# Patient Record
Sex: Female | Born: 1955
Health system: Southern US, Community
[De-identification: ages and names within clinical notes are randomized; demographics above are authoritative.]

## PROBLEM LIST (undated history)

## (undated) DIAGNOSIS — E1149 Type 2 diabetes mellitus with other diabetic neurological complication: Secondary | ICD-10-CM

## (undated) DIAGNOSIS — M65342 Trigger finger, left ring finger: Secondary | ICD-10-CM

## (undated) DIAGNOSIS — Z8489 Family history of other specified conditions: Secondary | ICD-10-CM

## (undated) DIAGNOSIS — Z8042 Family history of malignant neoplasm of prostate: Secondary | ICD-10-CM

## (undated) DIAGNOSIS — K759 Inflammatory liver disease, unspecified: Secondary | ICD-10-CM

## (undated) DIAGNOSIS — T8859XA Other complications of anesthesia, initial encounter: Secondary | ICD-10-CM

## (undated) DIAGNOSIS — D259 Leiomyoma of uterus, unspecified: Secondary | ICD-10-CM

## (undated) DIAGNOSIS — E119 Type 2 diabetes mellitus without complications: Secondary | ICD-10-CM

## (undated) DIAGNOSIS — E785 Hyperlipidemia, unspecified: Secondary | ICD-10-CM

## (undated) DIAGNOSIS — G4730 Sleep apnea, unspecified: Secondary | ICD-10-CM

## (undated) DIAGNOSIS — C801 Malignant (primary) neoplasm, unspecified: Secondary | ICD-10-CM

## (undated) DIAGNOSIS — I10 Essential (primary) hypertension: Secondary | ICD-10-CM

## (undated) DIAGNOSIS — Z808 Family history of malignant neoplasm of other organs or systems: Secondary | ICD-10-CM

## (undated) DIAGNOSIS — M7731 Calcaneal spur, right foot: Secondary | ICD-10-CM

## (undated) DIAGNOSIS — T4145XA Adverse effect of unspecified anesthetic, initial encounter: Secondary | ICD-10-CM

## (undated) DIAGNOSIS — J189 Pneumonia, unspecified organism: Secondary | ICD-10-CM

## (undated) DIAGNOSIS — M1990 Unspecified osteoarthritis, unspecified site: Secondary | ICD-10-CM

## (undated) HISTORY — DX: Unspecified osteoarthritis, unspecified site: M19.90

## (undated) HISTORY — DX: Sleep apnea, unspecified: G47.30

## (undated) HISTORY — DX: Essential (primary) hypertension: I10

## (undated) HISTORY — PX: CERVICAL BIOPSY  W/ LOOP ELECTRODE EXCISION: SUR135

## (undated) HISTORY — DX: Hyperlipidemia, unspecified: E78.5

## (undated) HISTORY — DX: Type 2 diabetes mellitus with other diabetic neurological complication: E11.49

## (undated) HISTORY — DX: Family history of malignant neoplasm of other organs or systems: Z80.8

## (undated) HISTORY — DX: Family history of malignant neoplasm of prostate: Z80.42

## (undated) HISTORY — DX: Morbid (severe) obesity due to excess calories: E66.01

---

## 1994-08-29 HISTORY — PX: TUBAL LIGATION: SHX77

## 2001-11-30 ENCOUNTER — Ambulatory Visit: Admission: RE | Admit: 2001-11-30 | Discharge: 2001-11-30 | Payer: Self-pay | Admitting: *Deleted

## 2001-12-13 ENCOUNTER — Encounter: Admission: RE | Admit: 2001-12-13 | Discharge: 2002-03-13 | Payer: Self-pay | Admitting: Pulmonary Disease

## 2003-04-16 ENCOUNTER — Ambulatory Visit (HOSPITAL_COMMUNITY)
Admission: RE | Admit: 2003-04-16 | Discharge: 2003-04-16 | Payer: Self-pay | Admitting: Rehabilitative and Restorative Service Providers"

## 2003-04-20 ENCOUNTER — Ambulatory Visit: Admission: RE | Admit: 2003-04-20 | Discharge: 2003-04-20 | Payer: Self-pay | Admitting: *Deleted

## 2004-11-19 ENCOUNTER — Ambulatory Visit: Payer: Self-pay | Admitting: Internal Medicine

## 2004-11-19 HISTORY — PX: ELECTROCARDIOGRAM: SHX264

## 2005-02-11 ENCOUNTER — Ambulatory Visit: Payer: Self-pay | Admitting: Internal Medicine

## 2005-02-18 ENCOUNTER — Ambulatory Visit: Payer: Self-pay | Admitting: Internal Medicine

## 2005-03-21 ENCOUNTER — Ambulatory Visit: Payer: Self-pay | Admitting: Endocrinology

## 2005-03-22 ENCOUNTER — Encounter: Admission: RE | Admit: 2005-03-22 | Discharge: 2005-06-20 | Payer: Self-pay | Admitting: Endocrinology

## 2005-04-21 ENCOUNTER — Ambulatory Visit: Payer: Self-pay | Admitting: Endocrinology

## 2005-05-04 ENCOUNTER — Ambulatory Visit: Payer: Self-pay | Admitting: Endocrinology

## 2005-05-18 ENCOUNTER — Ambulatory Visit: Payer: Self-pay | Admitting: Endocrinology

## 2005-05-18 ENCOUNTER — Ambulatory Visit: Payer: Self-pay | Admitting: Internal Medicine

## 2005-06-06 ENCOUNTER — Ambulatory Visit: Payer: Self-pay | Admitting: Endocrinology

## 2005-07-01 ENCOUNTER — Ambulatory Visit: Payer: Self-pay | Admitting: Endocrinology

## 2005-07-06 ENCOUNTER — Ambulatory Visit: Payer: Self-pay | Admitting: Endocrinology

## 2005-08-17 ENCOUNTER — Ambulatory Visit: Payer: Self-pay | Admitting: Internal Medicine

## 2005-08-30 ENCOUNTER — Ambulatory Visit: Payer: Self-pay | Admitting: Internal Medicine

## 2005-09-30 ENCOUNTER — Ambulatory Visit: Payer: Self-pay | Admitting: Internal Medicine

## 2005-10-05 ENCOUNTER — Ambulatory Visit: Payer: Self-pay | Admitting: Endocrinology

## 2006-01-18 ENCOUNTER — Ambulatory Visit: Payer: Self-pay | Admitting: Internal Medicine

## 2006-01-31 ENCOUNTER — Ambulatory Visit: Payer: Self-pay | Admitting: Endocrinology

## 2006-02-06 ENCOUNTER — Ambulatory Visit: Payer: Self-pay | Admitting: Endocrinology

## 2006-04-21 ENCOUNTER — Ambulatory Visit: Payer: Self-pay | Admitting: Internal Medicine

## 2006-04-26 ENCOUNTER — Ambulatory Visit: Payer: Self-pay | Admitting: Internal Medicine

## 2006-05-17 ENCOUNTER — Ambulatory Visit: Payer: Self-pay | Admitting: Endocrinology

## 2006-06-16 ENCOUNTER — Ambulatory Visit: Payer: Self-pay | Admitting: Endocrinology

## 2006-07-13 ENCOUNTER — Ambulatory Visit: Payer: Self-pay | Admitting: Endocrinology

## 2006-07-13 LAB — CONVERTED CEMR LAB
Creatinine,U: 167.4 mg/dL
Hgb A1c MFr Bld: 7.9 % — ABNORMAL HIGH (ref 4.6–6.0)
Microalb Creat Ratio: 2.4 mg/g (ref 0.0–30.0)
Microalb, Ur: 0.4 mg/dL (ref 0.0–1.9)

## 2006-12-11 ENCOUNTER — Ambulatory Visit: Payer: Self-pay | Admitting: Endocrinology

## 2006-12-11 LAB — CONVERTED CEMR LAB: Hgb A1c MFr Bld: 8 % — ABNORMAL HIGH (ref 4.6–6.0)

## 2007-01-30 ENCOUNTER — Ambulatory Visit: Payer: Self-pay | Admitting: Endocrinology

## 2007-02-28 ENCOUNTER — Encounter: Payer: Self-pay | Admitting: Endocrinology

## 2007-02-28 DIAGNOSIS — E1149 Type 2 diabetes mellitus with other diabetic neurological complication: Secondary | ICD-10-CM

## 2007-02-28 DIAGNOSIS — E785 Hyperlipidemia, unspecified: Secondary | ICD-10-CM

## 2007-02-28 DIAGNOSIS — I10 Essential (primary) hypertension: Secondary | ICD-10-CM

## 2007-02-28 DIAGNOSIS — E119 Type 2 diabetes mellitus without complications: Secondary | ICD-10-CM | POA: Insufficient documentation

## 2007-02-28 DIAGNOSIS — G4730 Sleep apnea, unspecified: Secondary | ICD-10-CM

## 2007-02-28 HISTORY — DX: Hyperlipidemia, unspecified: E78.5

## 2007-02-28 HISTORY — DX: Type 2 diabetes mellitus with other diabetic neurological complication: E11.49

## 2007-02-28 HISTORY — DX: Essential (primary) hypertension: I10

## 2007-02-28 HISTORY — DX: Morbid (severe) obesity due to excess calories: E66.01

## 2007-02-28 HISTORY — DX: Sleep apnea, unspecified: G47.30

## 2007-03-13 ENCOUNTER — Ambulatory Visit: Payer: Self-pay | Admitting: Endocrinology

## 2007-03-13 LAB — CONVERTED CEMR LAB
Hgb A1c MFr Bld: 7.9 % — ABNORMAL HIGH (ref 4.6–6.0)
Sed Rate: 24 mm/hr (ref 0–25)

## 2007-06-12 ENCOUNTER — Ambulatory Visit: Payer: Self-pay | Admitting: Endocrinology

## 2007-06-12 ENCOUNTER — Encounter: Payer: Self-pay | Admitting: Endocrinology

## 2007-06-12 LAB — CONVERTED CEMR LAB
Hgb A1c MFr Bld: 7.5 % — ABNORMAL HIGH (ref 4.6–6.0)
Sed Rate: 42 mm/hr — ABNORMAL HIGH (ref 0–25)

## 2007-08-20 ENCOUNTER — Encounter: Payer: Self-pay | Admitting: Endocrinology

## 2007-08-30 HISTORY — PX: DILATION AND CURETTAGE OF UTERUS: SHX78

## 2007-10-15 ENCOUNTER — Ambulatory Visit: Payer: Self-pay | Admitting: Endocrinology

## 2007-10-15 LAB — CONVERTED CEMR LAB: Hgb A1c MFr Bld: 8 % — ABNORMAL HIGH (ref 4.6–6.0)

## 2008-01-08 ENCOUNTER — Ambulatory Visit: Payer: Self-pay | Admitting: Endocrinology

## 2008-01-08 LAB — CONVERTED CEMR LAB: Hgb A1c MFr Bld: 7.6 % — ABNORMAL HIGH (ref 4.6–6.0)

## 2008-03-12 ENCOUNTER — Ambulatory Visit: Payer: Self-pay | Admitting: Endocrinology

## 2008-06-10 ENCOUNTER — Ambulatory Visit: Payer: Self-pay | Admitting: Endocrinology

## 2008-06-17 ENCOUNTER — Ambulatory Visit: Admission: AD | Admit: 2008-06-17 | Discharge: 2008-06-17 | Payer: Self-pay | Admitting: Obstetrics & Gynecology

## 2008-06-17 ENCOUNTER — Encounter (INDEPENDENT_AMBULATORY_CARE_PROVIDER_SITE_OTHER): Payer: Self-pay | Admitting: Obstetrics & Gynecology

## 2009-04-24 ENCOUNTER — Telehealth: Payer: Self-pay | Admitting: Endocrinology

## 2009-04-28 ENCOUNTER — Telehealth: Payer: Self-pay | Admitting: Internal Medicine

## 2009-05-21 ENCOUNTER — Telehealth: Payer: Self-pay | Admitting: Endocrinology

## 2009-05-28 ENCOUNTER — Ambulatory Visit: Payer: Self-pay | Admitting: Endocrinology

## 2009-05-29 LAB — CONVERTED CEMR LAB
ALT: 15 units/L (ref 0–35)
AST: 16 units/L (ref 0–37)
Albumin: 3.6 g/dL (ref 3.5–5.2)
Alkaline Phosphatase: 92 units/L (ref 39–117)
BUN: 21 mg/dL (ref 6–23)
Bilirubin, Direct: 0.1 mg/dL (ref 0.0–0.3)
CO2: 31 meq/L (ref 19–32)
Calcium: 10.5 mg/dL (ref 8.4–10.5)
Chloride: 101 meq/L (ref 96–112)
Cholesterol: 127 mg/dL (ref 0–200)
Creatinine, Ser: 1.1 mg/dL (ref 0.4–1.2)
GFR calc non Af Amer: 55.33 mL/min (ref >60)
Glucose, Bld: 199 mg/dL — ABNORMAL HIGH (ref 70–99)
HDL: 34.8 mg/dL — ABNORMAL LOW (ref >39.00)
Hgb A1c MFr Bld: 8.5 % — ABNORMAL HIGH (ref 4.6–6.5)
LDL Cholesterol: 76 mg/dL (ref 0–99)
Potassium: 3.8 meq/L (ref 3.5–5.1)
Sodium: 138 meq/L (ref 135–145)
Total Bilirubin: 0.4 mg/dL (ref 0.3–1.2)
Total CHOL/HDL Ratio: 4
Total Protein: 7.3 g/dL (ref 6.0–8.3)
Triglycerides: 80 mg/dL (ref 0.0–149.0)
VLDL: 16 mg/dL (ref 0.0–40.0)

## 2009-06-11 ENCOUNTER — Telehealth: Payer: Self-pay | Admitting: Endocrinology

## 2010-06-17 ENCOUNTER — Ambulatory Visit: Payer: Self-pay | Admitting: Endocrinology

## 2010-08-12 ENCOUNTER — Telehealth: Payer: Self-pay | Admitting: Endocrinology

## 2010-09-17 ENCOUNTER — Ambulatory Visit
Admission: RE | Admit: 2010-09-17 | Discharge: 2010-09-17 | Payer: Self-pay | Source: Home / Self Care | Attending: Endocrinology | Admitting: Endocrinology

## 2010-09-17 ENCOUNTER — Other Ambulatory Visit: Payer: Self-pay | Admitting: Endocrinology

## 2010-09-17 LAB — HEMOGLOBIN A1C: Hgb A1c MFr Bld: 7.5 % — ABNORMAL HIGH (ref 4.6–6.5)

## 2010-09-20 ENCOUNTER — Other Ambulatory Visit: Payer: Self-pay | Admitting: Obstetrics and Gynecology

## 2010-09-28 NOTE — Assessment & Plan Note (Signed)
Summary: 3 MTH FU---$50-STC   Vital Signs:  Patient Profile:   55 Years Old Female Weight:      351.8 pounds O2 Sat:      96 % O2 treatment:    Room Air Temp:     97.7 degrees F oral Pulse rate:   861 / minute BP sitting:   122 / 72  (left arm) Cuff size:   large  Pt. in pain?   no  Vitals Entered By: Orlan Leavens (June 10, 2008 9:13 AM)                  PCP:  [facility]nikov  Chief Complaint:  3 month follow-up.  History of Present Illness: pt says she feels occasionally as though her glucose is low, but not sure, in the afternoon.  she brings a record of her cbg's which i have reviewed today.  it ranges from 100-137.  no particular pattern throughout the day.    Prior Medications Reviewed Using: List Brought by Patient  Prior Medication List:  DILT-CD 180 MG  CP24 (DILTIAZEM HCL COATED BEADS) take 1 po ZOCOR 40 MG  TABS (SIMVASTATIN) take 1 po ZESTORETIC 20-12.5 MG  TABS (LISINOPRIL-HYDROCHLOROTHIAZIDE) TAKE 2 PO ADPRIN B 325 MG  TABS (ASPIRIN BUF(CACARB-MGCARB-MGO)) TAKE 1 PO DARVOCET-N 100   TABS (PROPOXYPHENE N-APAP TABS) TAKE 1 QID AS NEEDED NAPROXEN DR 500 MG  TBEC (NAPROXEN) TAKE 1 two times a day AS NEEDED NOVOLIN 70/30 70-30 %  SUSP (INSULIN ISOPHANE & REGULAR) 100 units qam  40 qpm   Updated Prior Medication List: DILT-CD 180 MG  CP24 (DILTIAZEM HCL COATED BEADS) take 1 po ZOCOR 40 MG  TABS (SIMVASTATIN) take 1 po ZESTORETIC 20-12.5 MG  TABS (LISINOPRIL-HYDROCHLOROTHIAZIDE) TAKE 2 PO ADPRIN B 325 MG  TABS (ASPIRIN BUF(CACARB-MGCARB-MGO)) TAKE 1 PO DARVOCET-N 100   TABS (PROPOXYPHENE N-APAP TABS) TAKE 1 QID AS NEEDED NAPROXEN DR 500 MG  TBEC (NAPROXEN) TAKE 1 two times a day AS NEEDED NOVOLIN 70/30 70-30 %  SUSP (INSULIN ISOPHANE & REGULAR) 100 units qam  40 qpm  Current Allergies: No known allergies   Past Medical History:    Reviewed history from 02/28/2007 and no changes required:       Diabetes mellitus, type I       Hypertension     Review of  Systems  The patient denies syncope.     Physical Exam  General:     obese.   Neck:     no masses, thyromegaly, or abnormal cervical nodes    Impression & Recommendations:  Problem # 1:  DIABETES MELLITUS, TYPE I (ICD-250.01)   Patient Instructions: 1)  a1c refused due to no health insurance. 2)  same rx 3)  ret 3 mos   Prescriptions: ZOCOR 40 MG  TABS (SIMVASTATIN) take 1 po  #90 x 3   Entered and Authorized by:   Minus Breeding MD   Signed by:   Minus Breeding MD on 06/10/2008   Method used:   Print then Give to Patient   RxID:   [id number]  ]

## 2010-09-28 NOTE — Progress Notes (Signed)
  Phone Note Refill Request Message from:  Fax from Pharmacy on April 24, 2009 4:22 PM  Refills Requested: Medication #1:  DILT-CD 180 MG  CP24 take 1 po Initial call taken by: Ami Bullins CMA,  April 24, 2009 4:22 PM    Prescriptions: DILT-CD 180 MG  CP24 (DILTIAZEM HCL COATED BEADS) take 1 po  #180 Each x 5   Entered by:   Ami Bullins CMA   Authorized by:   Minus Breeding MD   Signed by:   Bill Salinas CMA on 04/24/2009   Method used:   Electronically to        [facility] [phone]* (retail)       [address]       [place], [place]  [address]       Ph: [phone]       Fax: [phone]   RxID:   [id number]

## 2010-09-28 NOTE — Miscellaneous (Signed)
Summary: HUMALIN N   Clinical Lists Changes  Medications: Rx of HUMULIN N 100 UNIT/ML  SUSP (INSULIN ISOPHANE HUMAN) SEE OFFICE NOTES;  #15 [place] x 6;  Signed;  Entered by: Orlan Leavens;  Authorized by: Minus Breeding MD;  Method used: Electronic    Prescriptions: HUMULIN N 100 UNIT/ML  SUSP (INSULIN ISOPHANE HUMAN) SEE OFFICE NOTES  #15 [place] x 6   Entered by:   Orlan Leavens   Authorized by:   Minus Breeding MD   Signed by:   Orlan Leavens on 08/20/2007   Method used:   Electron[facility] sent to ...       [facility]  [address] [id number]       [address]       [place], [place]  [address]       Ph: [phone] or [phone]       Fax: [phone]   RxID:   [id number]

## 2010-09-28 NOTE — Assessment & Plan Note (Signed)
Summary: 3 MO ROV/NWS   Vital Signs:  Patient Profile:   55 Years Old Female Weight:      347.8 pounds Temp:     97.0 degrees F oral Pulse rate:   87 / minute BP sitting:   142 / 82  (right arm) Cuff size:   large  Vitals Entered By: Orlan Leavens (Jan 08, 2008 9:11 AM)                 PCP:  plotnikov  Chief Complaint:  3 MONTH FOLLOW-UP.  History of Present Illness: pt states hypoglycemia at hs and afternoon, so she reduced insulin to previous dosage.  says her lowest glucose was 58.  no cbg record.  is soon to be laid off from her job, so wants to change to cheaper insulin then.    Current Allergies: No known allergies   Past Medical History:    Reviewed history from 02/28/2007 and no changes required:       Diabetes mellitus, type I       Hypertension     Review of Systems  The patient denies syncope.     Physical Exam  General:     normal appearance.   Psych:     alert and cooperative; normal mood and affect; normal attention span and concentration Additional Exam:     A1C%                 [H]  7.6 %       Impression & Recommendations:  Problem # 1:  DIABETES MELLITUS, TYPE I (ICD-250.01) therapy limited by pt's request for least expensive meds  The following medications were removed from the medication list:    Humalog 100 Unit/ml Soln (Insulin lispro (human)) ..... See office notes    Humulin N 100 Unit/ml Susp (Insulin isophane human) ..... See office notes  Her updated medication list for this problem includes:    Novolin 70/30 70-30 % Susp (Insulin isophane & regular) .[name]... 90 units qam  30 qpm  Orders: TLB-A1C / Hgb A1C (Glycohemoglobin) (83036-A1C) Est. Patient Level III (32440)   Medications Added to Medication List This Visit: 1)  Novolin 70/30 70-30 % Susp (Insulin isophane & regular) .... 90 units qam  30 qpm   Patient Instructions: 1)  change both insulins to novolin 70/30 90 qam and 30 qpm 2)  ret  30d   Prescriptions: NOVOLIN 70/30 70-30 %  SUSP (INSULIN ISOPHANE & REGULAR) 90 units qam  30 qpm  #90 x 30   Entered and Authorized by:   Minus Breeding MD   Signed by:   Minus Breeding MD on 01/08/2008   Method used:   Electronically sent to ...       [facility]  [address] [id number]       [address]       [place], [place]  [address]       Ph: [phone] or [phone]       Fax: [phone]   RxID:   [id number]  ]

## 2010-09-28 NOTE — Assessment & Plan Note (Signed)
Summary: 1 MO FU/$50/PN   Vital Signs:  Patient Profile:   55 Years Old Female Weight:      350.0 pounds Temp:     97.3 degrees F oral Pulse rate:   82 / minute BP sitting:   131 / 71  (left arm) Cuff size:   large  Pt. in pain?   no  Vitals Entered By: Orlan Leavens (March 12, 2008 8:55 AM)                  PCP:  plotnikov  Chief Complaint:  1 month follow-up.  History of Present Illness: pt likes her new insulin.  cbg's are alomost all mid-100's, with no trend throughout the day. ffels well, except for economic difficulty.    Current Allergies: No known allergies   Past Medical History:    Reviewed history from 02/28/2007 and no changes required:       Diabetes mellitus, type I       Hypertension     Review of Systems  The patient denies hypoglycemia.     Physical Exam  General:     obese.   Extremities:     trace left pedal edema and trace right pedal edema.      Impression & Recommendations:  Problem # 1:  DIABETES MELLITUS, TYPE I (ICD-250.01)  Medications Added to Medication List This Visit: 1)  Novolin 70/30 70-30 % Susp (Insulin isophane & regular) .[name].. 100 units qam  40 qpm   Patient Instructions: 1)  increase novolin 70/30 to 100 am and 40 pm 2)  ret 3 mos   Prescriptions: NOVOLIN 70/30 70-30 %  SUSP (INSULIN ISOPHANE & REGULAR) 100 units qam  40 qpm  #5 x 11   Entered and Authorized by:   Minus Breeding MD   Signed by:   Minus Breeding MD on 03/12/2008   Method used:   Electronically sent to ...       [facility]  [address] [id number]       [address]       [place], [place]  [address]       Ph: [phone] or [phone]       Fax: [phone]   RxID:   [id number]  ]

## 2010-09-28 NOTE — Assessment & Plan Note (Signed)
Summary: FU---MED---STC   Vital Signs:  Patient profile:   55 year old female Height:      65 inches (165.10 cm) Weight:      342.25 pounds (155.57 kg) BMI:     57.16 O2 Sat:      98 % on Room air Temp:     97.9 degrees F (36.61 degrees C) oral Pulse rate:   70 / minute BP sitting:   130 / 72  (left arm) Cuff size:   large  Vitals Entered By: Brenton Grills MA (June 17, 2010 8:08 AM)  O2 Flow:  Room air CC: Follow up to refill meds/aj Is Patient Diabetic? Yes   Primary Provider:  plotnikov  CC:  Follow up to refill meds/aj.  History of Present Illness: the status of at least 3 ongoing medical problems is addressed today: dyslipidemia:  she tolerates zocor well, but she now has insurance, and says she can afford medication.  there is an interaction between zocor and diltiazem. htn:  pt states she feels well in general.  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  she has hypoglycemia approx 1/month, and these episodes are mild.  she says cbg's are mostly in the 100's.  it is higher in am than later in the day.   Current Medications (verified): 1)  Zocor 40 Mg  Tabs (Simvastatin) .... Take 1 Po 2)  Zestoretic 20-12.5 Mg  Tabs (Lisinopril-Hydrochlorothiazide) .... Take 2 Po 3)  Adprin B 325 Mg  Tabs (Aspirin Buf(Cacarb-Mgcarb-Mgo)) .... Take 1 Po 4)  Relion 70/30 70-30 % Susp (Insulin Isophane & Regular) .[name].. 110 Units Am and 50 Units Pm 5)  Diltiazem Hcl 120 Mg Tabs (Diltiazem Hcl) .[name].. 1 Tid  Allergies (verified): No Known Drug Allergies  Past History:  Past Medical History: Last updated: 05/28/2009 Diabetes mellitus, type I Hypertension Current Problems:  MORBID OBESITY (ICD-278.01) SLEEP APNEA (ICD-780.57) DIABETIC PERIPHERAL NEUROPATHY (ICD-250.60) DYSLIPIDEMIA (ICD-272.4) HYPERTENSION (ICD-401.9) DIABETES MELLITUS, TYPE I (ICD-250.01)  Review of Systems  The patient denies weight loss, weight gain, chest pain, and  dyspnea on exertion.    Physical Exam  General:  morbidly obese.  no distress  Pulses:  dorsalis pedis intact bilat.  Extremities:  no deformity.  no ulcer on the feet.  feet are of normal color and temp.  no edema  Neurologic:  sensation is intact to touch on the feet    Impression & Recommendations:  Problem # 1:  DIABETES MELLITUS, TYPE I (ICD-250.01) uncertain control  Problem # 2:  HYPERTENSION (ICD-401.9) well-controlled  Problem # 3:  DYSLIPIDEMIA (ICD-272.4) she can take a safer med now  Medications Added to Medication List This Visit: 1)  Zestoretic 20-12.5 Mg Tabs (Lisinopril-hydrochlorothiazide) .... Take 2 once daily 2)  Lipitor 40 Mg Tabs (Atorvastatin calcium) .[name].. 1 tab once daily 3)  Diltiazem Hcl Er Beads 300 Mg Xr24h-cap (Diltiazem hcl er beads) .[name].. 1 tab once daily  Other Orders: Est. Patient Level IV (21308)  Patient Instructions: 1)  check your blood sugar 2 times a day.  vary the time of day when you check, between before the 3 meals, and at bedtime.  also check if you have symptoms of your blood sugar being too high or too low.  please keep a record of the readings and bring it to your next appointment here.  please call us sooner if you are having low blood sugar episodes. 2)  tests are being ordered  for you today.  a few days after the test(s), please call [phone] to hear your test results. 3)  pending the test results, please continue the same medications for now 4)  Please schedule a follow-up appointment in 3 months. 5)  change simvastatin to lipitor 40 mg once daily 6)  you should also see dr Posey Rea for a physical soon.  7)  change diltiazem to -xr, 300 mg once daily.   Orders Added: 1)  Est. Patient Level IV [78295]

## 2010-09-28 NOTE — Progress Notes (Signed)
Summary: Diltizem  Phone Note From Pharmacy   Summary of Call: Pharm called about pts diltizem. Previously pt has been on 1 two times a day. Rx was sent in incomplete. Please advise, 1 two times a day?  Initial call taken by: Lamar Sprinkles, CMA,  April 28, 2009 10:34 AM  Follow-up for Phone Call        Pls ask the pt - if she was on two times a day - cont bid Follow-up by: Tresa Garter MD,  April 28, 2009 1:04 PM  Additional Follow-up for Phone Call Additional follow up Details #1::        For the record, this was the first call from the pharm with a question about med. Pt recieved rx today but is said 1 once daily #30. She has been taking med 1 two times a day. New rx sent into pharm, Pt informed to continue dose she was taking Additional Follow-up by: Lamar Sprinkles, CMA,  April 28, 2009 6:08 PM    New/Updated Medications: DILT-CD 180 MG  CP24 (DILTIAZEM HCL COATED BEADS) take 1 by mouth bid Prescriptions: DILT-CD 180 MG  CP24 (DILTIAZEM HCL COATED BEADS) take 1 by mouth bid  #180 x 0   Entered by:   Lamar Sprinkles, CMA   Authorized by:   Tresa Garter MD   Signed by:   Lamar Sprinkles, CMA on 04/28/2009   Method used:   Electronically to        [facility] [phone]* (retail)       [address]       [place], [place]  [address]       Ph: [phone]       Fax: [phone]   RxID:   [id number]

## 2010-09-28 NOTE — Assessment & Plan Note (Signed)
Summary: INSULIN BEING CHANGED-ROV-STC   Vital Signs:  Patient profile:   55 year old female Height:      65 inches Weight:      342 pounds BMI:     57.12 O2 Sat:      97 % on Room air Temp:     98.2 degrees F oral Pulse rate:   96 / minute BP sitting:   122 / 88  (left arm) Cuff size:   large  Vitals Entered By: Bill Salinas CMA (May 28, 2009 8:36 AM)  O2 Flow:  Room air CC: Stacey Wise here to discuss change in insulin per her pharm/ Stacey Wise no longer taking Darvocet or Naproxen/ ab   Primary Provider:  plotnikov  CC:  Stacey Wise here to discuss change in insulin per her pharm/ Stacey Wise no longer taking Darvocet or Naproxen/ ab.  History of Present Illness: she takes zocor as rx'ed no cbg record, but states cbg's are well-controlled. she takes diltiazem and zestoretic as rx'ed   Current Medications (verified): 1)  Dilt-Cd 180 Mg  Cp24 (Diltiazem Hcl Coated Beads) .... Take 1 By Mouth Bid 2)  Zocor 40 Mg  Tabs (Simvastatin) .... Take 1 Po 3)  Zestoretic 20-12.5 Mg  Tabs (Lisinopril-Hydrochlorothiazide) .... Take 2 Po 4)  Adprin B 325 Mg  Tabs (Aspirin Buf(Cacarb-Mgcarb-Mgo)) .... Take 1 Po 5)  Darvocet-N 100   Tabs (Propoxyphene N-Apap Tabs) .... Take 1 Qid As Needed 6)  Naproxen Dr 500 Mg  Tbec (Naproxen) .... Take 1 Two Times A Day As Needed 7)  Novolin 70/30 70-30 %  Susp (Insulin Isophane & Regular) .[name].. 100 Units Qam  40 Qpm  Allergies (verified): No Known Drug Allergies  Past History:  Past Medical History: Diabetes mellitus, type I Hypertension Current Problems:  MORBID OBESITY (ICD-278.01) SLEEP APNEA (ICD-780.57) DIABETIC PERIPHERAL NEUROPATHY (ICD-250.60) DYSLIPIDEMIA (ICD-272.4) HYPERTENSION (ICD-401.9) DIABETES MELLITUS, TYPE I (ICD-250.01)  Family History: Reviewed history and no changes required. no dm  Social History: Reviewed history and no changes required. unemployed single.  Review of Systems  The patient denies weight loss, weight gain, and chest pain.     Physical Exam  General:  morbidly obese.  no distress  Pulses:  dorsalis pedis intact bilat.  Extremities:  no deformity.  no ulcer on the feet.  feet are of normal color and temp.  no edema  Neurologic:  sensation is intact to touch on the feet  Additional Exam:  LDL Cholesterol           76 mg/dL Hemoglobin J8J       [H]  8.5 %   Impression & Recommendations:  Problem # 1:  DIABETES MELLITUS, TYPE I (ICD-250.01) needs increased rx  Problem # 2:  HYPERTENSION (ICD-401.9) well-controlled  Problem # 3:  DYSLIPIDEMIA (ICD-272.4) well-controlled  Medications Added to Medication List This Visit: 1)  Relion 70/30 70-30 % Susp (Insulin isophane & regular) .[name].. 100 units am and 40 units pm 2)  Relion 70/30 70-30 % Susp (Insulin isophane & regular) .[name].. 110 units am and 50 units pm 3)  Diltiazem Hcl 120 Mg Tabs (Diltiazem hcl) .[name].. 1 tid  Other Orders: Est. Patient Level III (19147) TLB-Lipid Panel (80061-LIPID) TLB-Hepatic/Liver Function Pnl (80076-HEPATIC) TLB-BMP (Basic Metabolic Panel-BMET) (80048-METABOL) TLB-A1C / Hgb A1C (Glycohemoglobin) (83036-A1C) Est. Patient Level III (82956)  Patient Instructions: 1)  check your blood sugar every other day.  vary the time of day when you check, between before the 3 meals, and at bedtime.  also  check if you have symptoms of your blood sugar being too high or too low.  please keep a record of the readings and bring it to your next appointment here.  please call us sooner if you are having low blood sugar episodes. 2)  change diltiazem to 120 mg three times a day 3)  otherwise same meds for now. 4)  tests are being ordered for you today.  a few days after the test(s), please call [phone] to hear your test results. 5)  i gave written rx's for all meds, to let her shop for the best price 6)  i told Stacey Wise that important health measures (such as a cardiogram) are being skipped in order to save her money.  please let me know if you decide  to have these done. 7)  Please schedule a follow-up appointment in 3 months. 8)  (update: increase insulin to 110 units am and 50 units pm) Prescriptions: DILTIAZEM HCL 120 MG TABS (DILTIAZEM HCL) 1 tid  #270 x 3   Entered and Authorized by:   Minus Breeding MD   Signed by:   Minus Breeding MD on 05/28/2009   Method used:   Print then Give to Patient   RxID:   [id number] ZESTORETIC 20-12.5 MG  TABS (LISINOPRIL-HYDROCHLOROTHIAZIDE) TAKE 2 PO  #180 x 3   Entered and Authorized by:   Minus Breeding MD   Signed by:   Minus Breeding MD on 05/28/2009   Method used:   Print then Give to Patient   RxID:   [id number] ZOCOR 40 MG  TABS (SIMVASTATIN) take 1 po  #90 x 3   Entered and Authorized by:   Minus Breeding MD   Signed by:   Minus Breeding MD on 05/28/2009   Method used:   Print then Give to Patient   RxID:   [id number] RELION 70/30 70-30 % SUSP (INSULIN ISOPHANE & REGULAR) 100 units am and 40 units pm  #5 vials x 11   Entered and Authorized by:   Minus Breeding MD   Signed by:   Minus Breeding MD on 05/28/2009   Method used:   Print then Give to Patient   RxID:   [id number]

## 2010-09-28 NOTE — Assessment & Plan Note (Signed)
Summary: 4 MO ROV/NWS      Allergies Added: NKDA  Vital Signs:  Patient Profile:   55 Years Old Female Weight:      349.6 pounds Temp:     99.0 degrees F oral Pulse rate:   78 / minute BP sitting:   122 / 66  (right arm) Cuff size:   large  Vitals Entered By: Orlan Leavens (October 15, 2007 9:25 AM)                 Visit Type:  Follow-up Visit PCP:  plotnikov  Chief Complaint:  dm.  History of Present Illness: pt states she takes insulin as advised.  cbg's are highest before lunch.  no cbg record.    Current Allergies: No known allergies   Past Medical History:    Reviewed history from 02/28/2007 and no changes required:       Diabetes mellitus, type I       Hypertension     Review of Systems       denies hypoglycemia   Physical Exam  Skin:     insulin injection sites (and abdomen) normal Additional Exam:     a1c=8.0    Impression & Recommendations:  Problem # 1:  DIABETES MELLITUS, TYPE I (ICD-250.01)  Her updated medication list for this problem includes:    Humalog 100 Unit/ml Soln (Insulin lispro (human)) ..... See office notes    Humulin N 100 Unit/ml Susp (Insulin isophane human) ..... See office notes  Orders: TLB-A1C / Hgb A1C (Glycohemoglobin) (83036-A1C) Est. Patient Level III (04540)    Patient Instructions: 1)  same nph (30-qhs) 2)  increase qac humalog to 65-15-40 3)  ret 3 mos    ]

## 2010-09-28 NOTE — Progress Notes (Signed)
Summary: A1C  Phone Note Call from Patient Call back at Home Phone [phone]   Caller: Patient Summary of Call: pt called stating that pharmacy is switching insulin due to insurance and pt insurance requires A1C before they change. pt states that she cannot afford OV at this time Initial call taken by: Margaret Pyle, CMA,  May 21, 2009 9:40 AM  Follow-up for Phone Call        other than refilling insulin (which was done a few weeks ago) there is nothing i can do without office visit.  also, we cannot continue to refill without office visit. Follow-up by: Minus Breeding MD,  May 21, 2009 12:06 PM  Additional Follow-up for Phone Call Additional follow up Details #1::        pt is informed Additional Follow-up by: Margaret Pyle, CMA,  May 21, 2009 1:21 PM

## 2010-09-28 NOTE — Progress Notes (Signed)
Summary: rx request  Phone Note Call from Patient Call back at Home Phone [phone]   Caller: Patient Summary of Call: pt called request rx for insulin be called into pharmacy. MD change dose on last rx given so pt need new rx. Initial call taken by: Margaret Pyle, CMA,  June 11, 2009 2:24 PM    Prescriptions: RELION 70/30 70-30 % SUSP (INSULIN ISOPHANE & REGULAR) 110 units am and 50 units pm  #5 vials x 11   Entered by:   Margaret Pyle, CMA   Authorized by:   Minus Breeding MD   Signed by:   Margaret Pyle, CMA on 06/11/2009   Method used:   Electronically to        [facility] [address] (retail)       [phone]       [place], [place]  [address]       Ph: [phone]       Fax: [phone]   RxID:   [id number]

## 2010-09-28 NOTE — Assessment & Plan Note (Signed)
    Vital Signs:  Patient Profile:   55 Years Old Female Weight:      346 pounds Temp:     97.9 degrees F Pulse rate:   84 / minute BP sitting:   119 / 71                 Visit Type:  f/u PCP:  plotnikov  Chief Complaint:  dm.  History of Present Illness: patient states her glucoses continue to improve.  They are lowest in the afternoon, and sometimes at bed time.  She feels that this is contributed to by the fact that she works second shift and gets only a very small break to the dinner.  Her glucoses are highest in the morning, which may be due to eating a light snack at bedtime.she says her glucoses her highest between breakfast and lunch.   Vital Signs:  Patient Profile:   55 Years Old Female Weight:      346 pounds Temp:     97.9 degrees F Pulse rate:   84 / minute BP sitting:   119 / 71   Current Allergies: No known allergies   Past Medical History:    Reviewed history from 02/28/2007 and no changes required:       Diabetes mellitus, type I       Hypertension      Physical Exam  General:     obese.   Extremities:     no clubbing, cyanosis, edema, or deformity noted with normal full range of motion of all joints trace left pedal edema and trace right pedal edema.  she has onychomycosis on both great toenails     Impression & Recommendations:  Problem # 1:  DIABETES MELLITUS, TYPE I (ICD-250.01)  Her updated medication list for this problem includes:    Humalog 100 Unit/ml Soln (Insulin lispro (human)) ..... See office notes    Humulin N 100 Unit/ml Susp (Insulin isophane human) ..... See office notes HgbA1C: 7.9 (03/13/2007) a1c=7.5 (06/12/07)   Patient Instructions: 1)  increase nph to 30 at bedtime 2)  (as an alternative, you may take 5 units humalog with hs snack, if you choose to eat one) 3)  same qac humalog (55-15-40) 4)  Please schedule a follow-up appointment in 3 months.    ]

## 2010-09-30 NOTE — Progress Notes (Signed)
Summary: Rx refill req  Phone Note Refill Request Message from:  Patient on August 12, 2010 3:41 PM  Refills Requested: Medication #1:  LIPITOR 40 MG TABS 1 tab once daily   Dosage confirmed as above?Dosage Confirmed   Supply Requested: 6 months  Method Requested: Electronic Initial call taken by: Margaret Pyle, CMA,  August 12, 2010 3:41 PM    Prescriptions: LIPITOR 40 MG TABS (ATORVASTATIN CALCIUM) 1 tab once daily  #90 x 1   Entered by:   Margaret Pyle, CMA   Authorized by:   Minus Breeding MD   Signed by:   Margaret Pyle, CMA on 08/12/2010   Method used:   Electronically to        [facility] [phone]* (retail)       [address]       [place], [place]  [address]       Ph: [phone]       Fax: [phone]   RxID:   [id number]

## 2010-09-30 NOTE — Assessment & Plan Note (Signed)
Summary: 3 MO ROV /NWS  #   Vital Signs:  Patient profile:   55 year old female Height:      65 inches (165.10 cm) Weight:      341.38 pounds (155.17 kg) BMI:     57.01 O2 Sat:      96 % on Room air Temp:     98.9 degrees F (37.17 degrees C) oral Pulse rate:   84 / minute Pulse rhythm:   regular BP sitting:   128 / 72  (left arm) Cuff size:   large  Vitals Entered By: Brenton Grills CMA Duncan Dull) (September 17, 2010 9:27 AM)  O2 Flow:  Room air CC: 3 month F/U/aj Is Patient Diabetic? Yes   Primary Provider:  plotnikov  CC:  3 month F/U/aj.  History of Present Illness: pt states she feels well in general.  she is now employed again.  no cbg record, but states cbg was low once, after an unexpectedly small meal.  she did not check cbg then, but she had diaphoresis.  she says this was a rare occurrence.  she says cbg's are high-100's in the evening, and lower in am.  Current Medications (verified): 1)  Zestoretic 20-12.5 Mg  Tabs (Lisinopril-Hydrochlorothiazide) .... Take 2 Once Daily 2)  Adprin B 325 Mg  Tabs (Aspirin Buf(Cacarb-Mgcarb-Mgo)) .... Take 1 Po 3)  Relion 70/30 70-30 % Susp (Insulin Isophane & Regular) .[name].. 110 Units Am and 50 Units Pm 4)  Lipitor 40 Mg Tabs (Atorvastatin Calcium) .[name].. 1 Tab Once Daily 5)  Diltiazem Hcl Er Beads 300 Mg Xr24h-Cap (Diltiazem Hcl Er Beads) .[name].. 1 Tab Once Daily  Allergies (verified): No Known Drug Allergies  Past History:  Past Medical History: Last updated: 05/28/2009 Diabetes mellitus, type I Hypertension Current Problems:  MORBID OBESITY (ICD-278.01) SLEEP APNEA (ICD-780.57) DIABETIC PERIPHERAL NEUROPATHY (ICD-250.60) DYSLIPIDEMIA (ICD-272.4) HYPERTENSION (ICD-401.9) DIABETES MELLITUS, TYPE I (ICD-250.01)  Family History: Reviewed history from 05/28/2009 and no changes required. dm: mother and 2 sibs  Social History: Reviewed history from 05/28/2009 and no changes required. 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  Review of Systems  The patient denies syncope.    Physical Exam  General:  morbidly obese.  no distress  Pulses:  dorsalis pedis intact bilat.  Extremities:  no deformity.  no ulcer on the feet.  feet are of normal color and temp.  no edema  Neurologic:  sensation is intact to touch on the feet.  Additional Exam:  Hemoglobin A1C       [H]  7.5 %     Impression & Recommendations:  Problem # 1:  DIABETES MELLITUS, TYPE I (ICD-250.01) this is the best control this pt should aim for, given this regimen, which does match insulin to her changing needs throughout the day  Other Orders: TLB-A1C / Hgb A1C (Glycohemoglobin) (83036-A1C) Est. Patient Level III (88416)  Patient Instructions: 1)  check your blood sugar 2 times a day.  vary the time of day when you check, between before the 3 meals, and at bedtime.  also check if you have symptoms of your blood sugar being too high or too low.  please keep a record of the readings and bring it to your next appointment here.  please call us sooner if you are having low blood sugar episodes. 2)  tests are being ordered for you today.  a few days after the test(s), please call [phone] to hear your test results. 3)  pending the test results,  please continue the same insulin for now 4)  Please schedule a follow-up appointment in 3 months. 5)  on this type of insulin schedule, meals shold not be missed or delayed. 6)  (update: i left message on phone-tree:  rx as we discussed)   Orders Added: 1)  TLB-A1C / Hgb A1C (Glycohemoglobin) [83036-A1C] 2)  Est. Patient Level III [78295]   Immunization History:  Influenza Immunization History:    Influenza:  historical (07/29/2010)   Immunization History:  Influenza Immunization History:    Influenza:  Historical (07/29/2010)

## 2010-10-21 ENCOUNTER — Encounter (HOSPITAL_COMMUNITY): Payer: Self-pay

## 2010-10-21 ENCOUNTER — Encounter (HOSPITAL_COMMUNITY)
Admission: RE | Admit: 2010-10-21 | Discharge: 2010-10-21 | Disposition: A | Discharge status: Home / Self Care | Payer: BC Managed Care – PPO | Source: Ambulatory Visit | Attending: Obstetrics and Gynecology | Admitting: Obstetrics and Gynecology

## 2010-10-21 DIAGNOSIS — Z01810 Encounter for preprocedural cardiovascular examination: Secondary | ICD-10-CM | POA: Insufficient documentation

## 2010-10-21 DIAGNOSIS — Z01818 Encounter for other preprocedural examination: Secondary | ICD-10-CM | POA: Insufficient documentation

## 2010-10-21 DIAGNOSIS — Z01812 Encounter for preprocedural laboratory examination: Secondary | ICD-10-CM | POA: Insufficient documentation

## 2010-10-21 LAB — CBC
HCT: 41.1 % (ref 36.0–46.0)
Hemoglobin: 13 g/dL (ref 12.0–15.0)
MCH: 26.5 pg (ref 26.0–34.0)
MCHC: 31.6 g/dL (ref 30.0–36.0)
MCV: 83.7 fL (ref 78.0–100.0)
Platelets: 311 10*3/uL (ref 150–400)
RBC: 4.91 MIL/uL (ref 3.87–5.11)
RDW: 15 % (ref 11.5–15.5)
WBC: 13.9 10*3/uL — ABNORMAL HIGH (ref 4.0–10.5)

## 2010-10-21 LAB — BASIC METABOLIC PANEL
BUN: 20 mg/dL (ref 6–23)
CO2: 29 mEq/L (ref 19–32)
Calcium: 10.7 mg/dL — ABNORMAL HIGH (ref 8.4–10.5)
Chloride: 106 mEq/L (ref 96–112)
Creatinine, Ser: 1.06 mg/dL (ref 0.4–1.2)
GFR calc Af Amer: >60 mL/min (ref >60)
GFR calc non Af Amer: 54 mL/min — ABNORMAL LOW (ref >60)
Glucose, Bld: 133 mg/dL — ABNORMAL HIGH (ref 70–99)
Potassium: 3.5 mEq/L (ref 3.5–5.1)
Sodium: 140 mEq/L (ref 135–145)

## 2010-10-29 ENCOUNTER — Other Ambulatory Visit: Payer: Self-pay | Admitting: Obstetrics and Gynecology

## 2010-10-29 ENCOUNTER — Ambulatory Visit (HOSPITAL_COMMUNITY)
Admission: RE | Admit: 2010-10-29 | Discharge: 2010-10-29 | Disposition: A | Discharge status: Home / Self Care | Payer: BC Managed Care – PPO | Source: Ambulatory Visit | Attending: Obstetrics and Gynecology | Admitting: Obstetrics and Gynecology

## 2010-10-29 DIAGNOSIS — E119 Type 2 diabetes mellitus without complications: Secondary | ICD-10-CM | POA: Insufficient documentation

## 2010-10-29 DIAGNOSIS — I10 Essential (primary) hypertension: Secondary | ICD-10-CM | POA: Insufficient documentation

## 2010-10-29 DIAGNOSIS — R87619 Unspecified abnormal cytological findings in specimens from cervix uteri: Secondary | ICD-10-CM | POA: Insufficient documentation

## 2010-10-29 LAB — GLUCOSE, CAPILLARY
Glucose-Capillary: 91 mg/dL (ref 70–99)
Glucose-Capillary: 68 mg/dL — ABNORMAL LOW (ref 70–99)

## 2010-11-08 NOTE — Op Note (Signed)
NAMEOVAL, MORALEZ             ACCOUNT NO.:  [id number]  MEDICAL RECORD NO.:  [id number]           PATIENT TYPE:  O  LOCATION:  [facility]                          [facility]ILITY:  [facility]  PHYSICIAN:  Niharika Savino H. Tenny Craw, MD     DATE OF BIRTH:  Mar 26, 1956  DATE OF PROCEDURE:  10/29/2010 DATE OF DISCHARGE:                              OPERATIVE REPORT   PREOPERATIVE DIAGNOSES: 1. Persistent abnormal Pap smears. 2. Unsatisfactory colposcopy. 3. Morbid obesity.  POSTOPERATIVE DIAGNOSES: 1. Persistent abnormal Pap smears. 2. Unsatisfactory colposcopy. 3. Morbid obesity.  PROCEDURE:  Exam under anesthesia with a LEEP procedure.  SURGEON:  Freddrick March. Tenny Craw, MD  ASSISTANT:  None.  ANESTHESIA:  MAC.  OPERATIVE FINDINGS:  Elongated narrow vagina, irregularly nonstaining areas on the surface of the cervix with Lugol.  SPECIMENS:  LEEP specimens.  First passes from 3 to 9, second passes deep pass.  DISPOSITION:  Specimens to Pathology.  ESTIMATED BLOOD LOSS:  Minimal.  COMPLICATIONS:  None.  PROCEDURE:  Ms. Alcoser is a 55 year old nulligravida African American female who has had a history of persistent consecutive abnormal Pap smears.  She has had 2 ASCUS Pap smears with high-risk HPV separated by 6 months, followed by a low-grade squamous intraepithelial lesion Pap smear, 6 months after her second ASCUS Pap.  A colonoscopy was attempted in the office, however, given her morbid obesity after adequate visualization of her cervix was not possible given her age at 51 and her habitus, a decision was made to proceed with exam under anesthesia and a LEEP procedure in the operating room for both diagnostic and therapeutic purposes.  Following the appropriate informed consent, the patient was brought to the operating room where MAC anesthesia was administered and was found to be adequate.  She was placed in the lithotomy position. The perineum was prepped and draped in the normal fashion.   Speculum was placed into the vagina in spite of a surgical bed and adequate anesthesia, adequate visualization of the cervix was still difficult due to the patient's narrow elongated vagina and anterior cervix, 10 mL of 1% lidocaine was 1:100,000 epinephrine were injected.  The cervix was then stained with Lugol solution and noted to have irregular staining of the surface of the cervix.  The port uptake of Lugol was more on the lateral portion of the cervix.  A first pass with the LEEP was performed with removal of tissue from 3 to 9 o'clock, and the specimen was tagged at 12 o'clock, and then a second deep pass was performed which removed a full circumference of the cervix.  The Bovie was then used to cauterize the remaining excised portion of the cervix.  Bleeding was minimal at the end of the procedure.  The patient tolerated the procedure well. The speculum was removed from the vagina.  The patient was taken out of lithotomy.  MAC was reversed, and the patient was brought to the recovery room in stable condition following the procedure.     Freddrick March. Tenny Craw, MD     KHR/MEDQ  D:  10/29/2010  T:  10/30/2010  Job:  [id number]  Electronically Signed  by Waynard Reeds MD on 11/08/2010 06:15:01 PM

## 2010-11-16 ENCOUNTER — Other Ambulatory Visit: Payer: Self-pay | Admitting: Endocrinology

## 2011-01-11 NOTE — Op Note (Signed)
[name], Stacey Wise             ACCOUNT NO.:  [id number]   MEDICAL RECORD NO.:  [id number]          PATIENT TYPE:  AMB   LOCATION:  [facility]                          [facility]   PHYSICIAN:  Ilda Mori, M.D.   DATE OF BIRTH:  1956/03/27   DATE OF PROCEDURE:  06/17/2008  DATE OF DISCHARGE:                               OPERATIVE REPORT   PREOPERATIVE DIAGNOSIS:  Heavy postmenopausal bleeding.   POSTOPERATIVE DIAGNOSIS:  Heavy postmenopausal bleeding.   PROCEDURE:  Dilatation and curettage.   SURGEON:  Ilda Mori, MD   ANESTHESIA:  IV sedation with paracervical block.   ESTIMATED BLOOD LOSS:  30 mL.   FINDINGS:  The cervix appeared totally normal.  The internal os was  easily dilated to 21-French.  The uterus sounded to 8-cm; scant amount  of endometrial tissue was obtained.   COMPLICATIONS:  None.   INDICATIONS:  This is a 55 year old nulligravid female who presented to  the office with a week's worth of moderately heavy vaginal bleeding.  On  evaluation in the office, the cervix was difficult to visualize and  moderate-to-large amount of bleeding was noted to be present.  Because  of the heavy vaginal bleeding, it was felt that emergency D&C was  necessary for diagnosis and treatment.   PROCEDURE:  The patient was taken to the operating room and placed in  the dorsal lithotomy position.  IV sedation was administered.  The 10 mL  of 2% lidocaine was infiltrated in the paracervical tissues.  The uterus  sounded to 8 cm.  A #21-French Pratt dilator was introduced without  resistance.  A #7 suction curette was introduced and only scant tissue  was obtained.  A Tiemanns serrated curette was then introduced and the  endometrium was systematically and carefully curettaged.  This tissue  was collected on Telfa sponges, as well as with resuctioning of the  vaginal pool and the endometrium.  A polyp forceps were introduced and  no polyps were found.  Hysteroscopy was not  performed because of the  amount of vaginal bleeding that was occurring.  This vaginal bleeding  did subside after the D&C to a certain extent.  The patient's  preoperative hemoglobin was 14, and this was very reassuring that the  vaginal bleeding was not as significant as had been felt preoperatively.  The procedure was then terminated and the patient left the operating  room in good condition.     Ilda Mori, M.D.  Electronically Signed    RK/MEDQ  D:  06/17/2008  T:  06/18/2008  Job:  [id number]

## 2011-01-14 NOTE — Procedures (Signed)
NAME:  Stacey Wise, Stacey Wise                         ACCOUNT NO.:  [id number]   MEDICAL RECORD NO.:  [id number]                   PATIENT TYPE:  OUT   LOCATION:  [facility]                                  FACILITY:  [facility]   PHYSICIAN:  Nicki Guadalajara, M.D.                  DATE OF BIRTH:  14-Sep-1955   DATE OF PROCEDURE:  04/16/2003  DATE OF DISCHARGE:                                  ECHOCARDIOGRAM   INDICATIONS FOR PROCEDURE:  This study is performed in this 55 year old  female with history of obesity, hypertension, and diabetes mellitus who has  had increasing problems with shortness of breath. She is referred for an  echocardiogram Doppler evaluation.   TECHNICAL QUALITY:  1. Technically, this is an adequate M-mode, 2-dimensional and comprehensive     Doppler echocardiogram.  2. There is evidence for left ventricular hypertrophy, somewhat     asymmetrical, with the septum measuring 22 mm and posterior wall     measuring 14 mm. Left ventricular end-diastolic and end-systolic     dimensions were normal at 42 and 24 mm, respectively. Systolic function     is normal. There were no focal segmental wall motion abnormalities. There     was evidence for mild delay in diastolic relaxation.  3. Left atrial dimension is normal at 38 mm. Right atrium and right     ventricle were normal.  4. Aortic root dimension is normal at 36 mm.  5. Aortic valve was tri-leaflet.  6. There was no aortic regurgitation. There was evidence for common annular     calcification.  7. Mitral valve leaflets were delicate. There was no evidence for any     systolic anterior motion. There was no mitral valve prolapse.  8. Tricuspid valve was structurally normal.  9. The pulmonic valve was not well visualized. There is appear to be     trivial/physiologic PR.  10.      There were no intramyocardial masses, thrombi, or fusions.  11.      Doppler study did not demonstrate any evidence for an left     ventricular outflow tract  gradient.  12.      In addition, the M-mode signal of the mitral valve did not     demonstrate any pre-closure, auguring against hypertrophy obstructive     cardiomyopathy.    IMPRESSION:  Technically, this is an adequate Doppler study, demonstrating  normal left ventricular systolic function with moderate left ventricular  hypertrophy, asymmetric, without evidence for left ventricular outflow tract  obstruction. There is evidence for mild delay in diastolic relaxation. In  addition, there is evidence for mild common annular calcification.  Nicki Guadalajara, M.D.    TK/MEDQ  D:  04/17/2003  T:  04/17/2003  Job:  [id number]   cc:   Dani Gobble, MD  [phone] [address]  [place]  [place] [address]  Fax: [phone]

## 2011-02-16 DIAGNOSIS — G4730 Sleep apnea, unspecified: Secondary | ICD-10-CM

## 2011-02-17 ENCOUNTER — Encounter: Payer: Self-pay | Admitting: Endocrinology

## 2011-02-17 ENCOUNTER — Other Ambulatory Visit (INDEPENDENT_AMBULATORY_CARE_PROVIDER_SITE_OTHER): Payer: BC Managed Care – PPO

## 2011-02-17 ENCOUNTER — Ambulatory Visit (INDEPENDENT_AMBULATORY_CARE_PROVIDER_SITE_OTHER): Payer: BC Managed Care – PPO | Admitting: Endocrinology

## 2011-02-17 DIAGNOSIS — Z79899 Other long term (current) drug therapy: Secondary | ICD-10-CM | POA: Insufficient documentation

## 2011-02-17 DIAGNOSIS — E785 Hyperlipidemia, unspecified: Secondary | ICD-10-CM

## 2011-02-17 DIAGNOSIS — E109 Type 1 diabetes mellitus without complications: Secondary | ICD-10-CM

## 2011-02-17 DIAGNOSIS — I10 Essential (primary) hypertension: Secondary | ICD-10-CM

## 2011-02-17 DIAGNOSIS — E1149 Type 2 diabetes mellitus with other diabetic neurological complication: Secondary | ICD-10-CM

## 2011-02-17 DIAGNOSIS — Z0000 Encounter for general adult medical examination without abnormal findings: Secondary | ICD-10-CM | POA: Insufficient documentation

## 2011-02-17 LAB — HEMOGLOBIN A1C: Hgb A1c MFr Bld: 7.4 % — ABNORMAL HIGH (ref 4.6–6.5)

## 2011-02-17 LAB — URINALYSIS, ROUTINE W REFLEX MICROSCOPIC
Bilirubin Urine: NEGATIVE
Hgb urine dipstick: NEGATIVE
Ketones, ur: NEGATIVE
Leukocytes, UA: NEGATIVE
Nitrite: NEGATIVE
Specific Gravity, Urine: >=1.03 (ref 1.000–1.030)
Total Protein, Urine: NEGATIVE
Urine Glucose: NEGATIVE
Urobilinogen, UA: 0.2 (ref 0.0–1.0)
pH: 5.5 (ref 5.0–8.0)

## 2011-02-17 LAB — MICROALBUMIN / CREATININE URINE RATIO
Creatinine,U: 121.6 mg/dL
Microalb Creat Ratio: 0.8 mg/g (ref 0.0–30.0)
Microalb, Ur: 1 mg/dL (ref 0.0–1.9)

## 2011-02-17 LAB — BASIC METABOLIC PANEL
BUN: 26 mg/dL — ABNORMAL HIGH (ref 6–23)
CO2: 30 mEq/L (ref 19–32)
Calcium: 11 mg/dL — ABNORMAL HIGH (ref 8.4–10.5)
Chloride: 106 mEq/L (ref 96–112)
Creatinine, Ser: 1.1 mg/dL (ref 0.4–1.2)
GFR: 54.97 mL/min — ABNORMAL LOW (ref >60.00)
Glucose, Bld: 68 mg/dL — ABNORMAL LOW (ref 70–99)
Potassium: 4 mEq/L (ref 3.5–5.1)
Sodium: 141 mEq/L (ref 135–145)

## 2011-02-17 LAB — CBC WITH DIFFERENTIAL/PLATELET
Basophils Absolute: 0 10*3/uL (ref 0.0–0.1)
Basophils Relative: 0.3 % (ref 0.0–3.0)
Eosinophils Absolute: 0.8 10*3/uL — ABNORMAL HIGH (ref 0.0–0.7)
Eosinophils Relative: 5.3 % — ABNORMAL HIGH (ref 0.0–5.0)
HCT: 38.9 % (ref 36.0–46.0)
Hemoglobin: 12.8 g/dL (ref 12.0–15.0)
Lymphocytes Relative: 15.8 % (ref 12.0–46.0)
Lymphs Abs: 2.4 10*3/uL (ref 0.7–4.0)
MCHC: 33 g/dL (ref 30.0–36.0)
MCV: 83.3 fl (ref 78.0–100.0)
Monocytes Absolute: 0.9 10*3/uL (ref 0.1–1.0)
Monocytes Relative: 5.7 % (ref 3.0–12.0)
Neutro Abs: 11 10*3/uL — ABNORMAL HIGH (ref 1.4–7.7)
Neutrophils Relative %: 72.9 % (ref 43.0–77.0)
Platelets: 286 10*3/uL (ref 150.0–400.0)
RBC: 4.67 Mil/uL (ref 3.87–5.11)
RDW: 14.7 % — ABNORMAL HIGH (ref 11.5–14.6)
WBC: 15.1 10*3/uL — ABNORMAL HIGH (ref 4.5–10.5)

## 2011-02-17 LAB — HEPATIC FUNCTION PANEL
ALT: 15 U/L (ref 0–35)
AST: 18 U/L (ref 0–37)
Albumin: 3.8 g/dL (ref 3.5–5.2)
Alkaline Phosphatase: 106 U/L (ref 39–117)
Bilirubin, Direct: 0.1 mg/dL (ref 0.0–0.3)
Total Bilirubin: 0.5 mg/dL (ref 0.3–1.2)
Total Protein: 7.1 g/dL (ref 6.0–8.3)

## 2011-02-17 LAB — TSH: TSH: 3.35 u[IU]/mL (ref 0.35–5.50)

## 2011-02-17 LAB — LIPID PANEL
Cholesterol: 133 mg/dL (ref 0–200)
HDL: 40.5 mg/dL (ref >39.00)
LDL Cholesterol: 81 mg/dL (ref 0–99)
Total CHOL/HDL Ratio: 3
Triglycerides: 56 mg/dL (ref 0.0–149.0)
VLDL: 11.2 mg/dL (ref 0.0–40.0)

## 2011-02-17 LAB — VITAMIN B12: Vitamin B-12: 299 pg/mL (ref 211–911)

## 2011-02-17 MED ORDER — LISINOPRIL 40 MG PO TABS: 40.0000 mg | ORAL_TABLET | Freq: Every day | ORAL | Status: DC

## 2011-02-17 MED ORDER — ATORVASTATIN CALCIUM 10 MG PO TABS: 10.0000 mg | ORAL_TABLET | Freq: Every day | ORAL | Status: DC

## 2011-02-17 MED ORDER — ATORVASTATIN CALCIUM 40 MG PO TABS: 40.0000 mg | ORAL_TABLET | Freq: Every day | ORAL | Status: DC

## 2011-02-17 NOTE — Progress Notes (Signed)
Subjective:    Patient ID: Stacey Wise, female    DOB: [birth year], 55 y.o.   MRN: [id number]  HPI The state of at least three ongoing medical problems is addressed today: no cbg record, but states cbg's are well-controlled.  pt states she feels well in general. Htn:  She takes meds as rx'ed.  Denies sob. Dyslipidemia: she takes lipitor as rx'ed.  Denies chest pain. Past Medical History  Diagnosis Date  . DIABETES MELLITUS, TYPE I 02/28/2007  . DIABETIC PERIPHERAL NEUROPATHY 02/28/2007  . DYSLIPIDEMIA 02/28/2007  . HYPERTENSION 02/28/2007  . SLEEP APNEA 02/28/2007  . Morbid obesity 02/28/2007    Past Surgical History  Procedure Date  . Electrocardiogram 11/19/2004    History   Social History  . Marital Status: Single    Spouse Name: N/A    Number of Children: N/A  . Years of Education: N/A   Occupational History  . 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 Smoking status: Never Smoker   . Smokeless tobacco: Not on file  . Alcohol Use: Not on file  . Drug Use: Not on file  . Sexually Active: Not on file   Other Topics Concern  . Not on file   Social History Narrative  . No narrative on file    Current Outpatient Prescriptions on File Prior to Visit  Medication Sig Dispense Refill  . aspirin buffered (BUFFERIN) 325 MG TABS tablet Take 325 mg by mouth daily.        [name] atorvastatin (LIPITOR) 40 MG tablet Take 40 mg by mouth daily.        [name] diltiazem (CARDIZEM CD) 300 MG 24 hr capsule Take 300 mg by mouth daily.        . insulin NPH-insulin regular (RELION 70/30) (70-30) 100 UNIT/ML injection Inject into the skin. 110 units am and 50 units pm       . lisinopril-hydrochlorothiazide (PRINZIDE,ZESTORETIC) 20-12.5 MG per tablet TAKE TWO TABLETS BY MOUTH EVERY DAY  180 tablet  1    No Known Allergies  Family History  Problem Relation Age of Onset  . Diabetes Mother   . Diabetes Other     Sibling  . Diabetes Other     Sibling    BP  136/80  Pulse 81  Temp(Src) 97.9 F (36.6 C) (Oral)  Ht 5\' 4"  (1.626 m)  Wt 344 lb (156.037 kg)  BMI 59.05 kg/m2  SpO2 96%   Review of Systems denies hypoglycemia and weight change    Objective:   Physical Exam Pulses: dorsalis pedis intact bilat.   Feet: no deformity.  no ulcer on the feet.  feet are of normal color and temp.  no edema Neuro: sensation is intact to touch on the feet     Lab Results  Component Value Date   HGBA1C 7.4* 02/17/2011   Lab Results  Component Value Date   CHOL 133 02/17/2011   CHOL 127 05/28/2009   Lab Results  Component Value Date   HDL 40.50 02/17/2011   HDL 21.30* 05/28/2009   Lab Results  Component Value Date   LDLCALC 81 02/17/2011   LDLCALC 76 05/28/2009   Lab Results  Component Value Date   TRIG 56.0 02/17/2011   TRIG 80.0 05/28/2009   Lab Results  Component Value Date   CHOLHDL 3 02/17/2011   CHOLHDL 4 05/28/2009   No results found for this basename: LDLDIRECT      Assessment & Plan:  Dm, needs increased rx Dyslipidemia, well-controlled Htn, well-controlled, but cardizem interacts with lipitor Hypercalcemia, uncertain etiology, but could be exac by hctz.

## 2011-02-17 NOTE — Patient Instructions (Addendum)
blood tests are being ordered for you today.  please call [phone] to hear your test results.  You will be prompted to enter the 9-digit "MRN" number that appears at the top left of this page, followed by #.  Then you will hear the message. pending the test results, please continue the same medications for now good diet and exercise habits significanly improve the control of your diabetes.  please let me know if you wish to be referred to a dietician.  high blood sugar is very risky to your health.  you should see an eye doctor every year. controlling your blood pressure and cholesterol drastically reduces the damage diabetes does to your body.  this also applies to quitting smoking.  please discuss these with your doctor.  you should take an aspirin every day, unless you have been advised by a doctor not to. check your blood sugar 2 times a day.  vary the time of day when you check, between before the 3 meals, and at bedtime.  also check if you have symptoms of your blood sugar being too high or too low.  please keep a record of the readings and bring it to your next appointment here.  please call us sooner if you are having low blood sugar episodes. Please make a regular physical appointment in 3 months (update: i left message on phone-tree:  Change zestoretic to zestril 40/d.  Reduce lipitor to 10/d.  Ret. 30d

## 2011-02-18 ENCOUNTER — Other Ambulatory Visit: Payer: Self-pay | Admitting: *Deleted

## 2011-02-18 MED ORDER — ATORVASTATIN CALCIUM 10 MG PO TABS: 10.0000 mg | ORAL_TABLET | Freq: Every day | ORAL | Status: DC

## 2011-02-18 NOTE — Telephone Encounter (Signed)
R'cd fax from [facility] is requesting 90 day supply of Lipitor for insurance reasons.

## 2011-03-10 ENCOUNTER — Other Ambulatory Visit: Payer: Self-pay | Admitting: Endocrinology

## 2011-03-21 ENCOUNTER — Ambulatory Visit: Payer: BC Managed Care – PPO | Admitting: Endocrinology

## 2011-03-22 ENCOUNTER — Encounter: Payer: Self-pay | Admitting: Endocrinology

## 2011-03-22 ENCOUNTER — Other Ambulatory Visit (INDEPENDENT_AMBULATORY_CARE_PROVIDER_SITE_OTHER): Payer: BC Managed Care – PPO

## 2011-03-22 ENCOUNTER — Ambulatory Visit (INDEPENDENT_AMBULATORY_CARE_PROVIDER_SITE_OTHER): Payer: BC Managed Care – PPO | Admitting: Endocrinology

## 2011-03-22 LAB — BASIC METABOLIC PANEL
BUN: 22 mg/dL (ref 6–23)
CO2: 29 mEq/L (ref 19–32)
Calcium: 10.7 mg/dL — ABNORMAL HIGH (ref 8.4–10.5)
Chloride: 111 mEq/L (ref 96–112)
Creatinine, Ser: 1 mg/dL (ref 0.4–1.2)
GFR: 65.08 mL/min (ref >60.00)
Glucose, Bld: 154 mg/dL — ABNORMAL HIGH (ref 70–99)
Potassium: 3.9 mEq/L (ref 3.5–5.1)
Sodium: 144 mEq/L (ref 135–145)

## 2011-03-22 NOTE — Patient Instructions (Addendum)
Please make a regular physical appointment in 2 months blood tests are being ordered for you today.  please call [phone] to hear your test results.  You will be prompted to enter the 9-digit "MRN" number that appears at the top left of this page, followed by #.  Then you will hear the message. pending the test results, please continue the same medications for now.

## 2011-03-22 NOTE — Progress Notes (Signed)
  Subjective:    Patient ID: Stacey Wise, female    DOB: [birth year], 55 y.o.   MRN: [id number]  HPI Hypercalcemia was noted at last ov.  She has few mos of mild urinary frequency, in the context of excessive thirst.  No assoc n/v.  She denies h/o urolithasis/fracture/osteoporosis. no cbg record, but states cbg's are well-controlled She takes bp meds as rx'ed.   Past Medical History  Diagnosis Date  . DIABETES MELLITUS, TYPE I 02/28/2007  . DIABETIC PERIPHERAL NEUROPATHY 02/28/2007  . DYSLIPIDEMIA 02/28/2007  . HYPERTENSION 02/28/2007  . SLEEP APNEA 02/28/2007  . Morbid obesity 02/28/2007    Past Surgical History  Procedure Date  . Electrocardiogram 11/19/2004    History   Social History  . Marital Status: Single    Spouse Name: N/A    Number of Children: N/A  . Years of Education: N/A   Occupational History  . 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 Smoking status: Never Smoker   . Smokeless tobacco: Not on file  . Alcohol Use: Not on file  . Drug Use: Not on file  . Sexually Active: Not on file   Other Topics Concern  . Not on file   Social History Narrative  . No narrative on file    Current Outpatient Prescriptions on File Prior to Visit  Medication Sig Dispense Refill  . aspirin buffered (BUFFERIN) 325 MG TABS tablet Take 325 mg by mouth daily.        [name] atorvastatin (LIPITOR) 10 MG tablet Take 1 tablet (10 mg total) by mouth daily.  90 tablet  3  . diltiazem (CARDIZEM CD) 300 MG 24 hr capsule Take 300 mg by mouth daily.        [name] diltiazem (TIAZAC) 300 MG 24 hr capsule TAKE ONE CAPSULE BY MOUTH ONE TIME DAILY  30 capsule  3  . insulin NPH-insulin regular (RELION 70/30) (70-30) 100 UNIT/ML injection Inject into the skin. 110 units am and 50 units pm       . lisinopril (PRINIVIL,ZESTRIL) 40 MG tablet Take 1 tablet (40 mg total) by mouth daily.  30 tablet  11    No Known Allergies  Family History  Problem Relation Age of Onset  .  Diabetes Mother   . Diabetes Other     Sibling  . Diabetes Other     Sibling   BP 128/82  Pulse 82  Temp(Src) 98.2 F (36.8 C) (Oral)  Ht 5' 4.5" (1.638 m)  Wt 352 lb (159.666 kg)  BMI 59.49 kg/m2  SpO2 95%  Review of Systems Denies hypoglycemia and constipation    Objective:   Physical Exam GENERAL: no distress.  Obese Ext: trace bilat leg edema    Lab Results  Component Value Date   HGBA1C 7.4* 02/17/2011   Assessment & Plan:  Dm.  this is the best control this pt should aim for, given this regimen, which does match insulin to her changing needs throughout the day. htn , well-controlled, off the hctz Hypercalcemia, caused or exacerbated by the hctz

## 2011-03-23 LAB — PTH, INTACT AND CALCIUM
Calcium, Total (PTH): 11.2 mg/dL — ABNORMAL HIGH (ref 8.4–10.5)
PTH: 111.7 pg/mL — ABNORMAL HIGH (ref 14.0–72.0)

## 2011-03-30 ENCOUNTER — Other Ambulatory Visit: Payer: Self-pay | Admitting: Endocrinology

## 2011-05-19 ENCOUNTER — Ambulatory Visit (INDEPENDENT_AMBULATORY_CARE_PROVIDER_SITE_OTHER): Payer: BC Managed Care – PPO | Admitting: Endocrinology

## 2011-05-19 ENCOUNTER — Other Ambulatory Visit (INDEPENDENT_AMBULATORY_CARE_PROVIDER_SITE_OTHER): Payer: BC Managed Care – PPO

## 2011-05-19 ENCOUNTER — Encounter: Payer: Self-pay | Admitting: Endocrinology

## 2011-05-19 DIAGNOSIS — I10 Essential (primary) hypertension: Secondary | ICD-10-CM

## 2011-05-19 DIAGNOSIS — Z79899 Other long term (current) drug therapy: Secondary | ICD-10-CM

## 2011-05-19 DIAGNOSIS — E109 Type 1 diabetes mellitus without complications: Secondary | ICD-10-CM

## 2011-05-19 DIAGNOSIS — Z23 Encounter for immunization: Secondary | ICD-10-CM

## 2011-05-19 DIAGNOSIS — Z0000 Encounter for general adult medical examination without abnormal findings: Secondary | ICD-10-CM

## 2011-05-19 LAB — HEMOGLOBIN A1C: Hgb A1c MFr Bld: 7.3 % — ABNORMAL HIGH (ref 4.6–6.5)

## 2011-05-19 NOTE — Progress Notes (Signed)
Subjective:    Patient ID: Stacey Wise, female    DOB: 1955-11-16, 55 y.o.   MRN: [id number]  HPI here for regular wellness examination.  she's feeling pretty well in general, and says chronic med probs are stable, except as noted below Past Medical History  Diagnosis Date  . DIABETES MELLITUS, TYPE I 02/28/2007  . DIABETIC PERIPHERAL NEUROPATHY 02/28/2007  . DYSLIPIDEMIA 02/28/2007  . HYPERTENSION 02/28/2007  . SLEEP APNEA 02/28/2007  . Morbid obesity 02/28/2007    Past Surgical History  Procedure Date  . Electrocardiogram 11/19/2004    History   Social History  . Marital Status: Single    Spouse Name: N/A    Number of Children: N/A  . Years of Education: N/A   Occupational History  . 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 Smoking status: Never Smoker   . Smokeless tobacco: Not on file  . Alcohol Use: Not on file  . Drug Use: Not on file  . Sexually Active: Not on file   Other Topics Concern  . Not on file   Social History Narrative  . No narrative on file    Current Outpatient Prescriptions on File Prior to Visit  Medication Sig Dispense Refill  . aspirin buffered (BUFFERIN) 325 MG TABS tablet Take 325 mg by mouth daily.        [name] atorvastatin (LIPITOR) 10 MG tablet Take 1 tablet (10 mg total) by mouth daily.  90 tablet  3  . diltiazem (TIAZAC) 300 MG 24 hr capsule TAKE ONE CAPSULE BY MOUTH ONE TIME DAILY  30 capsule  3  . HUMULIN 70/30 (70-30) 100 UNIT/ML injection INJECT 110 UNITS SUBCUTANEOUSLY IN THE MORNING AND 50 UNITS IN THE EVENING  50 mL  5  . lisinopril (PRINIVIL,ZESTRIL) 40 MG tablet Take 1 tablet (40 mg total) by mouth daily.  30 tablet  11  . norethindrone (ERRIN) 0.35 MG tablet Take 1 tablet by mouth daily.          No Known Allergies  Family History  Problem Relation Age of Onset  . Diabetes Mother   . Diabetes Other     Sibling  . Diabetes Other     Sibling   BP 142/74  Pulse 84  Temp(Src) 98.1 F (36.7  C) (Oral)  Ht 5\' 4"  (1.626 m)  Wt 346 lb 12.8 oz (157.307 kg)  BMI 59.53 kg/m2  SpO2 96%  Review of Systems  Constitutional: Negative for fever.  HENT: Negative for hearing loss.   Eyes: Negative for visual disturbance.  Respiratory: Negative for shortness of breath.   Cardiovascular: Negative for chest pain.  Gastrointestinal: Negative for anal bleeding.  Genitourinary: Negative for hematuria.  Musculoskeletal: Positive for back pain.  Skin: Negative for rash.  Neurological: Negative for syncope and headaches.  Hematological: Does not bruise/bleed easily.  Psychiatric/Behavioral: Negative for dysphoric mood.   denies dyspeptic sxs.      Objective:   Physical Exam VS: see vs page GEN: no distress HEAD: head: no deformity eyes: no periorbital swelling, no proptosis external nose and ears are normal mouth: no lesion seen NECK: supple, thyroid is not enlarged CHEST WALL: no deformity LUNGS:  Clear to auscultation CV: reg rate and rhythm, no murmur ABD: abdomen is soft, nontender.  no hepatosplenomegaly.  not distended.  no hernia MUSCULOSKELETAL: muscle bulk and strength are grossly normal.  no obvious joint swelling.  gait is normal and steady EXTEMITIES: no deformity.  no  ulcer on the feet.  feet are of normal color and temp.  no edema PULSES: dorsalis pedis intact bilat.  no carotid bruit NEURO:  cn 2-12 grossly intact.   readily moves all 4's.  sensation is intact to touch on the feet SKIN:  Normal texture and temperature.  No rash or suspicious lesion is visible.   NODES:  None palpable at the neck PSYCH: alert, oriented x3.  Does not appear anxious nor depressed.          Assessment & Plan:  Wellness visit today, with problems stable, except as noted.    SEPARATE EVALUATION FOLLOWS--EACH PROBLEM HERE IS NEW, NOT RESPONDING TO TREATMENT, OR POSES SIGNIFICANT RISK TO THE PATIENT'S HEALTH: HISTORY OF THE PRESENT ILLNESS: no cbg record, but states cbg's are  well-controlled.  She has chronic weight gain  She has a sister with hypercalcemia, uncertain etiology PAST MEDICAL HISTORY reviewed and up to date today REVIEW OF SYSTEMS: denies hypoglycemia PHYSICAL EXAMINATION:  VITAL SIGNS:  See vs page GENERAL: no distress Pulses: dorsalis pedis intact bilat.   Feet: no deformity.  no ulcer on the feet.  feet are of normal color and temp.  no edema.  Neuro: sensation is intact to touch on the feet.  There are bilat heavy calluses. There is bilteral onychomycosis.   LAB/XRAY RESULTS: Lab Results  Component Value Date   HGBA1C 7.3* 05/19/2011   IMPRESSION: Dm, this is the best control this pt should aim for, given this regimen, which does match insulin to her changing needs throughout the day Hypercalcemia, due to primary hyperparathyroidism Htn, with ? Of situational component PLAN: See instruction page

## 2011-05-19 NOTE — Patient Instructions (Addendum)
Please make a follow-up appointment in 4 months blood tests are being ordered for you today.  please call [phone] to hear your test results.  You will be prompted to enter the 9-digit "MRN" number that appears at the top left of this page, followed by #.  Then you will hear the message.  pending the test results, please continue the same medications for now.  please consider these measures for your health:  minimize alcohol.  do not use tobacco products.  have a colonoscopy at least every 10 years from age 42.  Women should have an annual mammogram from age 59.  keep firearms safely stored.  always use seat belts.  have working smoke alarms in your home.  see an eye doctor and dentist regularly.  never drive under the influence of alcohol or drugs (including prescription drugs).   good diet and exercise habits significanly improve the control of your diabetes.  please let me know if you wish to be referred to a dietician, or for weight-loss surgery.  high blood sugar is very risky to your health.  you should see an eye doctor every year. controlling your blood pressure and cholesterol drastically reduces the damage diabetes does to your body.  this also applies to quitting smoking.  please discuss these with your doctor.  you should take an aspirin every day, unless you have been advised by a doctor not to.   Refer to a surgery specialist.  you will receive a phone call, about a day and time for an appointment Refer for colonoscopy.  you will receive a phone call, about a day and time for an appointment

## 2011-05-24 LAB — PTH, INTACT AND CALCIUM
Calcium, Total (PTH): 10.5 mg/dL (ref 8.4–10.5)
PTH: 123.3 pg/mL — ABNORMAL HIGH (ref 14.0–72.0)

## 2011-05-31 LAB — GLUCOSE, CAPILLARY
Glucose-Capillary: 118 — ABNORMAL HIGH
Glucose-Capillary: 156 — ABNORMAL HIGH

## 2011-05-31 LAB — ABO/RH: ABO/RH(D): B POS

## 2011-05-31 LAB — COMPREHENSIVE METABOLIC PANEL
ALT: 28
AST: 41 — ABNORMAL HIGH
Albumin: 3.8
Alkaline Phosphatase: 85
BUN: 16
CO2: 24
Calcium: 10.2
Chloride: 103
Creatinine, Ser: 1.13
GFR calc Af Amer: >60
GFR calc non Af Amer: 51 — ABNORMAL LOW
Glucose, Bld: 160 — ABNORMAL HIGH
Potassium: 3.7
Sodium: 138
Total Bilirubin: 1
Total Protein: 7.8

## 2011-05-31 LAB — CBC
HCT: 44.1
Hemoglobin: 14.2
MCHC: 32.2
MCV: 83.4
Platelets: 290
RBC: 5.29 — ABNORMAL HIGH
RDW: 13.8
WBC: 14.6 — ABNORMAL HIGH

## 2011-05-31 LAB — TYPE AND SCREEN
ABO/RH(D): B POS
Antibody Screen: NEGATIVE

## 2011-06-23 ENCOUNTER — Encounter: Payer: Self-pay | Admitting: Gastroenterology

## 2011-07-11 ENCOUNTER — Other Ambulatory Visit: Payer: Self-pay | Admitting: Endocrinology

## 2011-09-13 ENCOUNTER — Encounter: Payer: Self-pay | Admitting: Endocrinology

## 2011-09-13 ENCOUNTER — Other Ambulatory Visit (INDEPENDENT_AMBULATORY_CARE_PROVIDER_SITE_OTHER): Payer: BC Managed Care – PPO

## 2011-09-13 ENCOUNTER — Ambulatory Visit (INDEPENDENT_AMBULATORY_CARE_PROVIDER_SITE_OTHER): Payer: BC Managed Care – PPO | Admitting: Endocrinology

## 2011-09-13 VITALS — BP 128/70 | HR 91 | Temp 97.4°F | Ht 64.0 in | Wt 343.5 lb

## 2011-09-13 DIAGNOSIS — E109 Type 1 diabetes mellitus without complications: Secondary | ICD-10-CM

## 2011-09-13 LAB — HEMOGLOBIN A1C: Hgb A1c MFr Bld: 6.7 % — ABNORMAL HIGH (ref 4.6–6.5)

## 2011-09-13 MED ORDER — GLUCOSE BLOOD VI STRP: 1.0000 | ORAL_STRIP | Freq: Two times a day (BID) | Status: AC

## 2011-09-13 NOTE — Progress Notes (Signed)
  Subjective:    Patient ID: Stacey Wise, female    DOB: Aug 12, 1956, 56 y.o.   MRN: [id number]  HPI Pt returns for f/u of insulin-requiring DM (1996).  She has had 2 episodes of moderate diaphoresis of the upper body, and assoc clammy feeling.  She checked cbg--it was in the 80's.  Both episodes were at approx 3 AM.  no cbg record, but states cbg's are highest in am, then it is lower as the day goes on.   Past Medical History  Diagnosis Date  . DIABETES MELLITUS, TYPE I 02/28/2007  . DIABETIC PERIPHERAL NEUROPATHY 02/28/2007  . DYSLIPIDEMIA 02/28/2007  . HYPERTENSION 02/28/2007  . SLEEP APNEA 02/28/2007  . Morbid obesity 02/28/2007    Past Surgical History  Procedure Date  . Electrocardiogram 11/19/2004    History   Social History  . Marital Status: Single    Spouse Name: N/A    Number of Children: N/A  . Years of Education: N/A   Occupational History  . 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 Smoking status: Never Smoker   . Smokeless tobacco: Not on file  . Alcohol Use: Not on file  . Drug Use: Not on file  . Sexually Active: Not on file   Other Topics Concern  . Not on file   Social History Narrative  . No narrative on file    Current Outpatient Prescriptions on File Prior to Visit  Medication Sig Dispense Refill  . aspirin buffered (BUFFERIN) 325 MG TABS tablet Take 325 mg by mouth daily.        [name] atorvastatin (LIPITOR) 10 MG tablet Take 1 tablet (10 mg total) by mouth daily.  90 tablet  3  . diltiazem (TIAZAC) 300 MG 24 hr capsule TAKE ONE CAPSULE EVERY DAY  30 capsule  8  . lisinopril (PRINIVIL,ZESTRIL) 40 MG tablet Take 1 tablet (40 mg total) by mouth daily.  30 tablet  11  . norethindrone (ERRIN) 0.35 MG tablet Take 1 tablet by mouth daily.          No Known Allergies  Family History  Problem Relation Age of Onset  . Diabetes Mother   . Diabetes Other     Sibling  . Diabetes Other     Sibling    BP 128/70  Pulse 91   Temp(Src) 97.4 F (36.3 C) (Oral)  Ht 5\' 4"  (1.626 m)  Wt 343 lb 8 oz (155.811 kg)  BMI 58.96 kg/m2  SpO2 97%   Review of Systems Denies loc and chest pain.     Objective:   Physical Exam VITAL SIGNS:  See vs page GENERAL: no distress LUNGS:  Clear to auscultation HEART:  Regular rate and rhythm without murmurs noted. Normal S1,S2.   Pulses: dorsalis pedis intact bilat.   Feet: no deformity.  no ulcer on the feet.  feet are of normal color and temp.  no edema Neuro: sensation is intact to touch on the feet.    Lab Results  Component Value Date   HGBA1C 6.7* 09/13/2011      Assessment & Plan:  DM is overcontrolled, given this regimen, which does match insulin to her changing needs throughout the day

## 2011-09-13 NOTE — Patient Instructions (Addendum)
Please make a follow-up appointment in 4 months blood tests are being ordered for you today.  please call [phone] to hear your test results.  You will be prompted to enter the 9-digit "MRN" number that appears at the top left of this page, followed by #.  Then you will hear the message.  pending the test results, please reduce insulin to 110 units with breakfast, and 40 units with the evening meal.  check your blood sugar 2 times a day.  vary the time of day when you check, between before the 3 meals, and at bedtime.  also check if you have symptoms of your blood sugar being too high or too low.  please keep a record of the readings and bring it to your next appointment here.  please call us sooner if your blood sugar goes below 70, or if it stays over 200. Here are 2 identical blood-sugar meters.  i have sent a prescription to your pharmacy, for strips.   (update: i left message on phone-tree:  rx as we discussed)

## 2011-11-11 ENCOUNTER — Other Ambulatory Visit: Payer: Self-pay | Admitting: Endocrinology

## 2012-01-11 ENCOUNTER — Ambulatory Visit (INDEPENDENT_AMBULATORY_CARE_PROVIDER_SITE_OTHER): Payer: BC Managed Care – PPO | Admitting: Endocrinology

## 2012-01-11 ENCOUNTER — Encounter: Payer: Self-pay | Admitting: Endocrinology

## 2012-01-11 ENCOUNTER — Other Ambulatory Visit (INDEPENDENT_AMBULATORY_CARE_PROVIDER_SITE_OTHER): Payer: BC Managed Care – PPO

## 2012-01-11 VITALS — BP 138/70 | HR 92 | Temp 97.6°F | Ht 64.0 in | Wt 352.0 lb

## 2012-01-11 DIAGNOSIS — R319 Hematuria, unspecified: Secondary | ICD-10-CM

## 2012-01-11 DIAGNOSIS — E109 Type 1 diabetes mellitus without complications: Secondary | ICD-10-CM

## 2012-01-11 LAB — URINALYSIS, ROUTINE W REFLEX MICROSCOPIC
Bilirubin Urine: NEGATIVE
Ketones, ur: NEGATIVE
Nitrite: NEGATIVE
Specific Gravity, Urine: <=1.005 (ref 1.000–1.030)
Total Protein, Urine: NEGATIVE
Urine Glucose: NEGATIVE
Urobilinogen, UA: 0.2 (ref 0.0–1.0)
pH: 6.5 (ref 5.0–8.0)

## 2012-01-11 LAB — POCT URINALYSIS DIPSTICK
Bilirubin, UA: NEGATIVE
Blood, UA: POSITIVE
Glucose, UA: NEGATIVE
Ketones, UA: NEGATIVE
Leukocytes, UA: NEGATIVE
Nitrite, UA: NEGATIVE
Protein, UA: NEGATIVE
Spec Grav, UA: 1.01
Urobilinogen, UA: 0.2
pH, UA: 6.5

## 2012-01-11 LAB — HEMOGLOBIN A1C: Hgb A1c MFr Bld: 7.2 % — ABNORMAL HIGH (ref 4.6–6.5)

## 2012-01-11 NOTE — Progress Notes (Signed)
  Subjective:    Patient ID: Stacey Wise, female    DOB: [birth year], 56 y.o.   MRN: [id number]  HPI Pt returns for f/u of insulin-requiring DM (1996, complicated by peripheral sensory neuropathy).  no cbg record, but states cbg's vary from 60-162.  It is in general higher as the day goes on.   Pt states 10 days of slight pain at the lower back, and assoc hematuria.  She was seen in urgent care last week, and was rx'ed cipro.   Past Medical History  Diagnosis Date  . DIABETES MELLITUS, TYPE I 02/28/2007  . DIABETIC PERIPHERAL NEUROPATHY 02/28/2007  . DYSLIPIDEMIA 02/28/2007  . HYPERTENSION 02/28/2007  . SLEEP APNEA 02/28/2007  . Morbid obesity 02/28/2007    Past Surgical History  Procedure Date  . Electrocardiogram 11/19/2004    History   Social History  . Marital Status: Single    Spouse Name: N/A    Number of Children: N/A  . Years of Education: N/A   Occupational History  . 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 Smoking status: Never Smoker   . Smokeless tobacco: Not on file  . Alcohol Use: Not on file  . Drug Use: Not on file  . Sexually Active: Not on file   Other Topics Concern  . Not on file   Social History Narrative  . No narrative on file    Current Outpatient Prescriptions on File Prior to Visit  Medication Sig Dispense Refill  . aspirin buffered (BUFFERIN) 325 MG TABS tablet Take 325 mg by mouth daily.        [name] atorvastatin (LIPITOR) 10 MG tablet Take 1 tablet (10 mg total) by mouth daily.  90 tablet  3  . diltiazem (TIAZAC) 300 MG 24 hr capsule TAKE ONE CAPSULE EVERY DAY  30 capsule  8  . glucose blood (ONE TOUCH ULTRA TEST) test strip 1 each by Other route 2 (two) times daily. 2x a day, and lancets 250.03  100 each  12  . insulin NPH-insulin regular (NOVOLIN 70/30) (70-30) 100 UNIT/ML injection Inject subcutaneously 110 units with breakfast and 30 units with the evening meal      . lisinopril (PRINIVIL,ZESTRIL) 40 MG tablet  Take 1 tablet (40 mg total) by mouth daily.  30 tablet  11  . norethindrone (ERRIN) 0.35 MG tablet Take 1 tablet by mouth daily.          No Known Allergies  Family History  Problem Relation Age of Onset  . Diabetes Mother   . Diabetes Other     Sibling  . Diabetes Other     Sibling    BP 138/70  Pulse 92  Temp(Src) 97.6 F (36.4 C) (Oral)  Ht 5\' 4"  (1.626 m)  Wt 352 lb (159.666 kg)  BMI 60.42 kg/m2  SpO2 97%  Review of Systems Denies fever and sob.      Objective:   Physical Exam VITAL SIGNS:  See vs page GENERAL: no distress Abd: no suprapubic tenderness.    Lab Results  Component Value Date   HGBA1C 7.2* 01/11/2012      Assessment & Plan:  Hematuria, new, uncertain etiology DM.  this is the best control this pt should aim for, given this regimen, which does match insulin to her changing needs throughout the day

## 2012-01-11 NOTE — Patient Instructions (Addendum)
pending the test results, please reduce insulin to 110 units with breakfast, and 30 units with the evening meal.  check your blood sugar 2 times a day.  vary the time of day when you check, between before the 3 meals, and at bedtime.  also check if you have symptoms of your blood sugar being too high or too low.  please keep a record of the readings and bring it to your next appointment here.  please call us sooner if your blood sugar goes below 70, or if it stays over 200.  blood tests are being requested for you today.  You will receive a letter with results.  Please redo the urine test in 1-2 weeks.  If the blood persists, i would refer you to a urologist.

## 2012-01-12 ENCOUNTER — Telehealth: Payer: Self-pay | Admitting: *Deleted

## 2012-01-12 NOTE — Telephone Encounter (Signed)
Called pt to inform of lab results, pt informed (letter also mailed to pt). 

## 2012-01-16 ENCOUNTER — Other Ambulatory Visit: Payer: Self-pay | Admitting: *Deleted

## 2012-01-16 MED ORDER — DILTIAZEM HCL ER BEADS 300 MG PO CP24: ORAL_CAPSULE | ORAL | Status: DC

## 2012-01-16 NOTE — Telephone Encounter (Signed)
[facility] for refill of Diltiazem for 90 day supply.

## 2012-02-14 ENCOUNTER — Other Ambulatory Visit: Payer: Self-pay | Admitting: *Deleted

## 2012-02-14 MED ORDER — LISINOPRIL 40 MG PO TABS: 40.0000 mg | ORAL_TABLET | Freq: Every day | ORAL | Status: DC

## 2012-02-14 NOTE — Telephone Encounter (Signed)
R'cd fax from [facility] pharmacy for refill of Lisinopril

## 2012-02-21 ENCOUNTER — Other Ambulatory Visit: Payer: Self-pay | Admitting: *Deleted

## 2012-02-21 MED ORDER — LISINOPRIL 40 MG PO TABS: 40.0000 mg | ORAL_TABLET | Freq: Every day | ORAL | Status: DC

## 2012-02-21 NOTE — Telephone Encounter (Signed)
R'cd fax from [facility] for refill of Lisinopril.

## 2012-04-05 ENCOUNTER — Other Ambulatory Visit: Payer: Self-pay | Admitting: Endocrinology

## 2012-04-12 ENCOUNTER — Other Ambulatory Visit: Payer: Self-pay | Admitting: Endocrinology

## 2012-05-10 ENCOUNTER — Other Ambulatory Visit (INDEPENDENT_AMBULATORY_CARE_PROVIDER_SITE_OTHER): Payer: BC Managed Care – PPO

## 2012-05-10 ENCOUNTER — Ambulatory Visit (INDEPENDENT_AMBULATORY_CARE_PROVIDER_SITE_OTHER): Payer: BC Managed Care – PPO | Admitting: Endocrinology

## 2012-05-10 ENCOUNTER — Encounter: Payer: Self-pay | Admitting: Endocrinology

## 2012-05-10 VITALS — BP 128/74 | HR 86 | Temp 98.4°F | Ht 64.0 in | Wt 347.0 lb

## 2012-05-10 DIAGNOSIS — E109 Type 1 diabetes mellitus without complications: Secondary | ICD-10-CM

## 2012-05-10 LAB — MICROALBUMIN / CREATININE URINE RATIO
Creatinine,U: 150.9 mg/dL
Microalb Creat Ratio: 0.3 mg/g (ref 0.0–30.0)
Microalb, Ur: 0.5 mg/dL (ref 0.0–1.9)

## 2012-05-10 LAB — HEMOGLOBIN A1C: Hgb A1c MFr Bld: 7.5 % — ABNORMAL HIGH (ref 4.6–6.5)

## 2012-05-10 NOTE — Progress Notes (Signed)
  Subjective:    Patient ID: Stacey Wise, female    DOB: Jun 02, 1956, 56 y.o.   MRN: [id number]  HPI Pt returns for f/u of insulin-requiring DM (1996, complicated by peripheral sensory neuropathy; she is on bid premixed insulin for cost an simplicity).  no cbg record, but states cbg's are well-controlled.  She seldom has hypoglycemia, and these are mild.   Past Medical History  Diagnosis Date  . DIABETES MELLITUS, TYPE I 02/28/2007  . DIABETIC PERIPHERAL NEUROPATHY 02/28/2007  . DYSLIPIDEMIA 02/28/2007  . HYPERTENSION 02/28/2007  . SLEEP APNEA 02/28/2007  . Morbid obesity 02/28/2007    Past Surgical History  Procedure Date  . Electrocardiogram 11/19/2004    History   Social History  . Marital Status: Single    Spouse Name: N/A    Number of Children: N/A  . Years of Education: N/A   Occupational History  . 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 Smoking status: Never Smoker   . Smokeless tobacco: Not on file  . Alcohol Use: Not on file  . Drug Use: Not on file  . Sexually Active: Not on file   Other Topics Concern  . Not on file   Social History Narrative  . No narrative on file    Current Outpatient Prescriptions on File Prior to Visit  Medication Sig Dispense Refill  . aspirin buffered (BUFFERIN) 325 MG TABS tablet Take 325 mg by mouth daily.        [name] diltiazem (TIAZAC) 300 MG 24 hr capsule TAKE ONE CAPSULE EVERY DAY  90 capsule  1  . glucose blood (ONE TOUCH ULTRA TEST) test strip 1 each by Other route 2 (two) times daily. 2x a day, and lancets 250.03  100 each  12  . insulin NPH-insulin regular (NOVOLIN 70/30 RELION) (70-30) 100 UNIT/ML injection Inject 110 units subcutaneously with breakfast and 30 units subcutaneously with the evening meal  50 mL  4  . lisinopril (PRINIVIL,ZESTRIL) 40 MG tablet Take 1 tablet (40 mg total) by mouth daily.  30 tablet  5  . norethindrone (ERRIN) 0.35 MG tablet Take 1 tablet by mouth daily.        [name]  atorvastatin (LIPITOR) 10 MG tablet Take 1 tablet (10 mg total) by mouth daily.  90 tablet  3    No Known Allergies  Family History  Problem Relation Age of Onset  . Diabetes Mother   . Diabetes Other     Sibling  . Diabetes Other     Sibling    BP 128/74  Pulse 86  Temp 98.4 F (36.9 C) (Oral)  Ht 5\' 4"  (1.626 m)  Wt 347 lb (157.398 kg)  BMI 59.56 kg/m2  SpO2 95%   Review of Systems Denies LOC    Objective:   Physical Exam VITAL SIGNS:  See vs page GENERAL: no distress Pulses: dorsalis pedis intact bilat.   Feet: no deformity.  no ulcer on the feet.  feet are of normal color and temp.  no edema Neuro: sensation is intact to touch on the feet.    Lab Results  Component Value Date   HGBA1C 7.5* 05/10/2012      Assessment & Plan:  DM: this is the best control this pt should aim for, given this regimen, which does match insulin to her changing needs throughout the day

## 2012-05-10 NOTE — Patient Instructions (Addendum)
pending the test results, please continue insulin: 110 units with breakfast, and 30 units with the evening meal.  check your blood sugar 2 times a day.  vary the time of day when you check, between before the 3 meals, and at bedtime.  also check if you have symptoms of your blood sugar being too high or too low.  please keep a record of the readings and bring it to your next appointment here.  please call us sooner if your blood sugar goes below 70, or if it stays over 200.  blood tests are being requested for you today.  You will receive a letter with results.  

## 2012-05-11 ENCOUNTER — Encounter: Payer: Self-pay | Admitting: Endocrinology

## 2012-08-03 ENCOUNTER — Other Ambulatory Visit: Payer: Self-pay | Admitting: Endocrinology

## 2012-09-06 ENCOUNTER — Ambulatory Visit (INDEPENDENT_AMBULATORY_CARE_PROVIDER_SITE_OTHER): Payer: BC Managed Care – PPO | Admitting: Endocrinology

## 2012-09-06 VITALS — BP 136/84 | HR 90 | Temp 97.8°F | Wt 344.0 lb

## 2012-09-06 DIAGNOSIS — E109 Type 1 diabetes mellitus without complications: Secondary | ICD-10-CM

## 2012-09-06 LAB — HEMOGLOBIN A1C
Hgb A1c MFr Bld: 7.6 % — ABNORMAL HIGH (ref <5.7)
Mean Plasma Glucose: 171 mg/dL — ABNORMAL HIGH (ref <117)

## 2012-09-06 NOTE — Progress Notes (Signed)
  Subjective:    Patient ID: Stacey Wise, female    DOB: 01/26/1956, 57 y.o.   MRN: [id number]  HPI Pt returns for f/u of insulin-requiring DM (1996, complicated by peripheral sensory neuropathy; she is on bid premixed insulin for cost and simplicity).  no cbg record, but states cbg's are well-controlled.  She seldom has hypoglycemia, and these are mild.   Past Medical History  Diagnosis Date  . DIABETES MELLITUS, TYPE I 02/28/2007  . DIABETIC PERIPHERAL NEUROPATHY 02/28/2007  . DYSLIPIDEMIA 02/28/2007  . HYPERTENSION 02/28/2007  . SLEEP APNEA 02/28/2007  . Morbid obesity 02/28/2007    Past Surgical History  Procedure Date  . Electrocardiogram 11/19/2004    History   Social History  . Marital Status: Single    Spouse Name: N/A    Number of Children: N/A  . Years of Education: N/A   Occupational History  . 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 Smoking status: Never Smoker   . Smokeless tobacco: Not on file  . Alcohol Use: Not on file  . Drug Use: Not on file  . Sexually Active: Not on file   Other Topics Concern  . Not on file   Social History Narrative  . No narrative on file    Current Outpatient Prescriptions on File Prior to Visit  Medication Sig Dispense Refill  . aspirin buffered (BUFFERIN) 325 MG TABS tablet Take 325 mg by mouth daily.        [name] atorvastatin (LIPITOR) 10 MG tablet Take 1 tablet (10 mg total) by mouth daily.  90 tablet  3  . diltiazem (TIAZAC) 300 MG 24 hr capsule TAKE ONE CAPSULE EVERY DAY  90 capsule  1  . glucose blood (ONE TOUCH ULTRA TEST) test strip 1 each by Other route 2 (two) times daily. 2x a day, and lancets 250.03  100 each  12  . insulin NPH-insulin regular (NOVOLIN 70/30 RELION) (70-30) 100 UNIT/ML injection Inject 110 units subcutaneously with breakfast and 30 units subcutaneously with the evening meal  50 mL  4  . lisinopril (PRINIVIL,ZESTRIL) 40 MG tablet Take 1 tablet (40 mg total) by mouth daily.   30 tablet  5  . lisinopril (PRINIVIL,ZESTRIL) 40 MG tablet TAKE 1 TABLET (40 MG TOTAL) BY MOUTH DAILY.  30 tablet  5  . norethindrone (ERRIN) 0.35 MG tablet Take 1 tablet by mouth daily.          No Known Allergies  Family History  Problem Relation Age of Onset  . Diabetes Mother   . Diabetes Other     Sibling  . Diabetes Other     Sibling   BP 136/84  Pulse 90  Temp 97.8 F (36.6 C) (Oral)  Wt 344 lb (156.037 kg)  SpO2 96%  Review of Systems Denies LOC    Objective:   Physical Exam VITAL SIGNS:  See vs page GENERAL: no distress.  Obese SKIN:  Insulin injection sites at the anterior abdomen are normal  Lab Results  Component Value Date   HGBA1C 7.6* 09/06/2012      Assessment & Plan:  DM: this is the best control this pt should aim for, given this regimen, which does match insulin to her changing needs throughout the day

## 2012-09-06 NOTE — Patient Instructions (Addendum)
pending the test results, please continue insulin: 110 units with breakfast, and 30 units with the evening meal.  check your blood sugar 2 times a day.  vary the time of day when you check, between before the 3 meals, and at bedtime.  also check if you have symptoms of your blood sugar being too high or too low.  please keep a record of the readings and bring it to your next appointment here.  please call us sooner if your blood sugar goes below 70, or if it stays over 200.  blood tests are being requested for you today.  You will receive a letter with results.

## 2012-09-20 ENCOUNTER — Other Ambulatory Visit: Payer: Self-pay

## 2012-09-20 MED ORDER — ATORVASTATIN CALCIUM 10 MG PO TABS: 10.0000 mg | ORAL_TABLET | Freq: Every day | ORAL | Status: DC

## 2012-09-21 ENCOUNTER — Other Ambulatory Visit: Payer: Self-pay | Admitting: *Deleted

## 2012-09-21 MED ORDER — ATORVASTATIN CALCIUM 10 MG PO TABS: 10.0000 mg | ORAL_TABLET | Freq: Every day | ORAL | Status: DC

## 2012-10-09 ENCOUNTER — Other Ambulatory Visit: Payer: Self-pay

## 2012-10-09 MED ORDER — DILTIAZEM HCL ER BEADS 300 MG PO CP24: ORAL_CAPSULE | ORAL | Status: DC

## 2012-10-18 ENCOUNTER — Other Ambulatory Visit: Payer: Self-pay

## 2012-10-18 MED ORDER — DILTIAZEM HCL ER BEADS 300 MG PO CP24: ORAL_CAPSULE | ORAL | Status: DC

## 2012-10-30 ENCOUNTER — Other Ambulatory Visit: Payer: Self-pay | Admitting: Endocrinology

## 2012-11-29 ENCOUNTER — Other Ambulatory Visit: Payer: Self-pay | Admitting: Obstetrics and Gynecology

## 2012-12-03 ENCOUNTER — Other Ambulatory Visit: Payer: Self-pay | Admitting: Obstetrics and Gynecology

## 2012-12-03 DIAGNOSIS — R928 Other abnormal and inconclusive findings on diagnostic imaging of breast: Secondary | ICD-10-CM

## 2012-12-05 ENCOUNTER — Ambulatory Visit (INDEPENDENT_AMBULATORY_CARE_PROVIDER_SITE_OTHER): Payer: BC Managed Care – PPO | Admitting: Endocrinology

## 2012-12-05 VITALS — BP 138/80 | HR 90 | Wt 340.0 lb

## 2012-12-05 DIAGNOSIS — E109 Type 1 diabetes mellitus without complications: Secondary | ICD-10-CM

## 2012-12-05 LAB — HEMOGLOBIN A1C: Hgb A1c MFr Bld: 7.6 % — ABNORMAL HIGH (ref 4.6–6.5)

## 2012-12-05 NOTE — Progress Notes (Signed)
  Subjective:    Patient ID: Stacey Wise, female    DOB: 03-19-1956, 57 y.o.   MRN: [id number]  HPI Pt returns for f/u of insulin-requiring DM (dx'ed 1996, complicated by peripheral sensory neuropathy; she is on bid premixed insulin for cost and simplicity; she has never had severe hypoglycemia or DKA).  no cbg record, but states cbg's are well-controlled.  She seldom has hypoglycemia, and these are mild.  This happens when she misses a meal.  It is in general higher as the day goes on.   Past Medical History  Diagnosis Date  . DIABETES MELLITUS, TYPE I 02/28/2007  . DIABETIC PERIPHERAL NEUROPATHY 02/28/2007  . DYSLIPIDEMIA 02/28/2007  . HYPERTENSION 02/28/2007  . SLEEP APNEA 02/28/2007  . Morbid obesity 02/28/2007    Past Surgical History  Procedure Laterality Date  . Electrocardiogram  11/19/2004    History   Social History  . Marital Status: Single    Spouse Name: N/A    Number of Children: N/A  . Years of Education: N/A   Occupational History  . 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 Smoking status: Never Smoker   . Smokeless tobacco: Not on file  . Alcohol Use: Not on file  . Drug Use: Not on file  . Sexually Active: Not on file   Other Topics Concern  . Not on file   Social History Narrative  . No narrative on file    Current Outpatient Prescriptions on File Prior to Visit  Medication Sig Dispense Refill  . aspirin buffered (BUFFERIN) 325 MG TABS tablet Take 325 mg by mouth daily.        [name] atorvastatin (LIPITOR) 10 MG tablet Take 1 tablet (10 mg total) by mouth daily.  90 tablet  3  . diltiazem (TIAZAC) 300 MG 24 hr capsule TAKE ONE CAPSULE EVERY DAY  90 capsule  1  . lisinopril (PRINIVIL,ZESTRIL) 40 MG tablet Take 1 tablet (40 mg total) by mouth daily.  30 tablet  5  . lisinopril (PRINIVIL,ZESTRIL) 40 MG tablet TAKE 1 TABLET (40 MG TOTAL) BY MOUTH DAILY.  30 tablet  5  . norethindrone (ERRIN) 0.35 MG tablet Take 1 tablet by mouth  daily.        [name] NOVOLIN 70/30 RELION (70-30) 100 UNIT/ML injection INJECT 110 UNITS SUBCUTANEOUSLY WITH BREAKFAST AND 30 UNITS SUBCUTANEOUSLY WITH THE EVENING MEAL  50 mL  3   No current facility-administered medications on file prior to visit.    No Known Allergies  Family History  Problem Relation Age of Onset  . Diabetes Mother   . Diabetes Other     Sibling  . Diabetes Other     Sibling    BP 138/80  Pulse 90  Wt 340 lb (154.223 kg)  BMI 58.33 kg/m2  SpO2 95%    Review of Systems Denies LOC    Objective:   Physical Exam VITAL SIGNS:  See vs page.   GENERAL: no distress. Pulses: dorsalis pedis intact bilat.   Feet: no deformity.  no ulcer on the feet.  feet are of normal color and temp.  Trace bilat leg edema. There is bilateral onychomycosis and patchy hyperpigmentation.  Neuro: sensation is intact to touch on the feet.        Assessment & Plan:  DM: this is the best control this pt should aim for, given this regimen, which does match insulin to her changing needs throughout the day

## 2012-12-05 NOTE — Patient Instructions (Addendum)
pending the test results, please continue insulin: 110 units with breakfast, and 30 units with the evening meal.  check your blood sugar 2 times a day.  vary the time of day when you check, between before the 3 meals, and at bedtime.  also check if you have symptoms of your blood sugar being too high or too low.  please keep a record of the readings and bring it to your next appointment here.  please call us sooner if your blood sugar goes below 70, or if it stays over 200.  blood tests are being requested for you today.  We'll contact you with results.   good diet and exercise habits significanly improve the control of your diabetes.  please let me know if you wish to be referred to a dietician.  high blood sugar is very risky to your health.  you should see an eye doctor every year.  You are at higher than average risk for pneumonia and hepatitis-B.  You should be vaccinated against both.   controlling your blood pressure and cholesterol drastically reduces the damage diabetes does to your body.  this also applies to quitting smoking.  please discuss these with your doctor.  you should take an aspirin every day, unless you have been advised by a doctor not to.

## 2012-12-13 ENCOUNTER — Ambulatory Visit
Admission: RE | Admit: 2012-12-13 | Discharge: 2012-12-13 | Disposition: A | Discharge status: Home / Self Care | Payer: BC Managed Care – PPO | Source: Ambulatory Visit | Attending: Obstetrics and Gynecology | Admitting: Obstetrics and Gynecology

## 2012-12-13 DIAGNOSIS — R928 Other abnormal and inconclusive findings on diagnostic imaging of breast: Secondary | ICD-10-CM

## 2012-12-24 ENCOUNTER — Encounter (INDEPENDENT_AMBULATORY_CARE_PROVIDER_SITE_OTHER): Payer: Self-pay

## 2012-12-24 ENCOUNTER — Encounter (INDEPENDENT_AMBULATORY_CARE_PROVIDER_SITE_OTHER): Payer: Self-pay | Admitting: Surgery

## 2012-12-24 ENCOUNTER — Ambulatory Visit (INDEPENDENT_AMBULATORY_CARE_PROVIDER_SITE_OTHER): Payer: BC Managed Care – PPO | Admitting: Surgery

## 2012-12-24 VITALS — BP 150/82 | HR 93 | Temp 97.9°F | Resp 18 | Ht 64.0 in | Wt 341.8 lb

## 2012-12-24 DIAGNOSIS — R920 Mammographic microcalcification found on diagnostic imaging of breast: Secondary | ICD-10-CM | POA: Insufficient documentation

## 2012-12-24 NOTE — Progress Notes (Signed)
Patient ID: Stacey Wise, female   DOB: 09/02/1955, 57 y.o.   MRN: [id number]  Chief Complaint  Patient presents with  . New Evaluation    eval Lt br    HPI Stacey Wise is a 57 y.o. female.  Patient sent at request of Dr. Reed for left breast microcalcifications measuring 7 mm medial breast felt to be suspicious. Patient too large for stereotactic biopsy. Patient denies any breast pain, nipple discharge or new mass. HPI  Past Medical History  Diagnosis Date  . DIABETES MELLITUS, TYPE I 02/28/2007  . DIABETIC PERIPHERAL NEUROPATHY 02/28/2007  . DYSLIPIDEMIA 02/28/2007  . HYPERTENSION 02/28/2007  . SLEEP APNEA 02/28/2007  . Morbid obesity 02/28/2007  . Arthritis     Past Surgical History  Procedure Laterality Date  . Electrocardiogram  11/19/2004  . Tubal ligation  1996    Family History  Problem Relation Age of Onset  . Diabetes Mother   . Arthritis Mother   . Hypertension Mother   . Diabetes Other     Sibling  . Diabetes Other     Sibling  . Arthritis Father   . Cancer Father     Prostate  . Dementia Father   . Alzheimer's disease Father   . Cancer Sister     Lymphoma  . Arthritis Sister   . Arthritis Brother   . Cancer Sister     Peritoneal  . Arthritis Sister   . Arthritis Sister   . Arthritis Sister   . Arthritis Brother     Social History History  Substance Use Topics  . Smoking status: Never Smoker   . Smokeless tobacco: Never Used  . Alcohol Use: No    No Known Allergies  Current Outpatient Prescriptions  Medication Sig Dispense Refill  . aspirin 81 MG tablet Take 81 mg by mouth daily.      . atorvastatin (LIPITOR) 10 MG tablet Take 1 tablet (10 mg total) by mouth daily.  90 tablet  3  . diltiazem (TIAZAC) 300 MG 24 hr capsule TAKE ONE CAPSULE EVERY DAY  90 capsule  1  . lisinopril (PRINIVIL,ZESTRIL) 40 MG tablet Take 1 tablet (40 mg total) by mouth daily.  30 tablet  5  . norethindrone (ERRIN) 0.35 MG tablet Take 1 tablet by mouth daily.        .  NOVOLIN 70/30 RELION (70-30) 100 UNIT/ML injection INJECT 110 UNITS SUBCUTANEOUSLY WITH BREAKFAST AND 30 UNITS SUBCUTANEOUSLY WITH THE EVENING MEAL  50 mL  3   No current facility-administered medications for this visit.    Review of Systems Review of Systems  Constitutional: Negative for fever, chills and unexpected weight change.  HENT: Negative for hearing loss, congestion, sore throat, trouble swallowing and voice change.   Eyes: Negative for visual disturbance.  Respiratory: Negative for cough and wheezing.   Cardiovascular: Negative for chest pain, palpitations and leg swelling.  Gastrointestinal: Negative for nausea, vomiting, abdominal pain, diarrhea, constipation, blood in stool, abdominal distention and anal bleeding.  Genitourinary: Negative for hematuria, vaginal bleeding and difficulty urinating.  Musculoskeletal: Positive for arthralgias.  Skin: Negative for rash and wound.  Neurological: Negative for seizures, syncope and headaches.  Hematological: Negative for adenopathy. Does not bruise/bleed easily.  Psychiatric/Behavioral: Negative for confusion.    Blood pressure 150/82, pulse 93, temperature 97.9 F (36.6 C), temperature source Temporal, resp. rate 18, height 5' 4" (1.626 m), weight 341 lb 12.8 oz (155.039 kg).  Physical Exam Physical Exam  Constitutional: She is oriented to   person, place, and time. She appears well-developed and well-nourished.  HENT:  Head: Normocephalic and atraumatic.  Eyes: EOM are normal. Pupils are equal, round, and reactive to light.  Neck: Normal range of motion. Neck supple.  Cardiovascular: Normal rate and regular rhythm.   Pulmonary/Chest: Effort normal and breath sounds normal. Right breast exhibits no inverted nipple, no mass, no nipple discharge, no skin change and no tenderness. Left breast exhibits no inverted nipple, no mass, no nipple discharge, no skin change and no tenderness. Breasts are symmetrical.  Musculoskeletal: Normal  range of motion.  Neurological: She is alert and oriented to person, place, and time.  Skin: Skin is warm and dry.  Psychiatric: She has a normal mood and affect. Her behavior is normal. Judgment and thought content normal.    Data Reviewed Clinical Data: Left breast calcifications at recent screening  mammography.  DIGITAL DIAGNOSTIC LEFT MAMMOGRAM  Comparison: Previous examinations, including the screening  mammogram dated 11/29/2012.  Findings:  ACR Breast Density Category 2: There is a scattered fibroglandular  pattern.  Spot magnification views of the left breast demonstrate a linearly  arranged cluster of multiple tiny, irregular calcifications in the  medial aspect of the mid breast. No associated mass or  architectural distortion.  IMPRESSION:  7 mm cluster of calcifications in the mid medial left breast.  These are suspicious for the possibility of ductal carcinoma in  situ. Therefore, biopsy is recommended. The patient is too large  for stereotactic guided core needle biopsy (340 pounds).  Therefore, left breast mammographic wire localization and surgical  excision of the calcifications is recommended. The patient was  given a surgical consultation appointment with Dr. Essa Wenk at 1:50  p.m. on 12/24/2012.  RECOMMENDATION:  Left breast mammographic needle localization and excisional biopsy  (surgical consultation appointment scheduled).  I have discussed the findings and recommendations with the patient.  Results were also provided in writing at the conclusion of the  visit. If applicable, a reminder letter will be sent to the  patient regarding her next appointment.  BI-RADS CATEGORY 4: Suspicious abnormality - biopsy should be  considered.  Original Report Authenticated By: Steven Reid, M.D.   Assessment    Left breast calcifications suspicious medial     Plan    Left breast needle localized lumpectomy. Patient is too large for stereotactic table.The  procedure has been discussed with the patient. Alternatives to surgery have been discussed with the patient.  Risks of surgery include bleeding,  Infection,  Seroma formation, death,  and the need for further surgery.   The patient understands and wishes to proceed.       Amiir Heckard A. 12/24/2012, 2:22 PM    

## 2012-12-24 NOTE — Patient Instructions (Signed)
Lumpectomy, Breast Conserving Surgery A lumpectomy is breast surgery that removes only part of the breast. Another name used may be partial mastectomy. The amount removed varies. Make sure you understand how much of your breast will be removed. Reasons for a lumpectomy:  Any solid breast mass.  Grouped significant nodularity that may be confused with a solitary breast mass. Lumpectomy is the most common form of breast cancer surgery today. The surgeon removes the portion of your breast which contains the tumor (cancer). This is the lump. Some normal tissue around the lump is also removed to be sure that all the tumor has been removed.  If cancer cells are found in the margins where the breast tissue was removed, your surgeon will do more surgery to remove the remaining cancer tissue. This is called re-excision surgery. Radiation and/or chemotherapy treatments are often given following a lumpectomy to kill any cancer cells that could possibly remain.  REASONS YOU MAY NOT BE ABLE TO HAVE BREAST CONSERVING SURGERY:  The tumor is located in more than one place.  Your breast is small and the tumor is large so the breast would be disfigured.  The entire tumor removal is not successful with a lumpectomy.  You cannot commit to a full course of chemotherapy, radiation therapy or are pregnant and cannot have radiation.  You have previously had radiation to the breast to treat cancer. HOW A LUMPECTOMY IS PERFORMED If overnight nursing is not required following a biopsy, a lumpectomy can be performed as a same-day surgery. This can be done in a hospital, clinic, or surgical center. The anesthesia used will depend on your surgeon. They will discuss this with you. A general anesthetic keeps you sleeping through the procedure. LET YOUR CAREGIVERS KNOW ABOUT THE FOLLOWING:  Allergies  Medications taken including herbs, eye drops, over the counter medications, and creams.  Use of steroids (by mouth or  creams)  Previous problems with anesthetics or Novocaine.  Possibility of pregnancy, if this applies  History of blood clots (thrombophlebitis)  History of bleeding or blood problems.  Previous surgery  Other health problems BEFORE THE PROCEDURE You should be present one hour prior to your procedure unless directed otherwise.  AFTER THE PROCEDURE  After surgery, you will be taken to the recovery area where a nurse will watch and check your progress. Once you're awake, stable, and taking fluids well, barring other problems you will be allowed to go home.  Ice packs applied to your operative site may help with discomfort and keep the swelling down.  A small rubber drain may be placed in the breast for a couple of days to prevent a hematoma from developing in the breast.  A pressure dressing may be applied for 24 to 48 hours to prevent bleeding.  Keep the wound dry.  You may resume a normal diet and activities as directed. Avoid strenuous activities affecting the arm on the side of the biopsy site such as tennis, swimming, heavy lifting (more than 10 pounds) or pulling.  Bruising in the breast is normal following this procedure.  Wearing a bra - even to bed - may be more comfortable and also help keep the dressing on.  Change dressings as directed.  Only take over-the-counter or prescription medicines for pain, discomfort, or fever as directed by your caregiver. Call for your results as instructed by your surgeon. Remember it is your responsibility to get the results of your lumpectomy if your surgeon asked you to follow-up. Do not assume   everything is fine if you have not heard from your caregiver. SEEK MEDICAL CARE IF:   There is increased bleeding (more than a small spot) from the wound.  You notice redness, swelling, or increasing pain in the wound.  Pus is coming from wound.  An unexplained oral temperature above 102 F (38.9 C) develops.  You notice a foul smell  coming from the wound or dressing. SEEK IMMEDIATE MEDICAL CARE IF:   You develop a rash.  You have difficulty breathing.  You have any allergic problems. Document Released: 09/26/2006 Document Revised: 11/07/2011 Document Reviewed: 12/28/2006 [employer] Patient Information 2013 [employer].  

## 2012-12-27 ENCOUNTER — Encounter (HOSPITAL_BASED_OUTPATIENT_CLINIC_OR_DEPARTMENT_OTHER): Payer: Self-pay | Admitting: *Deleted

## 2012-12-27 NOTE — Progress Notes (Signed)
Pt also told to bring all meds and overnight bag due to non compliant sleep apnea

## 2012-12-27 NOTE — Progress Notes (Signed)
Pt denies any heart problems-had an echo 04-was normal Had a sleep studt AP 08-could not use a cpap-has not tried since-lives alone-to come in for bmet-ekg-will review with dr crews

## 2012-12-27 NOTE — Progress Notes (Signed)
Reviewed with dr crews-ok for here but would need to stay overnight-pt was told to bring meds and plan to stay if [facility]

## 2012-12-28 ENCOUNTER — Encounter (HOSPITAL_BASED_OUTPATIENT_CLINIC_OR_DEPARTMENT_OTHER)
Admission: RE | Admit: 2012-12-28 | Discharge: 2012-12-28 | Disposition: A | Discharge status: Home / Self Care | Payer: BC Managed Care – PPO | Source: Ambulatory Visit | Attending: Surgery | Admitting: Surgery

## 2012-12-28 LAB — BASIC METABOLIC PANEL
BUN: 17 mg/dL (ref 6–23)
CO2: 29 mEq/L (ref 19–32)
Calcium: 10.9 mg/dL — ABNORMAL HIGH (ref 8.4–10.5)
Chloride: 105 mEq/L (ref 96–112)
Creatinine, Ser: 0.89 mg/dL (ref 0.50–1.10)
GFR calc Af Amer: 82 mL/min — ABNORMAL LOW (ref >90)
GFR calc non Af Amer: 71 mL/min — ABNORMAL LOW (ref >90)
Glucose, Bld: 131 mg/dL — ABNORMAL HIGH (ref 70–99)
Potassium: 4.6 mEq/L (ref 3.5–5.1)
Sodium: 140 mEq/L (ref 135–145)

## 2012-12-28 NOTE — Progress Notes (Signed)
EKG reviewed by Dr Chaney Malling. No new orders received. Pt ok for surgery next week.

## 2013-01-01 ENCOUNTER — Encounter (HOSPITAL_BASED_OUTPATIENT_CLINIC_OR_DEPARTMENT_OTHER): Payer: Self-pay | Admitting: Certified Registered Nurse Anesthetist

## 2013-01-01 ENCOUNTER — Ambulatory Visit (HOSPITAL_BASED_OUTPATIENT_CLINIC_OR_DEPARTMENT_OTHER)
Admission: RE | Admit: 2013-01-01 | Discharge: 2013-01-01 | Disposition: A | Discharge status: Home / Self Care | Payer: BC Managed Care – PPO | Source: Ambulatory Visit | Attending: Surgery | Admitting: Surgery

## 2013-01-01 ENCOUNTER — Encounter (HOSPITAL_BASED_OUTPATIENT_CLINIC_OR_DEPARTMENT_OTHER)
Admission: RE | Disposition: A | Discharge status: Home / Self Care | Payer: Self-pay | Source: Ambulatory Visit | Attending: Surgery

## 2013-01-01 ENCOUNTER — Ambulatory Visit
Admission: RE | Admit: 2013-01-01 | Discharge: 2013-01-01 | Disposition: A | Discharge status: Home / Self Care | Payer: BC Managed Care – PPO | Source: Ambulatory Visit | Attending: Surgery | Admitting: Surgery

## 2013-01-01 ENCOUNTER — Ambulatory Visit (HOSPITAL_BASED_OUTPATIENT_CLINIC_OR_DEPARTMENT_OTHER): Payer: BC Managed Care – PPO | Admitting: Certified Registered Nurse Anesthetist

## 2013-01-01 DIAGNOSIS — M129 Arthropathy, unspecified: Secondary | ICD-10-CM | POA: Insufficient documentation

## 2013-01-01 DIAGNOSIS — E1142 Type 2 diabetes mellitus with diabetic polyneuropathy: Secondary | ICD-10-CM | POA: Insufficient documentation

## 2013-01-01 DIAGNOSIS — G4730 Sleep apnea, unspecified: Secondary | ICD-10-CM | POA: Insufficient documentation

## 2013-01-01 DIAGNOSIS — E785 Hyperlipidemia, unspecified: Secondary | ICD-10-CM | POA: Insufficient documentation

## 2013-01-01 DIAGNOSIS — D249 Benign neoplasm of unspecified breast: Secondary | ICD-10-CM

## 2013-01-01 DIAGNOSIS — Z7982 Long term (current) use of aspirin: Secondary | ICD-10-CM | POA: Insufficient documentation

## 2013-01-01 DIAGNOSIS — Z6841 Body Mass Index (BMI) 40.0 and over, adult: Secondary | ICD-10-CM | POA: Insufficient documentation

## 2013-01-01 DIAGNOSIS — R920 Mammographic microcalcification found on diagnostic imaging of breast: Secondary | ICD-10-CM

## 2013-01-01 DIAGNOSIS — Z794 Long term (current) use of insulin: Secondary | ICD-10-CM | POA: Insufficient documentation

## 2013-01-01 DIAGNOSIS — Z79899 Other long term (current) drug therapy: Secondary | ICD-10-CM | POA: Insufficient documentation

## 2013-01-01 DIAGNOSIS — I10 Essential (primary) hypertension: Secondary | ICD-10-CM | POA: Insufficient documentation

## 2013-01-01 DIAGNOSIS — E1049 Type 1 diabetes mellitus with other diabetic neurological complication: Secondary | ICD-10-CM | POA: Insufficient documentation

## 2013-01-01 HISTORY — DX: Family history of other specified conditions: Z84.89

## 2013-01-01 HISTORY — PX: BREAST LUMPECTOMY WITH NEEDLE LOCALIZATION: SHX5759

## 2013-01-01 LAB — POCT HEMOGLOBIN-HEMACUE: Hemoglobin: 13.5 g/dL (ref 12.0–15.0)

## 2013-01-01 LAB — GLUCOSE, CAPILLARY: Glucose-Capillary: 88 mg/dL (ref 70–99)

## 2013-01-01 SURGERY — BREAST LUMPECTOMY WITH NEEDLE LOCALIZATION
Anesthesia: General | Site: Breast | Laterality: Left | Wound class: Clean

## 2013-01-01 MED ORDER — 0.9 % SODIUM CHLORIDE (POUR BTL) OPTIME
TOPICAL | Status: DC | PRN
  Administered 2013-01-01: 200 mL

## 2013-01-01 MED ORDER — CHLORHEXIDINE GLUCONATE 4 % EX LIQD: 1.0000 "application " | Freq: Once | CUTANEOUS | Status: DC

## 2013-01-01 MED ORDER — BUPIVACAINE-EPINEPHRINE 0.25% -1:200000 IJ SOLN
INTRAMUSCULAR | Status: DC | PRN
  Administered 2013-01-01: 25 mL

## 2013-01-01 MED ORDER — OXYCODONE-ACETAMINOPHEN 5-325 MG PO TABS: 1.0000 | ORAL_TABLET | ORAL | Status: DC | PRN

## 2013-01-01 MED ORDER — LIDOCAINE HCL (CARDIAC) 20 MG/ML IV SOLN
INTRAVENOUS | Status: DC | PRN
  Administered 2013-01-01: 60 mg via INTRAVENOUS

## 2013-01-01 MED ORDER — FENTANYL CITRATE 0.05 MG/ML IJ SOLN: 50.0000 ug | INTRAMUSCULAR | Status: DC | PRN

## 2013-01-01 MED ORDER — OXYCODONE HCL 5 MG/5ML PO SOLN: 5.0000 mg | Freq: Once | ORAL | Status: AC | PRN

## 2013-01-01 MED ORDER — OXYCODONE HCL 5 MG PO TABS
5.0000 mg | ORAL_TABLET | Freq: Once | ORAL | Status: AC | PRN
  Administered 2013-01-01: 5 mg via ORAL

## 2013-01-01 MED ORDER — LACTATED RINGERS IV SOLN
INTRAVENOUS | Status: DC
  Administered 2013-01-01 (×2): via INTRAVENOUS

## 2013-01-01 MED ORDER — DEXAMETHASONE SODIUM PHOSPHATE 4 MG/ML IJ SOLN
INTRAMUSCULAR | Status: DC | PRN
  Administered 2013-01-01: 4 mg via INTRAVENOUS

## 2013-01-01 MED ORDER — FENTANYL CITRATE 0.05 MG/ML IJ SOLN
INTRAMUSCULAR | Status: DC | PRN
  Administered 2013-01-01 (×2): 50 ug via INTRAVENOUS

## 2013-01-01 MED ORDER — HYDROMORPHONE HCL PF 1 MG/ML IJ SOLN: 0.2500 mg | INTRAMUSCULAR | Status: DC | PRN

## 2013-01-01 MED ORDER — MIDAZOLAM HCL 2 MG/2ML IJ SOLN: 1.0000 mg | INTRAMUSCULAR | Status: DC | PRN

## 2013-01-01 MED ORDER — PROPOFOL 10 MG/ML IV BOLUS
INTRAVENOUS | Status: DC | PRN
  Administered 2013-01-01: 200 mg via INTRAVENOUS

## 2013-01-01 MED ORDER — ONDANSETRON HCL 4 MG/2ML IJ SOLN: 4.0000 mg | Freq: Once | INTRAMUSCULAR | Status: DC | PRN

## 2013-01-01 MED ORDER — MIDAZOLAM HCL 5 MG/5ML IJ SOLN
INTRAMUSCULAR | Status: DC | PRN
  Administered 2013-01-01: 1 mg via INTRAVENOUS

## 2013-01-01 MED ORDER — ONDANSETRON HCL 4 MG/2ML IJ SOLN
INTRAMUSCULAR | Status: DC | PRN
  Administered 2013-01-01: 4 mg via INTRAVENOUS

## 2013-01-01 MED ORDER — DEXTROSE 5 % IV SOLN: 3.0000 g | INTRAVENOUS | Status: DC

## 2013-01-01 SURGICAL SUPPLY — 45 items
BINDER BREAST LRG (GAUZE/BANDAGES/DRESSINGS) IMPLANT
BINDER BREAST MEDIUM (GAUZE/BANDAGES/DRESSINGS) IMPLANT
BINDER BREAST XLRG (GAUZE/BANDAGES/DRESSINGS) IMPLANT
BINDER BREAST XXLRG (GAUZE/BANDAGES/DRESSINGS) IMPLANT
BLADE SURG 15 STRL LF DISP TIS (BLADE) ×1 IMPLANT
BLADE SURG 15 STRL SS (BLADE) ×1
CANISTER SUCTION 1200CC (MISCELLANEOUS) ×2 IMPLANT
CHLORAPREP W/TINT 26ML (MISCELLANEOUS) ×2 IMPLANT
CLIP TI WIDE RED SMALL 6 (CLIP) IMPLANT
CLOTH BEACON ORANGE TIMEOUT ST (SAFETY) ×2 IMPLANT
COVER MAYO STAND STRL (DRAPES) ×2 IMPLANT
COVER TABLE BACK 60X90 (DRAPES) ×2 IMPLANT
DECANTER SPIKE VIAL GLASS SM (MISCELLANEOUS) ×2 IMPLANT
DERMABOND ADVANCED (GAUZE/BANDAGES/DRESSINGS) ×1
DERMABOND ADVANCED .7 DNX12 (GAUZE/BANDAGES/DRESSINGS) ×1 IMPLANT
DEVICE DUBIN W/COMP PLATE 8390 (MISCELLANEOUS) IMPLANT
DRAPE LAPAROSCOPIC ABDOMINAL (DRAPES) IMPLANT
DRAPE PED LAPAROTOMY (DRAPES) ×2 IMPLANT
DRAPE UTILITY XL STRL (DRAPES) ×2 IMPLANT
ELECT COATED BLADE 2.86 ST (ELECTRODE) ×2 IMPLANT
ELECT REM PT RETURN 9FT ADLT (ELECTROSURGICAL) ×2
ELECTRODE REM PT RTRN 9FT ADLT (ELECTROSURGICAL) ×1 IMPLANT
GLOVE BIO SURGEON STRL SZ 6.5 (GLOVE) ×2 IMPLANT
GLOVE BIOGEL PI IND STRL 8 (GLOVE) ×1 IMPLANT
GLOVE BIOGEL PI INDICATOR 8 (GLOVE) ×1
GLOVE ECLIPSE 6.5 STRL STRAW (GLOVE) ×2 IMPLANT
GLOVE ECLIPSE 8.0 STRL XLNG CF (GLOVE) ×2 IMPLANT
GLOVE INDICATOR 7.0 STRL GRN (GLOVE) ×2 IMPLANT
GOWN PREVENTION PLUS XLARGE (GOWN DISPOSABLE) ×2 IMPLANT
KIT MARKER MARGIN INK (KITS) ×2 IMPLANT
NEEDLE HYPO 25X1 1.5 SAFETY (NEEDLE) ×2 IMPLANT
NS IRRIG 1000ML POUR BTL (IV SOLUTION) ×2 IMPLANT
PACK BASIN DAY SURGERY FS (CUSTOM PROCEDURE TRAY) ×2 IMPLANT
PENCIL BUTTON HOLSTER BLD 10FT (ELECTRODE) ×2 IMPLANT
SLEEVE SCD COMPRESS KNEE MED (MISCELLANEOUS) ×2 IMPLANT
SPONGE LAP 4X18 X RAY DECT (DISPOSABLE) ×2 IMPLANT
SUT MON AB 4-0 PC3 18 (SUTURE) ×2 IMPLANT
SUT SILK 2 0 SH (SUTURE) IMPLANT
SUT VIC AB 3-0 SH 27 (SUTURE) ×1
SUT VIC AB 3-0 SH 27X BRD (SUTURE) ×1 IMPLANT
SYR CONTROL 10ML LL (SYRINGE) ×2 IMPLANT
TOWEL OR 17X24 6PK STRL BLUE (TOWEL DISPOSABLE) ×4 IMPLANT
TOWEL OR NON WOVEN STRL DISP B (DISPOSABLE) ×2 IMPLANT
TUBE CONNECTING 20X1/4 (TUBING) ×2 IMPLANT
YANKAUER SUCT BULB TIP NO VENT (SUCTIONS) ×2 IMPLANT

## 2013-01-01 NOTE — Transfer of Care (Signed)
Immediate Anesthesia Transfer of Care Note  Patient: Stacey Wise  Procedure(s) Performed: Procedure(s): LEFT BREAST LUMPECTOMY WITH NEEDLE LOCALIZATION (Left)  Patient Location: PACU  Anesthesia Type:General  Level of Consciousness: awake, alert , oriented and patient cooperative  Airway & Oxygen Therapy: Patient Spontanous Breathing and Patient connected to face mask oxygen  Post-op Assessment: Report given to PACU RN and Post -op Vital signs reviewed and stable  Post vital signs: Reviewed and stable  Complications: No apparent anesthesia complications

## 2013-01-01 NOTE — Anesthesia Postprocedure Evaluation (Signed)
  Anesthesia Post-op Note  Patient: Stacey Wise  Procedure(s) Performed: Procedure(s): LEFT BREAST LUMPECTOMY WITH NEEDLE LOCALIZATION (Left)  Patient Location: PACU  Anesthesia Type:General  Level of Consciousness: awake, alert  and oriented  Airway and Oxygen Therapy: Patient Spontanous Breathing and Patient connected to face mask oxygen  Post-op Pain: mild  Post-op Assessment: Post-op Vital signs reviewed  Post-op Vital Signs: Reviewed  Complications: No apparent anesthesia complications

## 2013-01-01 NOTE — Op Note (Signed)
Preoperative diagnosis: left breast microcalcifications  Postop diagnosis: Same  Procedure: left breast lumpectomy  with wire localization  Surgeon: Harriette Bouillon M.D.  Anesthesia: LMA with 0.25% Sensorcaine local  EBL: Less than 40 cc  Specimen:  Left Breast  Calcifications mass with wire and clip verified by radiography to pathology  Drains: None  Indications for procedure: The patient presents with  breast microcalcifications that are suspicious. . Core biopsy could not be done so excisional biopsy recommended.  The patient was to proceed with left breast lumpectomy  with wire localization.  Description of procedure: The patient was seen in the holding area and the appropriate side was marked. Questions are answered. Wire localization was done by  radiology. The patient was taken back to the operating room and placed supine on the operating room table. After induction of general anesthesia,  Left chest and upper arm  were prepped and draped in a sterile fashion. Timeout was done and she received preoperative antibiotics. Curvilinear incision was made around the wire insertion site in the medial breast. All tissue around the wire was excised and hemostasis was achieved with cautery. The area was removed in its entirety upon gross examination. Gross margin negative. Radiograph revealed the calcifications, wire and clip to be in the specimen. The wound was closed in layers using 3-0 Vicryl and 4-0 Monocryl subcuticular stitch. Dermabond applied. All final counts found to be correct. Patient awoke extubated taken recovery in satisfactory condition.

## 2013-01-01 NOTE — H&P (View-Only) (Signed)
Patient ID: Stacey Wise, female   DOB: 1955/12/26, 57 y.o.   MRN: [id number]  Chief Complaint  Patient presents with  . New Evaluation    eval Lt br    HPI Stacey Wise is a 57 y.o. female.  Patient sent at request of Dr. Renato Gails for left breast microcalcifications measuring 7 mm medial breast felt to be suspicious. Patient too large for stereotactic biopsy. Patient denies any breast pain, nipple discharge or new mass. HPI  Past Medical History  Diagnosis Date  . DIABETES MELLITUS, TYPE I 02/28/2007  . DIABETIC PERIPHERAL NEUROPATHY 02/28/2007  . DYSLIPIDEMIA 02/28/2007  . HYPERTENSION 02/28/2007  . SLEEP APNEA 02/28/2007  . Morbid obesity 02/28/2007  . Arthritis     Past Surgical History  Procedure Laterality Date  . Electrocardiogram  11/19/2004  . Tubal ligation  1996    Family History  Problem Relation Age of Onset  . Diabetes Mother   . Arthritis Mother   . Hypertension Mother   . Diabetes Other     Sibling  . Diabetes Other     Sibling  . Arthritis Father   . Cancer Father     Prostate  . Dementia Father   . Alzheimer's disease Father   . Cancer Sister     Lymphoma  . Arthritis Sister   . Arthritis Brother   . Cancer Sister     Peritoneal  . Arthritis Sister   . Arthritis Sister   . Arthritis Sister   . Arthritis Brother     Social History History  Substance Use Topics  . Smoking status: Never Smoker   . Smokeless tobacco: Never Used  . Alcohol Use: No    No Known Allergies  Current Outpatient Prescriptions  Medication Sig Dispense Refill  . aspirin 81 MG tablet Take 81 mg by mouth daily.      [name] atorvastatin (LIPITOR) 10 MG tablet Take 1 tablet (10 mg total) by mouth daily.  90 tablet  3  . diltiazem (TIAZAC) 300 MG 24 hr capsule TAKE ONE CAPSULE EVERY DAY  90 capsule  1  . lisinopril (PRINIVIL,ZESTRIL) 40 MG tablet Take 1 tablet (40 mg total) by mouth daily.  30 tablet  5  . norethindrone (ERRIN) 0.35 MG tablet Take 1 tablet by mouth daily.        [name]  NOVOLIN 70/30 RELION (70-30) 100 UNIT/ML injection INJECT 110 UNITS SUBCUTANEOUSLY WITH BREAKFAST AND 30 UNITS SUBCUTANEOUSLY WITH THE EVENING MEAL  50 mL  3   No current facility-administered medications for this visit.    Review of Systems Review of Systems  Constitutional: Negative for fever, chills and unexpected weight change.  HENT: Negative for hearing loss, congestion, sore throat, trouble swallowing and voice change.   Eyes: Negative for visual disturbance.  Respiratory: Negative for cough and wheezing.   Cardiovascular: Negative for chest pain, palpitations and leg swelling.  Gastrointestinal: Negative for nausea, vomiting, abdominal pain, diarrhea, constipation, blood in stool, abdominal distention and anal bleeding.  Genitourinary: Negative for hematuria, vaginal bleeding and difficulty urinating.  Musculoskeletal: Positive for arthralgias.  Skin: Negative for rash and wound.  Neurological: Negative for seizures, syncope and headaches.  Hematological: Negative for adenopathy. Does not bruise/bleed easily.  Psychiatric/Behavioral: Negative for confusion.    Blood pressure 150/82, pulse 93, temperature 97.9 F (36.6 C), temperature source Temporal, resp. rate 18, height 5\' 4"  (1.626 m), weight 341 lb 12.8 oz (155.039 kg).  Physical Exam Physical Exam  Constitutional: She is oriented to  person, place, and time. She appears well-developed and well-nourished.  HENT:  Head: Normocephalic and atraumatic.  Eyes: EOM are normal. Pupils are equal, round, and reactive to light.  Neck: Normal range of motion. Neck supple.  Cardiovascular: Normal rate and regular rhythm.   Pulmonary/Chest: Effort normal and breath sounds normal. Right breast exhibits no inverted nipple, no mass, no nipple discharge, no skin change and no tenderness. Left breast exhibits no inverted nipple, no mass, no nipple discharge, no skin change and no tenderness. Breasts are symmetrical.  Musculoskeletal: Normal  range of motion.  Neurological: She is alert and oriented to person, place, and time.  Skin: Skin is warm and dry.  Psychiatric: She has a normal mood and affect. Her behavior is normal. Judgment and thought content normal.    Data Reviewed Clinical Data: Left breast calcifications at recent screening  mammography.  DIGITAL DIAGNOSTIC LEFT MAMMOGRAM  Comparison: Previous examinations, including the screening  mammogram dated 11/29/2012.  Findings:  ACR Breast Density Category 2: There is a scattered fibroglandular  pattern.  Spot magnification views of the left breast demonstrate a linearly  arranged cluster of multiple tiny, irregular calcifications in the  medial aspect of the mid breast. No associated mass or  architectural distortion.  IMPRESSION:  7 mm cluster of calcifications in the mid medial left breast.  These are suspicious for the possibility of ductal carcinoma in  situ. Therefore, biopsy is recommended. The patient is too large  for stereotactic guided core needle biopsy (340 pounds).  Therefore, left breast mammographic wire localization and surgical  excision of the calcifications is recommended. The patient was  given a surgical consultation appointment with Dr. Luisa Hart at 1:50  p.m. on 12/24/2012.  RECOMMENDATION:  Left breast mammographic needle localization and excisional biopsy  (surgical consultation appointment scheduled).  I have discussed the findings and recommendations with the patient.  Results were also provided in writing at the conclusion of the  visit. If applicable, a reminder letter will be sent to the  patient regarding her next appointment.  BI-RADS CATEGORY 4: Suspicious abnormality - biopsy should be  considered.  Original Report Authenticated By: Beckie Salts, M.D.   Assessment    Left breast calcifications suspicious medial     Plan    Left breast needle localized lumpectomy. Patient is too large for stereotactic table.The  procedure has been discussed with the patient. Alternatives to surgery have been discussed with the patient.  Risks of surgery include bleeding,  Infection,  Seroma formation, death,  and the need for further surgery.   The patient understands and wishes to proceed.       Aleck Locklin A. 12/24/2012, 2:22 PM

## 2013-01-01 NOTE — Anesthesia Procedure Notes (Signed)
Procedure Name: LMA Insertion Date/Time: 01/01/2013 9:20 AM Performed by: Danissa Rundle D Pre-anesthesia Checklist: Patient identified, Emergency Drugs available, Suction available and Patient being monitored Patient Re-evaluated:Patient Re-evaluated prior to inductionOxygen Delivery Method: Circle System Utilized Preoxygenation: Pre-oxygenation with 100% oxygen Intubation Type: IV induction Ventilation: Mask ventilation without difficulty LMA: LMA inserted LMA Size: 4.0 Number of attempts: 1 Airway Equipment and Method: bite block Placement Confirmation: positive ETCO2 Tube secured with: Tape Dental Injury: Teeth and Oropharynx as per pre-operative assessment

## 2013-01-01 NOTE — Anesthesia Preprocedure Evaluation (Signed)
Anesthesia Evaluation  Patient identified by MRN, date of birth, ID band Patient awake    Reviewed: Allergy & Precautions, H&P , NPO status , Patient's Chart, lab work & pertinent test results  Airway Mallampati: I TM Distance: >3 FB Neck ROM: Full    Dental   Pulmonary  breath sounds clear to auscultation        Cardiovascular hypertension, Pt. on medications Rhythm:Regular Rate:Normal     Neuro/Psych    GI/Hepatic   Endo/Other  diabetes, Well Controlled, Type 2, Insulin DependentMorbid obesity  Renal/GU      Musculoskeletal   Abdominal   Peds  Hematology   Anesthesia Other Findings   Reproductive/Obstetrics                           Anesthesia Physical Anesthesia Plan  ASA: III  Anesthesia Plan: General   Post-op Pain Management:    Induction: Intravenous  Airway Management Planned: LMA  Additional Equipment:   Intra-op Plan:   Post-operative Plan: Extubation in OR  Informed Consent: I have reviewed the patients History and Physical, chart, labs and discussed the procedure including the risks, benefits and alternatives for the proposed anesthesia with the patient or authorized representative who has indicated his/her understanding and acceptance.   Dental advisory given  Plan Discussed with: CRNA, Anesthesiologist and Surgeon  Anesthesia Plan Comments:         Anesthesia Quick Evaluation

## 2013-01-01 NOTE — Interval H&P Note (Signed)
History and Physical Interval Note:  01/01/2013 8:36 AM  Stacey Wise  has presented today for surgery, with the diagnosis of left breast microcalification  The various methods of treatment have been discussed with the patient and family. After consideration of risks, benefits and other options for treatment, the patient has consented to  Procedure(s): LEFT BREAST LUMPECTOMY WITH NEEDLE LOCALIZATION (Left) as a surgical intervention .  The patient's history has been reviewed, patient examined, no change in status, stable for surgery.  I have reviewed the patient's chart and labs.  Questions were answered to the patient's satisfaction.     Muhammed Teutsch A.

## 2013-01-02 ENCOUNTER — Encounter (HOSPITAL_BASED_OUTPATIENT_CLINIC_OR_DEPARTMENT_OTHER): Payer: Self-pay | Admitting: Surgery

## 2013-01-03 ENCOUNTER — Telehealth (INDEPENDENT_AMBULATORY_CARE_PROVIDER_SITE_OTHER): Payer: Self-pay | Admitting: *Deleted

## 2013-01-03 NOTE — Telephone Encounter (Signed)
Patient called to ask about path results however none are seen at this time.

## 2013-01-03 NOTE — Telephone Encounter (Signed)
Patient given path results at this time. Negative and benign

## 2013-01-25 ENCOUNTER — Encounter (INDEPENDENT_AMBULATORY_CARE_PROVIDER_SITE_OTHER): Payer: Self-pay | Admitting: Surgery

## 2013-01-25 ENCOUNTER — Ambulatory Visit (INDEPENDENT_AMBULATORY_CARE_PROVIDER_SITE_OTHER): Payer: BC Managed Care – PPO | Admitting: Surgery

## 2013-01-25 VITALS — BP 140/84 | HR 88 | Temp 97.6°F | Resp 16 | Ht 64.0 in | Wt 343.2 lb

## 2013-01-25 DIAGNOSIS — Z9889 Other specified postprocedural states: Secondary | ICD-10-CM

## 2013-01-25 NOTE — Patient Instructions (Signed)
Return as needed

## 2013-01-25 NOTE — Progress Notes (Signed)
Patient returns  After left breast needle localized lumpectomy for microcalcifications which were benign. She has mild swelling at the incision otherwise no complaints.  Exam: Left breast incision clean dry and intact without redness or seroma.  Impression: Status post left breast lumpectomy for microcalcifications benign  Plan: Return as needed. Resume yearly mammography next year

## 2013-02-28 ENCOUNTER — Other Ambulatory Visit: Payer: Self-pay | Admitting: *Deleted

## 2013-02-28 MED ORDER — LISINOPRIL 40 MG PO TABS: 40.0000 mg | ORAL_TABLET | Freq: Every day | ORAL | Status: DC

## 2013-05-02 ENCOUNTER — Telehealth: Payer: Self-pay | Admitting: Endocrinology

## 2013-05-02 NOTE — Telephone Encounter (Signed)
rx called in to [facility], spoke with pharmacist

## 2013-05-03 ENCOUNTER — Other Ambulatory Visit: Payer: Self-pay | Admitting: *Deleted

## 2013-05-03 ENCOUNTER — Other Ambulatory Visit: Payer: Self-pay

## 2013-05-03 MED ORDER — INSULIN NPH ISOPHANE & REGULAR (70-30) 100 UNIT/ML ~~LOC~~ SUSP: SUBCUTANEOUS | Status: DC

## 2013-05-15 ENCOUNTER — Ambulatory Visit (INDEPENDENT_AMBULATORY_CARE_PROVIDER_SITE_OTHER): Payer: BC Managed Care – PPO | Admitting: Endocrinology

## 2013-05-15 ENCOUNTER — Encounter: Payer: Self-pay | Admitting: Endocrinology

## 2013-05-15 VITALS — BP 140/78 | HR 87 | Ht 63.0 in | Wt 341.0 lb

## 2013-05-15 DIAGNOSIS — E1049 Type 1 diabetes mellitus with other diabetic neurological complication: Secondary | ICD-10-CM

## 2013-05-15 DIAGNOSIS — E109 Type 1 diabetes mellitus without complications: Secondary | ICD-10-CM

## 2013-05-15 LAB — MICROALBUMIN / CREATININE URINE RATIO
Creatinine,U: 136.6 mg/dL
Microalb Creat Ratio: 1.4 mg/g (ref 0.0–30.0)
Microalb, Ur: 1.9 mg/dL (ref 0.0–1.9)

## 2013-05-15 LAB — HEMOGLOBIN A1C: Hgb A1c MFr Bld: 8.3 % — ABNORMAL HIGH (ref 4.6–6.5)

## 2013-05-15 NOTE — Progress Notes (Signed)
Subjective:    Patient ID: Stacey Wise, female    DOB: [birth year], 57 y.o.   MRN: [id number]  HPI Pt returns for f/u of insulin-requiring DM (dx'ed 1996; she has moderate neuropathy of the lower extremities; no associated chronic complications; she is on bid premixed human insulin for cost and simplicity; she has never had severe hypoglycemia or DKA).  no cbg record, but states cbg's are well-controlled.  She seldom has hypoglycemia, and these episodes are mild.  This usually happens in the afternoon, after a smaller-than-expected lunch.  She recently finished a steroid "pack," for back pain.  She says this increased her cbg's to the high-100's.   Past Medical History  Diagnosis Date  . DIABETES MELLITUS, TYPE I 02/28/2007  . DIABETIC PERIPHERAL NEUROPATHY 02/28/2007  . DYSLIPIDEMIA 02/28/2007  . HYPERTENSION 02/28/2007  . Morbid obesity 02/28/2007  . Arthritis   . SLEEP APNEA 02/28/2007    does not use a cpap-could not use  . Family history of anesthesia complication     sister hard to wake up    Past Surgical History  Procedure Laterality Date  . Electrocardiogram  11/19/2004  . Tubal ligation  1996  . Dilation and curettage of uterus  2009  . Cervical biopsy  w/ loop electrode excision    . Breast lumpectomy with needle localization Left 01/01/2013    Procedure: LEFT BREAST LUMPECTOMY WITH NEEDLE LOCALIZATION;  Surgeon: Maisie Fus A. Cornett, MD;  Location: [facility];  Service: General;  Laterality: Left;    History   Social History  . Marital Status: Single    Spouse Name: N/A    Number of Children: N/A  . Years of Education: N/A   Occupational History  . 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 Smoking status: Never Smoker   . Smokeless tobacco: Never Used  . Alcohol Use: No  . Drug Use: No  . Sexual Activity: Not on file   Other Topics Concern  . Not on file   Social History Narrative  . No narrative on file    Current  Outpatient Prescriptions on File Prior to Visit  Medication Sig Dispense Refill  . aspirin 81 MG tablet Take 81 mg by mouth daily.      [name] atorvastatin (LIPITOR) 10 MG tablet Take 1 tablet (10 mg total) by mouth daily.  90 tablet  3  . diltiazem (TIAZAC) 300 MG 24 hr capsule TAKE ONE CAPSULE EVERY DAY  90 capsule  1  . insulin NPH-regular (NOVOLIN 70/30 RELION) (70-30) 100 UNIT/ML injection INJECT 110 UNITS SUBCUTANEOUSLY WITH BREAKFAST AND 30 UNITS WITH THE EVENING MEAL.  40 mL  2  . lisinopril (PRINIVIL,ZESTRIL) 40 MG tablet Take 1 tablet (40 mg total) by mouth daily.  30 tablet  4  . norethindrone (ERRIN) 0.35 MG tablet Take 1 tablet by mouth daily.        [name] oxyCODONE-acetaminophen (ROXICET) 5-325 MG per tablet Take 1 tablet by mouth every 4 (four) hours as needed for pain.  30 tablet  0   No current facility-administered medications on file prior to visit.    No Known Allergies  Family History  Problem Relation Age of Onset  . Diabetes Mother   . Arthritis Mother   . Hypertension Mother   . Diabetes Other     Sibling  . Diabetes Other     Sibling  . Arthritis Father   . Cancer Father  Prostate  . Dementia Father   . Alzheimer's disease Father   . Cancer Sister     Lymphoma  . Arthritis Sister   . Arthritis Brother   . Cancer Sister     Peritoneal  . Arthritis Sister   . Arthritis Sister   . Arthritis Sister   . Arthritis Brother    BP 140/78  Pulse 87  Ht 5\' 3"  (1.6 m)  Wt 341 lb (154.677 kg)  BMI 60.42 kg/m2  SpO2 95%  Review of Systems denies LOC and weight change.        Objective:   Physical Exam VITAL SIGNS:  See vs page.   GENERAL: no distress.  Lab Results  Component Value Date   HGBA1C 8.3* 05/15/2013      Assessment & Plan:  DM: a1c is elev prob due to the steroids Low-back pain: this limits exercise rx of DM, and its rx worsens glycemic control. Morbid obesity: this complicates the rx of DM.

## 2013-05-15 NOTE — Patient Instructions (Addendum)
pending the test results, please continue insulin: 110 units with breakfast, and 30 units with the evening meal.  check your blood sugar 2 times a day.  vary the time of day when you check, between before the 3 meals, and at bedtime.  also check if you have symptoms of your blood sugar being too high or too low.  please keep a record of the readings and bring it to your next appointment here.  please call us sooner if your blood sugar goes below 70, or if it stays over 200.   blood and urine tests are being requested for you today.  We'll contact you with results.  On this type of insulin schedule, you should eat meals on a regular schedule.  If a meal is missed or significantly delayed, your blood sugar could go low.   On the night before your cataract surgery, take just half the usual insulin.  Also take half the morning insulin the day of your surgery, too.

## 2013-06-11 ENCOUNTER — Encounter: Payer: Self-pay | Admitting: Endocrinology

## 2013-06-11 ENCOUNTER — Other Ambulatory Visit (HOSPITAL_COMMUNITY): Payer: Self-pay | Admitting: Sports Medicine

## 2013-06-11 DIAGNOSIS — M545 Low back pain, unspecified: Secondary | ICD-10-CM

## 2013-06-18 ENCOUNTER — Encounter (HOSPITAL_COMMUNITY)
Admission: RE | Admit: 2013-06-18 | Discharge: 2013-06-18 | Disposition: A | Discharge status: Home / Self Care | Payer: BC Managed Care – PPO | Source: Ambulatory Visit | Attending: Sports Medicine | Admitting: Sports Medicine

## 2013-06-18 DIAGNOSIS — M545 Low back pain, unspecified: Secondary | ICD-10-CM | POA: Insufficient documentation

## 2013-06-18 MED ORDER — TECHNETIUM TC 99M MEDRONATE IV KIT
25.0000 | PACK | Freq: Once | INTRAVENOUS | Status: AC | PRN
  Administered 2013-06-18: 25 via INTRAVENOUS

## 2013-06-20 ENCOUNTER — Ambulatory Visit (HOSPITAL_COMMUNITY): Payer: BC Managed Care – PPO

## 2013-06-20 ENCOUNTER — Encounter (HOSPITAL_COMMUNITY): Payer: BC Managed Care – PPO

## 2013-07-19 ENCOUNTER — Other Ambulatory Visit: Payer: Self-pay | Admitting: *Deleted

## 2013-07-19 MED ORDER — DILTIAZEM HCL ER BEADS 300 MG PO CP24: ORAL_CAPSULE | ORAL | Status: DC

## 2013-07-22 ENCOUNTER — Other Ambulatory Visit: Payer: Self-pay | Admitting: *Deleted

## 2013-07-22 NOTE — Telephone Encounter (Signed)
Opened in error [place]

## 2013-07-31 ENCOUNTER — Other Ambulatory Visit: Payer: Self-pay

## 2013-07-31 MED ORDER — LISINOPRIL 40 MG PO TABS: 40.0000 mg | ORAL_TABLET | Freq: Every day | ORAL | Status: DC

## 2013-07-31 NOTE — Telephone Encounter (Signed)
Medication sent to [facility]. /met

## 2013-10-12 ENCOUNTER — Other Ambulatory Visit: Payer: Self-pay | Admitting: Endocrinology

## 2013-11-26 ENCOUNTER — Other Ambulatory Visit: Payer: Self-pay

## 2013-11-26 MED ORDER — INSULIN NPH ISOPHANE & REGULAR (70-30) 100 UNIT/ML ~~LOC~~ SUSP: SUBCUTANEOUS | Status: DC

## 2013-12-13 ENCOUNTER — Encounter: Payer: Self-pay | Admitting: Endocrinology

## 2013-12-13 ENCOUNTER — Other Ambulatory Visit: Payer: Self-pay

## 2013-12-13 ENCOUNTER — Ambulatory Visit (INDEPENDENT_AMBULATORY_CARE_PROVIDER_SITE_OTHER): Payer: BC Managed Care – PPO | Admitting: Endocrinology

## 2013-12-13 VITALS — BP 118/70 | HR 96 | Temp 98.3°F | Ht 63.0 in | Wt 345.0 lb

## 2013-12-13 DIAGNOSIS — Z0000 Encounter for general adult medical examination without abnormal findings: Secondary | ICD-10-CM

## 2013-12-13 DIAGNOSIS — E109 Type 1 diabetes mellitus without complications: Secondary | ICD-10-CM

## 2013-12-13 DIAGNOSIS — I10 Essential (primary) hypertension: Secondary | ICD-10-CM

## 2013-12-13 DIAGNOSIS — Z79899 Other long term (current) drug therapy: Secondary | ICD-10-CM

## 2013-12-13 DIAGNOSIS — R9431 Abnormal electrocardiogram [ECG] [EKG]: Secondary | ICD-10-CM

## 2013-12-13 LAB — BASIC METABOLIC PANEL
BUN: 23 mg/dL (ref 6–23)
CO2: 26 mEq/L (ref 19–32)
Calcium: 10.8 mg/dL — ABNORMAL HIGH (ref 8.4–10.5)
Chloride: 108 mEq/L (ref 96–112)
Creatinine, Ser: 0.9 mg/dL (ref 0.4–1.2)
GFR: 82.99 mL/min (ref >60.00)
Glucose, Bld: 146 mg/dL — ABNORMAL HIGH (ref 70–99)
Potassium: 4 mEq/L (ref 3.5–5.1)
Sodium: 140 mEq/L (ref 135–145)

## 2013-12-13 LAB — CBC WITH DIFFERENTIAL/PLATELET
Basophils Absolute: 0 10*3/uL (ref 0.0–0.1)
Basophils Relative: 0.3 % (ref 0.0–3.0)
Eosinophils Absolute: 0.4 10*3/uL (ref 0.0–0.7)
Eosinophils Relative: 3.3 % (ref 0.0–5.0)
HCT: 39.8 % (ref 36.0–46.0)
Hemoglobin: 13 g/dL (ref 12.0–15.0)
Lymphocytes Relative: 13.1 % (ref 12.0–46.0)
Lymphs Abs: 1.7 10*3/uL (ref 0.7–4.0)
MCHC: 32.6 g/dL (ref 30.0–36.0)
MCV: 84.7 fl (ref 78.0–100.0)
Monocytes Absolute: 0.9 10*3/uL (ref 0.1–1.0)
Monocytes Relative: 7.3 % (ref 3.0–12.0)
Neutro Abs: 9.6 10*3/uL — ABNORMAL HIGH (ref 1.4–7.7)
Neutrophils Relative %: 76 % (ref 43.0–77.0)
Platelets: 275 10*3/uL (ref 150.0–400.0)
RBC: 4.7 Mil/uL (ref 3.87–5.11)
RDW: 14.6 % (ref 11.5–14.6)
WBC: 12.6 10*3/uL — ABNORMAL HIGH (ref 4.5–10.5)

## 2013-12-13 LAB — TSH: TSH: 1.2 u[IU]/mL (ref 0.35–5.50)

## 2013-12-13 LAB — HEMOGLOBIN A1C: Hgb A1c MFr Bld: 7.7 % — ABNORMAL HIGH (ref 4.6–6.5)

## 2013-12-13 LAB — MICROALBUMIN / CREATININE URINE RATIO
Creatinine,U: 73.8 mg/dL
Microalb Creat Ratio: 0.4 mg/g (ref 0.0–30.0)
Microalb, Ur: 0.3 mg/dL (ref 0.0–1.9)

## 2013-12-13 LAB — LIPID PANEL
Cholesterol: 133 mg/dL (ref 0–200)
HDL: 38.6 mg/dL — ABNORMAL LOW (ref >39.00)
LDL Cholesterol: 74 mg/dL (ref 0–99)
Total CHOL/HDL Ratio: 3
Triglycerides: 104 mg/dL (ref 0.0–149.0)
VLDL: 20.8 mg/dL (ref 0.0–40.0)

## 2013-12-13 MED ORDER — GLUCOSE BLOOD VI STRP: 1.0000 | ORAL_STRIP | Freq: Two times a day (BID) | Status: DC

## 2013-12-13 NOTE — Progress Notes (Signed)
Subjective:    Patient ID: Stacey Wise, female    DOB: 1956-06-10, 58 y.o.   MRN: [id number]  HPI Pt is here for regular wellness examination, and is feeling pretty well in general, and says chronic med probs are stable, except as noted below Past Medical History  Diagnosis Date  . DIABETES MELLITUS, TYPE I 02/28/2007  . DIABETIC PERIPHERAL NEUROPATHY 02/28/2007  . DYSLIPIDEMIA 02/28/2007  . HYPERTENSION 02/28/2007  . Morbid obesity 02/28/2007  . Arthritis   . SLEEP APNEA 02/28/2007    does not use a cpap-could not use  . Family history of anesthesia complication     sister hard to wake up    Past Surgical History  Procedure Laterality Date  . Electrocardiogram  11/19/2004  . Tubal ligation  1996  . Dilation and curettage of uterus  2009  . Cervical biopsy  w/ loop electrode excision    . Breast lumpectomy with needle localization Left 01/01/2013    Procedure: LEFT BREAST LUMPECTOMY WITH NEEDLE LOCALIZATION;  Surgeon: Marcello Moores A. Cornett, MD;  Location: [place];  Service: General;  Laterality: Left;    History   Social History  . Marital Status: Single    Spouse Name: N/A    Number of Children: N/A  . 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 Smoking status: Never Smoker   . Smokeless tobacco: Never Used  . Alcohol Use: No  . Drug Use: No  . Sexual Activity: Not on file   Other Topics Concern  . Not on file   Social History Narrative  . No narrative on file    Current Outpatient Prescriptions on File Prior to Visit  Medication Sig Dispense Refill  . aspirin 81 MG tablet Take 81 mg by mouth daily.      [name] atorvastatin (LIPITOR) 10 MG tablet TAKE 1 TABLET (10 MG TOTAL) BY MOUTH DAILY.  90 tablet  2  . diltiazem (TIAZAC) 300 MG 24 hr capsule TAKE ONE CAPSULE EVERY DAY  90 capsule  3  . insulin NPH-regular Human (NOVOLIN 70/30 RELION) (70-30) 100 UNIT/ML injection INJECT  110 UNITS SUBCUTANEOUSLY WITH BREAKFAST AND 30 UNITS WITH THE EVENING MEAL.  40 mL  2  . lisinopril (PRINIVIL,ZESTRIL) 40 MG tablet Take 1 tablet (40 mg total) by mouth daily.  30 tablet  4  . norethindrone (ERRIN) 0.35 MG tablet Take 1 tablet by mouth daily.        [name] oxyCODONE-acetaminophen (ROXICET) 5-325 MG per tablet Take 1 tablet by mouth every 4 (four) hours as needed for pain.  30 tablet  0  . atorvastatin (LIPITOR) 10 MG tablet Take 1 tablet (10 mg total) by mouth daily.  90 tablet  3   No current facility-administered medications on file prior to visit.    No Known Allergies  Family History  Problem Relation Age of Onset  . Diabetes Mother   . Arthritis Mother   . Hypertension Mother   . Diabetes Other     Sibling  . Diabetes Other     Sibling  . Arthritis Father   . Cancer Father     Prostate  . Dementia Father   . Alzheimer's disease Father   . Cancer Sister     Lymphoma  . Arthritis Sister   . Arthritis Brother   . Cancer Sister     Peritoneal  . Arthritis Sister   .  Arthritis Sister   . Arthritis Sister   . Arthritis Brother     BP 118/70  Pulse 96  Temp(Src) 98.3 F (36.8 C) (Oral)  Ht 5\' 3"  (1.6 m)  Wt 345 lb (156.491 kg)  BMI 61.13 kg/m2  SpO2 96%  Review of Systems  Constitutional: Negative for fever.  HENT: Negative for hearing loss.   Eyes: Positive for itching.  Respiratory: Negative for shortness of breath.   Cardiovascular: Negative for chest pain.  Gastrointestinal: Negative for anal bleeding.  Endocrine: Negative for cold intolerance.  Genitourinary: Negative for hematuria.  Musculoskeletal: Positive for back pain.  Skin: Negative for rash.  Allergic/Immunologic: Positive for environmental allergies.  Neurological: Negative for syncope and headaches.  Hematological: Does not bruise/bleed easily.  Psychiatric/Behavioral: Negative for dysphoric mood.       Objective:   Physical Exam VS: see vs page GEN: no distress.  Morbid  obesity HEAD: head: no deformity eyes: no periorbital swelling, no proptosis external nose and ears are normal mouth: no lesion seen NECK: supple, thyroid is not enlarged CHEST WALL: no deformity LUNGS:  Clear to auscultation BREASTS:  sees gyn CV: reg rate and rhythm, no murmur ABD: abdomen is soft, nontender.  no hepatosplenomegaly.  not distended.  no hernia GENITALIA/RECTAL: sees gyn.  MUSCULOSKELETAL: muscle bulk and strength are grossly normal.  no obvious joint swelling.  gait is normal and steady PULSES: no carotid bruit. NEURO:  cn 2-12 grossly intact.   readily moves all 4's.  SKIN:  Normal texture and temperature.  No rash or suspicious lesion is visible.   NODES:  None palpable at the neck.  PSYCH: alert, well-oriented.  Does not appear anxious nor depressed.       Assessment & Plan:  Wellness visit today, with problems stable, except as noted. we discussed code status.  pt requests full code, but would not want to be started or maintained on artificial life-support measures if there was not a reasonable chance of recovery.

## 2013-12-13 NOTE — Patient Instructions (Addendum)
please consider these measures for your health:  minimize alcohol.  do not use tobacco products.  have a colonoscopy at least every 10 years from age 57.  Women should have an annual mammogram from age 66.  keep firearms safely stored.  always use seat belts.  have working smoke alarms in your home.  see an eye doctor and dentist regularly.  never drive under the influence of alcohol or drugs (including prescription drugs).   check your blood sugar 2 times a day.  vary the time of day when you check, between before the 3 meals, and at bedtime.  also check if you have symptoms of your blood sugar being too high or too low.  please keep a record of the readings and bring it to your next appointment here.  please call us sooner if your blood sugar goes below 70, or if it stays over 200.   blood and urine tests are being requested for you today.  We'll contact you with results.  On this type of insulin schedule, you should eat meals on a regular schedule.  If a meal is missed or significantly delayed, your blood sugar could go low.  If you are going to be active, subtract 20 units from that injection.  If you are unable to anticipate activity, eat a light snack with it.   Let's check an "echo" (an easy and painless heart test).  you will receive a phone call, about a day and time for an appointment.   Please let me know if you decide to do the colonoscopy.  This reduces your changes of dying of cancer.  Please come back for a follow-up appointment in 3 months.

## 2013-12-14 LAB — HEPATIC FUNCTION PANEL (6)
ALT: 12 IU/L (ref 0–32)
AST: 15 IU/L (ref 0–40)
Albumin: 4 g/dL (ref 3.5–5.5)
Alkaline Phosphatase: 103 IU/L (ref 39–117)
Bilirubin, Direct: 0.11 mg/dL (ref 0.00–0.40)
Total Bilirubin: 0.3 mg/dL (ref 0.0–1.2)

## 2013-12-17 ENCOUNTER — Telehealth: Payer: Self-pay | Admitting: Endocrinology

## 2013-12-17 NOTE — Telephone Encounter (Signed)
Pt would like her A1C results and also discuss calcium levels?  Call back:[phone]  Thank You

## 2013-12-18 NOTE — Telephone Encounter (Signed)
Pt informed of labs

## 2013-12-26 ENCOUNTER — Other Ambulatory Visit: Payer: Self-pay

## 2013-12-26 MED ORDER — LISINOPRIL 40 MG PO TABS: 40.0000 mg | ORAL_TABLET | Freq: Every day | ORAL | Status: DC

## 2014-01-02 ENCOUNTER — Other Ambulatory Visit: Payer: Self-pay | Admitting: Obstetrics and Gynecology

## 2014-02-27 ENCOUNTER — Other Ambulatory Visit: Payer: Self-pay | Admitting: Endocrinology

## 2014-03-14 ENCOUNTER — Ambulatory Visit (INDEPENDENT_AMBULATORY_CARE_PROVIDER_SITE_OTHER): Payer: BC Managed Care – PPO | Admitting: Endocrinology

## 2014-03-14 ENCOUNTER — Encounter: Payer: Self-pay | Admitting: Endocrinology

## 2014-03-14 VITALS — BP 126/74 | HR 87 | Temp 98.7°F | Ht 63.0 in | Wt 342.0 lb

## 2014-03-14 DIAGNOSIS — E1049 Type 1 diabetes mellitus with other diabetic neurological complication: Secondary | ICD-10-CM

## 2014-03-14 DIAGNOSIS — Z23 Encounter for immunization: Secondary | ICD-10-CM

## 2014-03-14 LAB — HEMOGLOBIN A1C: Hgb A1c MFr Bld: 8.1 % — ABNORMAL HIGH (ref 4.6–6.5)

## 2014-03-14 NOTE — Patient Instructions (Signed)
check your blood sugar 2 times a day.  vary the time of day when you check, between before the 3 meals, and at bedtime.  also check if you have symptoms of your blood sugar being too high or too low.  please keep a record of the readings and bring it to your next appointment here.  please call us sooner if your blood sugar goes below 70, or if it stays over 200.   A diabetes blood test is requested for you today.  We'll contact you with results.  On this type of insulin schedule, you should eat meals on a regular schedule.  If a meal is missed or significantly delayed, your blood sugar could go low.  If you are going to be active, subtract 20 units from that injection.  If you are unable to anticipate activity, eat a light snack with it.   Please come back for a follow-up appointment in 3 months.

## 2014-03-14 NOTE — Progress Notes (Signed)
Subjective:    Patient ID: Stacey Wise, female    DOB: Oct 30, 1955, 58 y.o.   MRN: [id number]  HPI Pt returns for f/u of insulin-requiring DM (dx'ed 1996; she has moderate neuropathy of the lower extremities; no associated chronic complications; she has been on insulin since 2005; she is on bid premixed human insulin for cost and simplicity; she has never had severe hypoglycemia, pancreatitis, or DKA; she declines weight-loss surgery).  no cbg record, but states cbg's are seldom low, and these episodes are mild.  It happens after a missed meal.   Past Medical History  Diagnosis Date  . DIABETES MELLITUS, TYPE I 02/28/2007  . DIABETIC PERIPHERAL NEUROPATHY 02/28/2007  . DYSLIPIDEMIA 02/28/2007  . HYPERTENSION 02/28/2007  . Morbid obesity 02/28/2007  . Arthritis   . SLEEP APNEA 02/28/2007    does not use a cpap-could not use  . Family history of anesthesia complication     sister hard to wake up    Past Surgical History  Procedure Laterality Date  . Electrocardiogram  11/19/2004  . Tubal ligation  1996  . Dilation and curettage of uterus  2009  . Cervical biopsy  w/ loop electrode excision    . Breast lumpectomy with needle localization Left 01/01/2013    Procedure: LEFT BREAST LUMPECTOMY WITH NEEDLE LOCALIZATION;  Surgeon: Marcello Moores A. Cornett, MD;  Location: [place];  Service: General;  Laterality: Left;    History   Social History  . Marital Status: Single    Spouse Name: N/A    Number of Children: N/A  . 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 Smoking status: Never Smoker   . Smokeless tobacco: Never Used  . Alcohol Use: No  . Drug Use: No  . Sexual Activity: Not on file   Other Topics Concern  . Not on file   Social History Narrative  . No narrative on file    Current Outpatient Prescriptions on File Prior to Visit  Medication Sig Dispense Refill  . aspirin 81 MG  tablet Take 81 mg by mouth daily.      [name] atorvastatin (LIPITOR) 10 MG tablet TAKE 1 TABLET (10 MG TOTAL) BY MOUTH DAILY.  90 tablet  2  . diltiazem (TIAZAC) 300 MG 24 hr capsule TAKE ONE CAPSULE EVERY DAY  90 capsule  3  . glucose blood (ONE TOUCH ULTRA TEST) test strip 1 each by Other route 2 (two) times daily. And lancets 2/day 250.01  100 each  12  . lisinopril (PRINIVIL,ZESTRIL) 40 MG tablet Take 1 tablet (40 mg total) by mouth daily.  30 tablet  2  . norethindrone (ERRIN) 0.35 MG tablet Take 1 tablet by mouth daily.        [name] oxyCODONE-acetaminophen (ROXICET) 5-325 MG per tablet Take 1 tablet by mouth every 4 (four) hours as needed for pain.  30 tablet  0  . PRED FORTE 1 % ophthalmic suspension Place 3 drops into the right eye.       [name] atorvastatin (LIPITOR) 10 MG tablet Take 1 tablet (10 mg total) by mouth daily.  90 tablet  3   No current facility-administered medications on file prior to visit.    No Known Allergies  Family History  Problem Relation Age of Onset  . Diabetes Mother   . Arthritis Mother   . Hypertension Mother   . Diabetes Other  Sibling  . Diabetes Other     Sibling  . Arthritis Father   . Cancer Father     Prostate  . Dementia Father   . Alzheimer's disease Father   . Cancer Sister     Lymphoma  . Arthritis Sister   . Arthritis Brother   . Cancer Sister     Peritoneal  . Arthritis Sister   . Arthritis Sister   . Arthritis Sister   . Arthritis Brother     BP 126/74  Pulse 87  Temp(Src) 98.7 F (37.1 C) (Oral)  Ht 5\' 3"  (1.6 m)  Wt 342 lb (155.13 kg)  BMI 60.60 kg/m2  SpO2 94%  Review of Systems she denies LOC and n/v.      Objective:   Physical Exam VITAL SIGNS:  See vs page GENERAL: no distress. Morbid obesity SKIN:  Insulin injection sites at the anterior abdomen are normal  Lab Results  Component Value Date   HGBA1C 8.1* 03/14/2014      Assessment & Plan:  DM: mild exacerbation. Noncompliance with cbg recording: I'll work  around this as best I can.      Patient is advised the following: Patient Instructions  check your blood sugar 2 times a day.  vary the time of day when you check, between before the 3 meals, and at bedtime.  also check if you have symptoms of your blood sugar being too high or too low.  please keep a record of the readings and bring it to your next appointment here.  please call us sooner if your blood sugar goes below 70, or if it stays over 200.   A diabetes blood test is requested for you today.  We'll contact you with results.  On this type of insulin schedule, you should eat meals on a regular schedule.  If a meal is missed or significantly delayed, your blood sugar could go low.  If you are going to be active, subtract 20 units from that injection.  If you are unable to anticipate activity, eat a light snack with it.   Please come back for a follow-up appointment in 3 months.

## 2014-03-24 ENCOUNTER — Other Ambulatory Visit: Payer: Self-pay

## 2014-03-24 MED ORDER — LISINOPRIL 40 MG PO TABS: 40.0000 mg | ORAL_TABLET | Freq: Every day | ORAL | Status: DC

## 2014-04-21 ENCOUNTER — Other Ambulatory Visit: Payer: Self-pay | Admitting: Endocrinology

## 2014-04-21 DIAGNOSIS — E109 Type 1 diabetes mellitus without complications: Secondary | ICD-10-CM

## 2014-06-12 ENCOUNTER — Other Ambulatory Visit: Payer: Self-pay | Admitting: Endocrinology

## 2014-06-13 ENCOUNTER — Ambulatory Visit: Payer: BC Managed Care – PPO | Admitting: Endocrinology

## 2014-06-17 ENCOUNTER — Ambulatory Visit (INDEPENDENT_AMBULATORY_CARE_PROVIDER_SITE_OTHER): Payer: BC Managed Care – PPO | Admitting: Endocrinology

## 2014-06-17 VITALS — BP 136/84 | HR 94 | Temp 98.6°F

## 2014-06-17 DIAGNOSIS — E1142 Type 2 diabetes mellitus with diabetic polyneuropathy: Secondary | ICD-10-CM

## 2014-06-17 DIAGNOSIS — E109 Type 1 diabetes mellitus without complications: Secondary | ICD-10-CM

## 2014-06-17 DIAGNOSIS — E1042 Type 1 diabetes mellitus with diabetic polyneuropathy: Secondary | ICD-10-CM

## 2014-06-17 LAB — HEMOGLOBIN A1C: Hgb A1c MFr Bld: 8.2 % — ABNORMAL HIGH (ref 4.6–6.5)

## 2014-06-17 NOTE — Progress Notes (Signed)
Subjective:    Patient ID: Stacey Wise, female    DOB: 03-Aug-1956, 58 y.o.   MRN: [id number]  HPI Pt returns for f/u of diabetes mellitus: DM type: Insulin-requiring type 2 Dx'ed: 8676 Complications: polyneuropathy Therapy: insulin since 2005 GDM: never DKA: never Severe hypoglycemia: never Pancreatitis: never Other: she is on bid premixed human insulin for cost and simplicity; she declines weight-loss surgery Interval history:  pt states she feels well in general.  no cbg record, but states cbg's were frequently low in the afternoon, on work days (she says she works 7 days/week).  Past Medical History  Diagnosis Date  . DIABETES MELLITUS, TYPE I 02/28/2007  . DIABETIC PERIPHERAL NEUROPATHY 02/28/2007  . DYSLIPIDEMIA 02/28/2007  . HYPERTENSION 02/28/2007  . Morbid obesity 02/28/2007  . Arthritis   . SLEEP APNEA 02/28/2007    does not use a cpap-could not use  . Family history of anesthesia complication     sister hard to wake up    Past Surgical History  Procedure Laterality Date  . Electrocardiogram  11/19/2004  . Tubal ligation  1996  . Dilation and curettage of uterus  2009  . Cervical biopsy  w/ loop electrode excision    . Breast lumpectomy with needle localization Left 01/01/2013    Procedure: LEFT BREAST LUMPECTOMY WITH NEEDLE LOCALIZATION;  Surgeon: Marcello Moores A. Cornett, MD;  Location: [place];  Service: General;  Laterality: Left;    History   Social History  . Marital Status: Single    Spouse Name: N/A    Number of Children: N/A  . 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 Smoking status: Never Smoker   . Smokeless tobacco: Never Used  . Alcohol Use: No  . Drug Use: No  . Sexual Activity: Not on file   Other Topics Concern  . Not on file   Social History Narrative  . No narrative on file    Current Outpatient Prescriptions on File Prior to Visit    Medication Sig Dispense Refill  . aspirin 81 MG tablet Take 81 mg by mouth daily.      [name] atorvastatin (LIPITOR) 10 MG tablet TAKE 1 TABLET (10 MG TOTAL) BY MOUTH DAILY.  90 tablet  2  . diltiazem (TIAZAC) 300 MG 24 hr capsule TAKE ONE CAPSULE EVERY DAY  90 capsule  3  . glucose blood (ONE TOUCH ULTRA TEST) test strip 1 each by Other route 2 (two) times daily. And lancets 2/day 250.01  100 each  12  . insulin NPH-regular Human (NOVOLIN 70/30) (70-30) 100 UNIT/ML injection INJECT 110 UNITS UNDER THE SKIN WITH BREAKFAST AND 60 UNITS WITH THE EVENING MEAL.      [name] lisinopril (PRINIVIL,ZESTRIL) 40 MG tablet Take 1 tablet (40 mg total) by mouth daily.  30 tablet  2  . norethindrone (ERRIN) 0.35 MG tablet Take 1 tablet by mouth daily.        [name] NOVOLIN 70/30 (70-30) 100 UNIT/ML injection INJECT 110 UNITS UNDER THE SKIN WITH BREAKFAST AND 30 UNITS WITH THE EVENING MEAL.  40 mL  2  . oxyCODONE-acetaminophen (ROXICET) 5-325 MG per tablet Take 1 tablet by mouth every 4 (four) hours as needed for pain.  30 tablet  0  . atorvastatin (LIPITOR) 10 MG tablet Take 1 tablet (10 mg total) by mouth daily.  90 tablet  3   No current facility-administered medications  on file prior to visit.    No Known Allergies  Family History  Problem Relation Age of Onset  . Diabetes Mother   . Arthritis Mother   . Hypertension Mother   . Diabetes Other     Sibling  . Diabetes Other     Sibling  . Arthritis Father   . Cancer Father     Prostate  . Dementia Father   . Alzheimer's disease Father   . Cancer Sister     Lymphoma  . Arthritis Sister   . Arthritis Brother   . Cancer Sister     Peritoneal  . Arthritis Sister   . Arthritis Sister   . Arthritis Sister   . Arthritis Brother     BP 136/84  Pulse 94  Temp(Src) 98.6 F (37 C) (Oral)  SpO2 96%   Review of Systems Denies LOC and weight change    Objective:   Physical Exam VITAL SIGNS:  See vs page GENERAL: no distress.  Morbid obesity Pulses:  dorsalis pedis intact bilat.  Feet: no deformity. normal color and temp. Trace bilat leg edema. There is bilateral onychomycosis and dry skin  Skin: no ulcer on the feet.  There is patchy hyperpigmentation on the feet.   Neuro: sensation is intact to touch on the feet    Lab Results  Component Value Date   HGBA1C 8.2* 06/17/2014       Assessment & Plan:  DM: moderate exacerbation: the pattern of his cbg's indicates he needs some adjustment in his therapy. Side-effect of rx: mild hypoglycemia Morbid obesity: persistent   Patient is advised the following: Patient Instructions  check your blood sugar 2 times a day.  vary the time of day when you check, between before the 3 meals, and at bedtime.  also check if you have symptoms of your blood sugar being too high or too low.  please keep a record of the readings and bring it to your next appointment here.  please call us sooner if your blood sugar goes below 70, or if it stays over 200.   A diabetes blood test is requested for you today.  We'll contact you with results.  On this type of insulin schedule, you should eat meals on a regular schedule.  If a meal is missed or significantly delayed, your blood sugar could go low.  If you are going to be active, subtract 20 units from that injection.  If you are unable to anticipate activity, eat a light snack with it.   Please change the insulin to 110 units in the morning, and 50 in the evening.  Please come back for a follow-up appointment in 3 months.    Please let me know if you reconsider the weight-loss surgery.

## 2014-06-17 NOTE — Patient Instructions (Addendum)
check your blood sugar 2 times a day.  vary the time of day when you check, between before the 3 meals, and at bedtime.  also check if you have symptoms of your blood sugar being too high or too low.  please keep a record of the readings and bring it to your next appointment here.  please call us sooner if your blood sugar goes below 70, or if it stays over 200.   A diabetes blood test is requested for you today.  We'll contact you with results.  On this type of insulin schedule, you should eat meals on a regular schedule.  If a meal is missed or significantly delayed, your blood sugar could go low.  If you are going to be active, subtract 20 units from that injection.  If you are unable to anticipate activity, eat a light snack with it.   Please change the insulin to 110 units in the morning, and 50 in the evening.  Please come back for a follow-up appointment in 3 months.    Please let me know if you reconsider the weight-loss surgery.

## 2014-06-18 ENCOUNTER — Telehealth: Payer: Self-pay

## 2014-06-18 NOTE — Telephone Encounter (Signed)
Error

## 2014-06-26 ENCOUNTER — Telehealth: Payer: Self-pay | Admitting: Endocrinology

## 2014-06-26 NOTE — Telephone Encounter (Signed)
Pt coming for ov tomorrow at 11:15 am.

## 2014-06-26 NOTE — Telephone Encounter (Signed)
Patient B/P is really high 202/86 she would like to know should her b/p pressure medicine be changed or should keep it the same.  Please Advise

## 2014-06-26 NOTE — Telephone Encounter (Signed)
Ov tomorrow 

## 2014-06-26 NOTE — Telephone Encounter (Signed)
See below and please advise, Thanks!  

## 2014-06-27 ENCOUNTER — Ambulatory Visit (INDEPENDENT_AMBULATORY_CARE_PROVIDER_SITE_OTHER): Payer: BC Managed Care – PPO | Admitting: Endocrinology

## 2014-06-27 ENCOUNTER — Encounter: Payer: Self-pay | Admitting: Endocrinology

## 2014-06-27 VITALS — BP 160/88 | HR 84 | Temp 97.9°F | Ht 63.0 in | Wt 342.0 lb

## 2014-06-27 DIAGNOSIS — E1042 Type 1 diabetes mellitus with diabetic polyneuropathy: Secondary | ICD-10-CM

## 2014-06-27 LAB — HEMOGLOBIN A1C: Hgb A1c MFr Bld: 8.1 % — ABNORMAL HIGH (ref 4.6–6.5)

## 2014-06-27 MED ORDER — FUROSEMIDE 20 MG PO TABS: 20.0000 mg | ORAL_TABLET | Freq: Every day | ORAL | Status: DC

## 2014-06-27 MED ORDER — ISOSORB DINITRATE-HYDRALAZINE 20-37.5 MG PO TABS: 1.0000 | ORAL_TABLET | Freq: Three times a day (TID) | ORAL | Status: DC

## 2014-06-27 NOTE — Patient Instructions (Addendum)
Please continue the same insulin Loratadine-d (non-prescription) will help your congestion.   i have sent 2 prescriptions to your pharmacy: to add a different type of fluid pill, and to add "bidil." Please come back for a follow-up appointment in 1-2 weeks.

## 2014-06-27 NOTE — Progress Notes (Signed)
Subjective:    Patient ID: Stacey Wise, female    DOB: [birth year], 57 y.o.   MRN: [id number]  HPI Pt returns for f/u of HTN.  She has 2 days of slight headache at the bioccipital area, but no assoc visual loss.  She was on HCTZ in the past, but it was stopped, due to hypercalcemia.   no cbg record, but states cbg's are well-controlled.   Past Medical History  Diagnosis Date  . DIABETES MELLITUS, TYPE I 02/28/2007  . DIABETIC PERIPHERAL NEUROPATHY 02/28/2007  . DYSLIPIDEMIA 02/28/2007  . HYPERTENSION 02/28/2007  . Morbid obesity 02/28/2007  . Arthritis   . SLEEP APNEA 02/28/2007    does not use a cpap-could not use  . Family history of anesthesia complication     sister hard to wake up    Past Surgical History  Procedure Laterality Date  . Electrocardiogram  11/19/2004  . Tubal ligation  1996  . Dilation and curettage of uterus  2009  . Cervical biopsy  w/ loop electrode excision    . Breast lumpectomy with needle localization Left 01/01/2013    Procedure: LEFT BREAST LUMPECTOMY WITH NEEDLE LOCALIZATION;  Surgeon: Marcello Moores A. Cornett, MD;  Location: [place];  Service: General;  Laterality: Left;    History   Social History  . Marital Status: Single    Spouse Name: N/A    Number of Children: N/A  . 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 Smoking status: Never Smoker   . Smokeless tobacco: Never Used  . Alcohol Use: No  . Drug Use: No  . Sexual Activity: Not on file   Other Topics Concern  . Not on file   Social History Narrative  . No narrative on file    Current Outpatient Prescriptions on File Prior to Visit  Medication Sig Dispense Refill  . aspirin 81 MG tablet Take 81 mg by mouth daily.      [name] atorvastatin (LIPITOR) 10 MG tablet TAKE 1 TABLET (10 MG TOTAL) BY MOUTH DAILY.  90 tablet  2  . diltiazem (TIAZAC) 300 MG 24 hr capsule TAKE ONE CAPSULE EVERY DAY  90  capsule  3  . glucose blood (ONE TOUCH ULTRA TEST) test strip 1 each by Other route 2 (two) times daily. And lancets 2/day 250.01  100 each  12  . insulin NPH-regular Human (NOVOLIN 70/30) (70-30) 100 UNIT/ML injection INJECT 110 UNITS UNDER THE SKIN WITH BREAKFAST AND 60 UNITS WITH THE EVENING MEAL.      [name] lisinopril (PRINIVIL,ZESTRIL) 40 MG tablet Take 1 tablet (40 mg total) by mouth daily.  30 tablet  2  . norethindrone (ERRIN) 0.35 MG tablet Take 1 tablet by mouth daily.        [name] NOVOLIN 70/30 (70-30) 100 UNIT/ML injection INJECT 110 UNITS UNDER THE SKIN WITH BREAKFAST AND 30 UNITS WITH THE EVENING MEAL.  40 mL  2  . oxyCODONE-acetaminophen (ROXICET) 5-325 MG per tablet Take 1 tablet by mouth every 4 (four) hours as needed for pain.  30 tablet  0   No current facility-administered medications on file prior to visit.    No Known Allergies  Family History  Problem Relation Age of Onset  . Diabetes Mother   . Arthritis Mother   . Hypertension Mother   . Diabetes Other     Sibling  . Diabetes Other  Sibling  . Arthritis Father   . Cancer Father     Prostate  . Dementia Father   . Alzheimer's disease Father   . Cancer Sister     Lymphoma  . Arthritis Sister   . Arthritis Brother   . Cancer Sister     Peritoneal  . Arthritis Sister   . Arthritis Sister   . Arthritis Sister   . Arthritis Brother     BP 160/88  Pulse 84  Temp(Src) 97.9 F (36.6 C) (Oral)  Ht 5\' 3"  (1.6 m)  Wt 342 lb (155.13 kg)  BMI 60.60 kg/m2  SpO2 94%     Review of Systems No change in chronic edema.  She has chronic nasal congestion.      Objective:   Physical Exam VITAL SIGNS:  See vs page.   GENERAL: no distress.  Morbid obesity.   head: no deformity eyes: no periorbital swelling, no proptosis external nose and ears are normal.  mouth: no lesion seen.   Both eac's and tm's are normal.  Ext: 1+ bilat leg edema.         Assessment & Plan:  Allergic rhinitis, new HTN: mod  exacerbation DM: much better Headache, prob due to nasal congestion.  Patient is advised the following: Patient Instructions  Please continue the same insulin Loratadine-d (non-prescription) will help your congestion.   i have sent 2 prescriptions to your pharmacy: to add a different type of fluid pill, and to add "bidil." Please come back for a follow-up appointment in 1-2 weeks.

## 2014-06-30 ENCOUNTER — Telehealth: Payer: Self-pay | Admitting: Endocrinology

## 2014-06-30 NOTE — Telephone Encounter (Signed)
Yes, it is ok. It was sent to pharmacy last week. Ok to send to pharmacy of pt's choice

## 2014-06-30 NOTE — Telephone Encounter (Addendum)
Patient was informed by the pharmacist if its ok to continue taking the lasix with her blood pressure medication  Also, she would like to know when her new medication will be sent to her pharmacy    Please advise    Thank You

## 2014-06-30 NOTE — Telephone Encounter (Signed)
See below and please advise, Thanks!  

## 2014-07-01 ENCOUNTER — Telehealth: Payer: Self-pay

## 2014-07-01 NOTE — Telephone Encounter (Signed)
Pt called stating that since she began taking her Furosemide she has been experiencing some numbness and tingling in her fingers. Should the pt be concerned about this? Please advise, Thanks!

## 2014-07-01 NOTE — Telephone Encounter (Signed)
Pt advised of note below. She states she is waiting on [facility] to dispense the Bidil. Pt to call back and let us know if she would like for the med to be sent to a different pharmacy.

## 2014-07-02 NOTE — Telephone Encounter (Signed)
Unlikely to be anything to worry about.  However, please move up next appt to next available.

## 2014-07-02 NOTE — Telephone Encounter (Signed)
Pt coming in for ov on 11/5 at 1030 to discuss. Pt also stated that she has not been able to pick the Bidil up as of today. [facility] and [facility] are stating they do not know when this medication would be available. Pt would like to address this at ov visit as well.  Thanks!

## 2014-07-03 ENCOUNTER — Ambulatory Visit (INDEPENDENT_AMBULATORY_CARE_PROVIDER_SITE_OTHER): Payer: BC Managed Care – PPO | Admitting: Endocrinology

## 2014-07-03 ENCOUNTER — Encounter: Payer: Self-pay | Admitting: Endocrinology

## 2014-07-03 VITALS — BP 158/92 | HR 94 | Temp 97.8°F | Ht 63.0 in | Wt 342.0 lb

## 2014-07-03 DIAGNOSIS — I10 Essential (primary) hypertension: Secondary | ICD-10-CM

## 2014-07-03 MED ORDER — TORSEMIDE 10 MG PO TABS: 10.0000 mg | ORAL_TABLET | Freq: Every day | ORAL | Status: DC

## 2014-07-03 NOTE — Progress Notes (Signed)
Subjective:    Patient ID: Stacey Wise, female    DOB: 1955/11/13, 58 y.o.   MRN: [id number]  HPI Pt returns for f/u of HTN.  She has 2 days of slight headache at the bioccipital area, but no assoc visual loss.  She was on HCTZ in the past, but it was stopped, due to hypercalcemia.  Pharmacy could not obtain bidil.   She has a few years of moderate swelling of the legs, but no assoc sob.  Past Medical History  Diagnosis Date  . DIABETES MELLITUS, TYPE I 02/28/2007  . DIABETIC PERIPHERAL NEUROPATHY 02/28/2007  . DYSLIPIDEMIA 02/28/2007  . HYPERTENSION 02/28/2007  . Morbid obesity 02/28/2007  . Arthritis   . SLEEP APNEA 02/28/2007    does not use a cpap-could not use  . Family history of anesthesia complication     sister hard to wake up    Past Surgical History  Procedure Laterality Date  . Electrocardiogram  11/19/2004  . Tubal ligation  1996  . Dilation and curettage of uterus  2009  . Cervical biopsy  w/ loop electrode excision    . Breast lumpectomy with needle localization Left 01/01/2013    Procedure: LEFT BREAST LUMPECTOMY WITH NEEDLE LOCALIZATION;  Surgeon: Marcello Moores A. Cornett, MD;  Location: [place];  Service: General;  Laterality: Left;    History   Social History  . Marital Status: Single    Spouse Name: N/A    Number of Children: N/A  . 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 Smoking status: Never Smoker   . Smokeless tobacco: Never Used  . Alcohol Use: No  . Drug Use: No  . Sexual Activity: Not on file   Other Topics Concern  . Not on file   Social History Narrative    Current Outpatient Prescriptions on File Prior to Visit  Medication Sig Dispense Refill  . aspirin 81 MG tablet Take 81 mg by mouth daily.    [name] atorvastatin (LIPITOR) 10 MG tablet TAKE 1 TABLET (10 MG TOTAL) BY MOUTH DAILY. 90 tablet 2  . diltiazem (TIAZAC) 300 MG 24 hr capsule TAKE ONE  CAPSULE EVERY DAY 90 capsule 3  . glucose blood (ONE TOUCH ULTRA TEST) test strip 1 each by Other route 2 (two) times daily. And lancets 2/day 250.01 100 each 12  . insulin NPH-regular Human (NOVOLIN 70/30) (70-30) 100 UNIT/ML injection INJECT 110 UNITS UNDER THE SKIN WITH BREAKFAST AND 60 UNITS WITH THE EVENING MEAL.    [name] lisinopril (PRINIVIL,ZESTRIL) 40 MG tablet Take 1 tablet (40 mg total) by mouth daily. 30 tablet 2  . norethindrone (ERRIN) 0.35 MG tablet Take 1 tablet by mouth daily.      [name] NOVOLIN 70/30 (70-30) 100 UNIT/ML injection INJECT 110 UNITS UNDER THE SKIN WITH BREAKFAST AND 30 UNITS WITH THE EVENING MEAL. 40 mL 2  . oxyCODONE-acetaminophen (ROXICET) 5-325 MG per tablet Take 1 tablet by mouth every 4 (four) hours as needed for pain. 30 tablet 0   No current facility-administered medications on file prior to visit.    No Known Allergies  Family History  Problem Relation Age of Onset  . Diabetes Mother   . Arthritis Mother   . Hypertension Mother   . Diabetes Other     Sibling  . Diabetes Other     Sibling  . Arthritis Father   . Cancer Father  Prostate  . Dementia Father   . Alzheimer's disease Father   . Cancer Sister     Lymphoma  . Arthritis Sister   . Arthritis Brother   . Cancer Sister     Peritoneal  . Arthritis Sister   . Arthritis Sister   . Arthritis Sister   . Arthritis Brother     BP 158/92 mmHg  Pulse 94  Temp(Src) 97.8 F (36.6 C) (Oral)  Ht 5\' 3"  (1.6 m)  Wt 342 lb (155.13 kg)  BMI 60.60 kg/m2  SpO2 94%  Review of Systems She stopped lasix due to n/v and dizziness.     Objective:   Physical Exam VITAL SIGNS:  See vs page GENERAL: no distress.  Morbid obesity Ext: 1+ bilat leg edema.       Assessment & Plan:  HTN: mild exacerbation Edema: in this setting, she should have diuretic rx N/v/dizziness, perceived due to lasix. As she also did not tolerate HCTZ, we should try another diuretic.   Patient is advised the  following: Patient Instructions  i have sent a prescription to your pharmacy, to add a different type of fluid pill.   Please come back for a follow-up appointment in 2 weeks.

## 2014-07-03 NOTE — Patient Instructions (Addendum)
i have sent a prescription to your pharmacy, to add a different type of fluid pill.   Please come back for a follow-up appointment in 2 weeks.

## 2014-07-08 ENCOUNTER — Other Ambulatory Visit: Payer: Self-pay | Admitting: Endocrinology

## 2014-07-11 ENCOUNTER — Ambulatory Visit: Payer: BC Managed Care – PPO | Admitting: Endocrinology

## 2014-07-13 ENCOUNTER — Other Ambulatory Visit: Payer: Self-pay | Admitting: Endocrinology

## 2014-07-22 ENCOUNTER — Other Ambulatory Visit: Payer: Self-pay | Admitting: Endocrinology

## 2014-08-15 ENCOUNTER — Other Ambulatory Visit: Payer: Self-pay | Admitting: Endocrinology

## 2014-09-10 ENCOUNTER — Other Ambulatory Visit: Payer: Self-pay | Admitting: Endocrinology

## 2014-09-17 ENCOUNTER — Ambulatory Visit: Payer: BC Managed Care – PPO | Admitting: Endocrinology

## 2014-10-24 ENCOUNTER — Other Ambulatory Visit: Payer: Self-pay | Admitting: Endocrinology

## 2014-12-27 ENCOUNTER — Other Ambulatory Visit: Payer: Self-pay | Admitting: Endocrinology

## 2014-12-29 ENCOUNTER — Other Ambulatory Visit: Payer: Self-pay

## 2015-01-22 ENCOUNTER — Other Ambulatory Visit: Payer: Self-pay | Admitting: Endocrinology

## 2015-01-23 NOTE — Telephone Encounter (Signed)
Rx refilled and appointment letter mailed to the patient.

## 2015-01-23 NOTE — Telephone Encounter (Signed)
Please refill x 1 Ov is due  

## 2015-01-23 NOTE — Telephone Encounter (Signed)
Please advise if ok to refill. Patient was last seen on 07/03/2014.

## 2015-02-02 ENCOUNTER — Other Ambulatory Visit (HOSPITAL_COMMUNITY): Payer: Self-pay | Admitting: Family Medicine

## 2015-02-02 DIAGNOSIS — R7989 Other specified abnormal findings of blood chemistry: Secondary | ICD-10-CM

## 2015-02-10 ENCOUNTER — Encounter (HOSPITAL_COMMUNITY): Payer: BLUE CROSS/BLUE SHIELD

## 2015-02-18 ENCOUNTER — Other Ambulatory Visit: Payer: Self-pay | Admitting: Endocrinology

## 2015-02-20 ENCOUNTER — Other Ambulatory Visit: Payer: Self-pay | Admitting: Obstetrics and Gynecology

## 2015-02-23 LAB — CYTOLOGY - PAP

## 2015-03-11 ENCOUNTER — Other Ambulatory Visit (HOSPITAL_COMMUNITY): Payer: Self-pay | Admitting: Internal Medicine

## 2015-03-11 DIAGNOSIS — E210 Primary hyperparathyroidism: Secondary | ICD-10-CM

## 2015-03-17 ENCOUNTER — Other Ambulatory Visit: Payer: Self-pay | Admitting: Endocrinology

## 2015-03-20 ENCOUNTER — Ambulatory Visit (HOSPITAL_COMMUNITY): Payer: BLUE CROSS/BLUE SHIELD

## 2015-03-20 ENCOUNTER — Ambulatory Visit (HOSPITAL_COMMUNITY)
Admission: RE | Admit: 2015-03-20 | Discharge: 2015-03-20 | Disposition: A | Discharge status: Home / Self Care | Payer: BLUE CROSS/BLUE SHIELD | Source: Ambulatory Visit | Attending: Internal Medicine | Admitting: Internal Medicine

## 2015-03-20 DIAGNOSIS — E210 Primary hyperparathyroidism: Secondary | ICD-10-CM | POA: Diagnosis present

## 2015-03-20 DIAGNOSIS — D351 Benign neoplasm of parathyroid gland: Secondary | ICD-10-CM | POA: Diagnosis not present

## 2015-03-20 DIAGNOSIS — E211 Secondary hyperparathyroidism, not elsewhere classified: Secondary | ICD-10-CM | POA: Insufficient documentation

## 2015-03-20 MED ORDER — TECHNETIUM TC 99M SESTAMIBI - CARDIOLITE
22.5000 | Freq: Once | INTRAVENOUS | Status: AC | PRN
  Administered 2015-03-20: 23 via INTRAVENOUS

## 2015-04-04 ENCOUNTER — Other Ambulatory Visit: Payer: Self-pay | Admitting: Endocrinology

## 2015-04-07 ENCOUNTER — Other Ambulatory Visit: Payer: Self-pay | Admitting: Endocrinology

## 2015-04-09 ENCOUNTER — Ambulatory Visit: Payer: BLUE CROSS/BLUE SHIELD | Admitting: *Deleted

## 2015-04-27 ENCOUNTER — Telehealth: Payer: Self-pay

## 2015-04-27 NOTE — Telephone Encounter (Signed)
Pt has changed MD to [facility]

## 2015-05-27 ENCOUNTER — Ambulatory Visit: Payer: Self-pay | Admitting: Surgery

## 2015-06-11 ENCOUNTER — Other Ambulatory Visit: Payer: Self-pay | Admitting: Endocrinology

## 2015-06-23 NOTE — Patient Instructions (Addendum)
[place]  06/23/2015   Your procedure is scheduled on:   06-30-2015 Tuesday  Enter through [facility]  Entrance and follow signs to [facility]. Arrive at   0800     AM .  (Limit 1 person with you).  Call this number if you have problems the morning of surgery: [phone]  Or Presurgical Testing [phone].   For Living Will and/or Health Care Power Attorney Forms: please provide copy for your medical record,may bring AM of surgery(Forms should be already notarized -we do not provide this service).( No information preferred today).      Do not eat food/ or drink: After Midnight.     Take these medicines the morning of surgery with A SIP OF WATER-   (DO NOT TAKE ANY DIABETIC MEDS AM OF SURGERY) : Diltiazem. Insulin(1/2 usual PM dose) night before. _none AM of.    Do not wear jewelry, make-up or nail polish.  Do not wear deodorant, lotions, powders, or perfumes.   Do not shave legs and under arms- 48 hours(2 days) prior to first CHG shower.(Shaving face and neck okay.)  Do not bring valuables to the hospital.(Hospital is not responsible for lost valuables).  Contacts, dentures or removable bridgework, body piercing, hair pins may not be worn into surgery.  Leave suitcase in the car. After surgery it may be brought to your room.  For patients admitted to the hospital, checkout time is 11:00 AM the day of discharge.(Restricted visitors-Any Persons displaying flu-like symptoms or illness).    Patients discharged the day of surgery will not be allowed to drive home. Must have responsible person with you x 24 hours once discharged.  Name and phone number of your driver: Lester [place] -[phone] cell     Please read over the following fact sheets that you were given:  CHG(Chlorhexidine Gluconate 4% Surgical Soap) use.         [facility] - Preparing for Surgery Before surgery, you can play an important role.  Because skin is not sterile, your skin  needs to be as free of germs as possible.  You can reduce the number of germs on your skin by washing with CHG (chlorahexidine gluconate) soap before surgery.  CHG is an antiseptic cleaner which kills germs and bonds with the skin to continue killing germs even after washing. Please DO NOT use if you have an allergy to CHG or antibacterial soaps.  If your skin becomes reddened/irritated stop using the CHG and inform your nurse when you arrive at Short Stay. Do not shave (including legs and underarms) for at least 48 hours prior to the first CHG shower.  You may shave your face/neck. Please follow these instructions carefully:  1.  Shower with CHG Soap the night before surgery and the  morning of Surgery.  2.  If you choose to wash your hair, wash your hair first as usual with your  normal  shampoo.  3.  After you shampoo, rinse your hair and body thoroughly to remove the  shampoo.                           4.  Use CHG as you would any other liquid soap.  You can apply chg directly  to the skin and wash                       Gently with a scrungie or clean  washcloth.  5.  Apply the CHG Soap to your body ONLY FROM THE NECK DOWN.   Do not use on face/ open                           Wound or open sores. Avoid contact with eyes, ears mouth and genitals (private parts).                       Wash face,  Genitals (private parts) with your normal soap.             6.  Wash thoroughly, paying special attention to the area where your surgery  will be performed.  7.  Thoroughly rinse your body with warm water from the neck down.  8.  DO NOT shower/wash with your normal soap after using and rinsing off  the CHG Soap.                9.  Pat yourself dry with a clean towel.            10.  Wear clean pajamas.            11.  Place clean sheets on your bed the night of your first shower and do not  sleep with pets. Day of Surgery : Do not apply any lotions/deodorants the morning of surgery.  Please wear clean  clothes to the hospital/surgery center.  FAILURE TO FOLLOW THESE INSTRUCTIONS MAY RESULT IN THE CANCELLATION OF YOUR SURGERY PATIENT SIGNATURE_________________________________  NURSE SIGNATURE__________________________________  ________________________________________________________________________

## 2015-06-24 ENCOUNTER — Other Ambulatory Visit (HOSPITAL_COMMUNITY): Payer: BLUE CROSS/BLUE SHIELD

## 2015-06-24 ENCOUNTER — Encounter (HOSPITAL_COMMUNITY): Payer: Self-pay

## 2015-06-24 ENCOUNTER — Encounter (HOSPITAL_COMMUNITY)
Admission: RE | Admit: 2015-06-24 | Discharge: 2015-06-24 | Disposition: A | Discharge status: Home / Self Care | Payer: BLUE CROSS/BLUE SHIELD | Source: Ambulatory Visit | Attending: Surgery | Admitting: Surgery

## 2015-06-24 DIAGNOSIS — E210 Primary hyperparathyroidism: Secondary | ICD-10-CM | POA: Diagnosis not present

## 2015-06-24 DIAGNOSIS — Z01818 Encounter for other preprocedural examination: Secondary | ICD-10-CM | POA: Diagnosis not present

## 2015-06-24 LAB — CBC
HCT: 41.6 % (ref 36.0–46.0)
Hemoglobin: 13 g/dL (ref 12.0–15.0)
MCH: 27.2 pg (ref 26.0–34.0)
MCHC: 31.3 g/dL (ref 30.0–36.0)
MCV: 87 fL (ref 78.0–100.0)
Platelets: 293 10*3/uL (ref 150–400)
RBC: 4.78 MIL/uL (ref 3.87–5.11)
RDW: 14.5 % (ref 11.5–15.5)
WBC: 12.6 10*3/uL — ABNORMAL HIGH (ref 4.0–10.5)

## 2015-06-24 LAB — BASIC METABOLIC PANEL
Anion gap: 5 (ref 5–15)
BUN: 22 mg/dL — ABNORMAL HIGH (ref 6–20)
CO2: 28 mmol/L (ref 22–32)
Calcium: 11 mg/dL — ABNORMAL HIGH (ref 8.9–10.3)
Chloride: 109 mmol/L (ref 101–111)
Creatinine, Ser: 0.94 mg/dL (ref 0.44–1.00)
GFR calc Af Amer: >60 mL/min (ref >60)
GFR calc non Af Amer: >60 mL/min (ref >60)
Glucose, Bld: 174 mg/dL — ABNORMAL HIGH (ref 65–99)
Potassium: 4.7 mmol/L (ref 3.5–5.1)
Sodium: 142 mmol/L (ref 135–145)

## 2015-06-24 NOTE — Pre-Procedure Instructions (Addendum)
EKG 3'16 with chart.

## 2015-06-29 ENCOUNTER — Encounter (HOSPITAL_COMMUNITY): Payer: Self-pay | Admitting: Surgery

## 2015-06-29 DIAGNOSIS — E210 Primary hyperparathyroidism: Secondary | ICD-10-CM | POA: Diagnosis present

## 2015-06-29 NOTE — H&P (Signed)
General Surgery [facility]  Stacey Wise DOB: March 03, 1956 Single / Language: Cleophus Molt / Race: Black or African American Female   History of Present Illness  The patient is a 59 year old female who presents with a parathyroid neoplasm. Patient referred by Dr. Delrae Rend for surgical management of primary hyperparathyroidism. Patient's primary care physician is Dr. Yaakov Guthrie. Patient presents with signs and symptoms of primary hyperparathyroidism. She was initially diagnosed because of hypercalcemia. Recent calcium level was elevated at 11.3. Intact PTH level is elevated at 101. 24-hour urine collection for calcium was elevated at 282. Patient underwent nuclear medicine parathyroid scan which localized the parathyroid adenoma to the left inferior position. Patient has had no prior surgery on the head or neck. There is no family history of parathyroid disease. There is no family history of other endocrine neoplasms. Patient notes chronic fatigue. She has had a bone density scan showing osteopenia. She denies nephrolithiasis.   Other Problems Arthritis Depression High blood pressure Hypercholesterolemia  Past Surgical History Breast Biopsy Left.  Diagnostic Studies History Colonoscopy never Mammogram within last year Pap Smear 1-5 years ago  Allergies No Known Drug Allergies09/28/2016  Medication History Atorvastatin Calcium (10MG  Tablet, Oral) Active. Diltiazem HCl ER Beads (300MG  Capsule ER 24HR, Oral) Active. HydrALAZINE HCl (25MG  Tablet, Oral) Active. Lisinopril (40MG  Tablet, Oral) Active. NovoLIN 70/30 ((70-30) 100UNIT/ML Suspension, Subcutaneous) Active. MetFORMIN HCl ER (500MG  Tablet ER 24HR, Oral) Active. Torsemide (10MG  Tablet, Oral) Active.  Social History No alcohol use No drug use Tobacco use Never smoker.  Family History  Arthritis Brother, Father, Sister. Diabetes Mellitus Brother,  Mother. Hypertension Father, Mother. Prostate Cancer Father. Thyroid problems Sister.  Pregnancy / Birth History Age at menarche 22 years. Age of menopause 76-60 Gravida 0 Irregular periods    Review of Systems General Present- Fatigue. Not Present- Appetite Loss, Chills, Fever, Night Sweats, Weight Gain and Weight Loss. HEENT Not Present- Earache, Hearing Loss, Hoarseness, Nose Bleed, Oral Ulcers, Ringing in the Ears, Seasonal Allergies, Sinus Pain, Sore Throat, Visual Disturbances, Wears glasses/contact lenses and Yellow Eyes. Respiratory Not Present- Bloody sputum, Chronic Cough, Difficulty Breathing, Snoring and Wheezing. Cardiovascular Present- Swelling of Extremities. Not Present- Chest Pain, Difficulty Breathing Lying Down, Leg Cramps, Palpitations, Rapid Heart Rate and Shortness of Breath. Gastrointestinal Not Present- Abdominal Pain, Bloating, Bloody Stool, Change in Bowel Habits, Chronic diarrhea, Constipation, Difficulty Swallowing, Excessive gas, Gets full quickly at meals, Hemorrhoids, Indigestion, Nausea, Rectal Pain and Vomiting. Female Genitourinary Not Present- Frequency, Nocturia, Painful Urination, Pelvic Pain and Urgency. Musculoskeletal Present- Back Pain, Joint Pain and Muscle Pain. Not Present- Joint Stiffness, Muscle Weakness and Swelling of Extremities. Neurological Not Present- Decreased Memory, Fainting, Headaches, Numbness, Seizures, Tingling, Tremor, Trouble walking and Weakness. Hematology Not Present- Easy Bruising, Excessive bleeding, Gland problems, HIV and Persistent Infections.  Vitals Weight: 338.2 lb Height: 64in Body Surface Area: 2.63 m Body Mass Index: 58.05 kg/m  Pulse: 88 (Regular)  BP: 130/80 (Sitting, Left Arm, Standard)     Physical Exam  General - appears comfortable, no distress; not diaphorectic  HEENT - normocephalic; sclerae clear, gaze conjugate; mucous membranes moist, dentition good; voice normal  Neck -  symmetric on extension; no palpable anterior or posterior cervical adenopathy; no palpable masses in the thyroid bed  Chest - clear bilaterally without rhonchi, rales, or wheeze  Cor - regular rhythm with normal rate; no significant murmur  Ext - non-tender with mild edema or lymphedema  Neuro - grossly intact; no tremor  Assessment & Plan  PRIMARY HYPERPARATHYROIDISM (E21.0)  Patient is referred for surgical management of primary hyperparathyroidism. Nuclear medicine scan localizes a left inferior parathyroid adenoma.  I have recommended minimally invasive parathyroidectomy. I have discussed the risk and benefits of the procedure with the patient and her sister. I have provided them with written literature on parathyroid surgery to review at home. Patient would like to proceed with surgery. We will make arrangements for her procedure in the near future.  The risks and benefits of the procedure have been discussed at length with the patient. The patient understands the proposed procedure, potential alternative treatments, and the course of recovery to be expected. All of the patient's questions have been answered at this time. The patient wishes to proceed with surgery.  Earnstine Regal, MD, [facility] Office: [phone]

## 2015-06-30 ENCOUNTER — Ambulatory Visit (HOSPITAL_COMMUNITY): Payer: BLUE CROSS/BLUE SHIELD | Admitting: Anesthesiology

## 2015-06-30 ENCOUNTER — Encounter (HOSPITAL_COMMUNITY)
Admission: RE | Disposition: A | Discharge status: Home / Self Care | Payer: Self-pay | Source: Ambulatory Visit | Attending: Surgery

## 2015-06-30 ENCOUNTER — Encounter (HOSPITAL_COMMUNITY): Payer: Self-pay | Admitting: Anesthesiology

## 2015-06-30 ENCOUNTER — Ambulatory Visit (HOSPITAL_COMMUNITY)
Admission: RE | Admit: 2015-06-30 | Discharge: 2015-06-30 | Disposition: A | Discharge status: Home / Self Care | Payer: BLUE CROSS/BLUE SHIELD | Source: Ambulatory Visit | Attending: Surgery | Admitting: Surgery

## 2015-06-30 DIAGNOSIS — M1990 Unspecified osteoarthritis, unspecified site: Secondary | ICD-10-CM | POA: Diagnosis not present

## 2015-06-30 DIAGNOSIS — F329 Major depressive disorder, single episode, unspecified: Secondary | ICD-10-CM | POA: Insufficient documentation

## 2015-06-30 DIAGNOSIS — G4730 Sleep apnea, unspecified: Secondary | ICD-10-CM | POA: Diagnosis not present

## 2015-06-30 DIAGNOSIS — Z6841 Body Mass Index (BMI) 40.0 and over, adult: Secondary | ICD-10-CM | POA: Diagnosis not present

## 2015-06-30 DIAGNOSIS — I10 Essential (primary) hypertension: Secondary | ICD-10-CM | POA: Diagnosis not present

## 2015-06-30 DIAGNOSIS — Z794 Long term (current) use of insulin: Secondary | ICD-10-CM | POA: Diagnosis not present

## 2015-06-30 DIAGNOSIS — E7800 Pure hypercholesterolemia, unspecified: Secondary | ICD-10-CM | POA: Diagnosis not present

## 2015-06-30 DIAGNOSIS — E210 Primary hyperparathyroidism: Secondary | ICD-10-CM

## 2015-06-30 DIAGNOSIS — E119 Type 2 diabetes mellitus without complications: Secondary | ICD-10-CM | POA: Insufficient documentation

## 2015-06-30 DIAGNOSIS — M8580 Other specified disorders of bone density and structure, unspecified site: Secondary | ICD-10-CM | POA: Diagnosis not present

## 2015-06-30 DIAGNOSIS — D351 Benign neoplasm of parathyroid gland: Secondary | ICD-10-CM | POA: Insufficient documentation

## 2015-06-30 HISTORY — PX: PARATHYROIDECTOMY: SHX19

## 2015-06-30 LAB — GLUCOSE, CAPILLARY
Glucose-Capillary: 103 mg/dL — ABNORMAL HIGH (ref 65–99)
Glucose-Capillary: 155 mg/dL — ABNORMAL HIGH (ref 65–99)

## 2015-06-30 SURGERY — PARATHYROIDECTOMY
Anesthesia: General | Site: Neck | Laterality: Left

## 2015-06-30 MED ORDER — FENTANYL CITRATE (PF) 250 MCG/5ML IJ SOLN
INTRAMUSCULAR | Status: AC
  Filled 2015-06-30: qty 25

## 2015-06-30 MED ORDER — PROMETHAZINE HCL 25 MG/ML IJ SOLN: 6.2500 mg | INTRAMUSCULAR | Status: DC | PRN

## 2015-06-30 MED ORDER — GLYCOPYRROLATE 0.2 MG/ML IJ SOLN
INTRAMUSCULAR | Status: AC
  Filled 2015-06-30: qty 3

## 2015-06-30 MED ORDER — LABETALOL HCL 5 MG/ML IV SOLN
INTRAVENOUS | Status: DC | PRN
Start: 2015-06-30 — End: 2015-06-30
  Administered 2015-06-30: 5 mg via INTRAVENOUS

## 2015-06-30 MED ORDER — HYDROCODONE-ACETAMINOPHEN 5-325 MG PO TABS: 1.0000 | ORAL_TABLET | ORAL | Status: DC | PRN

## 2015-06-30 MED ORDER — LACTATED RINGERS IV SOLN: INTRAVENOUS | Status: DC

## 2015-06-30 MED ORDER — SUCCINYLCHOLINE CHLORIDE 20 MG/ML IJ SOLN
INTRAMUSCULAR | Status: DC | PRN
  Administered 2015-06-30: 160 mg via INTRAVENOUS

## 2015-06-30 MED ORDER — HYDROCODONE-ACETAMINOPHEN 5-325 MG PO TABS
1.0000 | ORAL_TABLET | ORAL | Status: DC | PRN
  Administered 2015-06-30: 1 via ORAL
  Filled 2015-06-30: qty 1

## 2015-06-30 MED ORDER — CEFAZOLIN SODIUM 10 G IJ SOLR
3.0000 g | INTRAMUSCULAR | Status: AC
  Administered 2015-06-30: 3 g via INTRAVENOUS
  Filled 2015-06-30: qty 3000

## 2015-06-30 MED ORDER — GLYCOPYRROLATE 0.2 MG/ML IJ SOLN
INTRAMUSCULAR | Status: DC | PRN
  Administered 2015-06-30: .6 mg via INTRAVENOUS

## 2015-06-30 MED ORDER — PROPOFOL 10 MG/ML IV BOLUS
INTRAVENOUS | Status: AC
  Filled 2015-06-30: qty 20

## 2015-06-30 MED ORDER — MIDAZOLAM HCL 5 MG/5ML IJ SOLN
INTRAMUSCULAR | Status: DC | PRN
  Administered 2015-06-30: 2 mg via INTRAVENOUS

## 2015-06-30 MED ORDER — BUPIVACAINE HCL (PF) 0.25 % IJ SOLN
INTRAMUSCULAR | Status: AC
  Filled 2015-06-30: qty 30

## 2015-06-30 MED ORDER — FENTANYL CITRATE (PF) 100 MCG/2ML IJ SOLN
INTRAMUSCULAR | Status: AC
  Filled 2015-06-30: qty 2

## 2015-06-30 MED ORDER — ROCURONIUM BROMIDE 100 MG/10ML IV SOLN
INTRAVENOUS | Status: AC
  Filled 2015-06-30: qty 1

## 2015-06-30 MED ORDER — ONDANSETRON HCL 4 MG/2ML IJ SOLN
INTRAMUSCULAR | Status: DC | PRN
  Administered 2015-06-30: 4 mg via INTRAVENOUS

## 2015-06-30 MED ORDER — ROCURONIUM BROMIDE 100 MG/10ML IV SOLN
INTRAVENOUS | Status: DC | PRN
  Administered 2015-06-30: 50 mg via INTRAVENOUS
  Administered 2015-06-30: 10 mg via INTRAVENOUS

## 2015-06-30 MED ORDER — 0.9 % SODIUM CHLORIDE (POUR BTL) OPTIME
TOPICAL | Status: DC | PRN
  Administered 2015-06-30: 1000 mL

## 2015-06-30 MED ORDER — LACTATED RINGERS IV SOLN
INTRAVENOUS | Status: DC | PRN
  Administered 2015-06-30: 10:00:00 via INTRAVENOUS

## 2015-06-30 MED ORDER — LIDOCAINE HCL (CARDIAC) 20 MG/ML IV SOLN
INTRAVENOUS | Status: DC | PRN
  Administered 2015-06-30: 50 mg via INTRAVENOUS

## 2015-06-30 MED ORDER — PROPOFOL 10 MG/ML IV BOLUS
INTRAVENOUS | Status: DC | PRN
  Administered 2015-06-30: 200 mg via INTRAVENOUS

## 2015-06-30 MED ORDER — NEOSTIGMINE METHYLSULFATE 10 MG/10ML IV SOLN
INTRAVENOUS | Status: DC | PRN
Start: 2015-06-30 — End: 2015-06-30
  Administered 2015-06-30: 4 mg via INTRAVENOUS

## 2015-06-30 MED ORDER — PHENYLEPHRINE 40 MCG/ML (10ML) SYRINGE FOR IV PUSH (FOR BLOOD PRESSURE SUPPORT)
PREFILLED_SYRINGE | INTRAVENOUS | Status: AC
  Filled 2015-06-30: qty 10

## 2015-06-30 MED ORDER — BUPIVACAINE HCL 0.25 % IJ SOLN
INTRAMUSCULAR | Status: DC | PRN
  Administered 2015-06-30: 10 mL

## 2015-06-30 MED ORDER — ONDANSETRON HCL 4 MG/2ML IJ SOLN
INTRAMUSCULAR | Status: AC
  Filled 2015-06-30: qty 2

## 2015-06-30 MED ORDER — FENTANYL CITRATE (PF) 100 MCG/2ML IJ SOLN
INTRAMUSCULAR | Status: DC | PRN
  Administered 2015-06-30: 50 ug via INTRAVENOUS
  Administered 2015-06-30: 100 ug via INTRAVENOUS
  Administered 2015-06-30 (×2): 50 ug via INTRAVENOUS

## 2015-06-30 MED ORDER — FENTANYL CITRATE (PF) 100 MCG/2ML IJ SOLN
25.0000 ug | INTRAMUSCULAR | Status: DC | PRN
  Administered 2015-06-30 (×3): 50 ug via INTRAVENOUS

## 2015-06-30 MED ORDER — MIDAZOLAM HCL 2 MG/2ML IJ SOLN
INTRAMUSCULAR | Status: AC
  Filled 2015-06-30: qty 4

## 2015-06-30 MED ORDER — PHENYLEPHRINE HCL 10 MG/ML IJ SOLN
INTRAMUSCULAR | Status: DC | PRN
  Administered 2015-06-30: 40 ug via INTRAVENOUS
  Administered 2015-06-30 (×2): 80 ug via INTRAVENOUS
  Administered 2015-06-30 (×2): 40 ug via INTRAVENOUS

## 2015-06-30 MED ORDER — NEOSTIGMINE METHYLSULFATE 10 MG/10ML IV SOLN
INTRAVENOUS | Status: AC
  Filled 2015-06-30: qty 1

## 2015-06-30 SURGICAL SUPPLY — 35 items
ATTRACTOMAT 16X20 MAGNETIC DRP (DRAPES) ×2 IMPLANT
BENZOIN TINCTURE PRP APPL 2/3 (GAUZE/BANDAGES/DRESSINGS) IMPLANT
BLADE HEX COATED 2.75 (ELECTRODE) ×2 IMPLANT
BLADE SURG 15 STRL LF DISP TIS (BLADE) ×1 IMPLANT
BLADE SURG 15 STRL SS (BLADE) ×1
CHLORAPREP W/TINT 26ML (MISCELLANEOUS) ×2 IMPLANT
CLIP TI MEDIUM 6 (CLIP) ×4 IMPLANT
CLIP TI WIDE RED SMALL 6 (CLIP) ×4 IMPLANT
COVER SURGICAL LIGHT HANDLE (MISCELLANEOUS) ×2 IMPLANT
DRAPE LAPAROTOMY T 98X78 PEDS (DRAPES) ×2 IMPLANT
DRESSING SURGICEL FIBRLLR 1X2 (HEMOSTASIS) ×1 IMPLANT
DRSG SURGICEL FIBRILLAR 1X2 (HEMOSTASIS) ×2
ELECT PENCIL ROCKER SW 15FT (MISCELLANEOUS) ×2 IMPLANT
ELECT REM PT RETURN 9FT ADLT (ELECTROSURGICAL) ×2
ELECTRODE REM PT RTRN 9FT ADLT (ELECTROSURGICAL) ×1 IMPLANT
GAUZE SPONGE 4X4 16PLY XRAY LF (GAUZE/BANDAGES/DRESSINGS) ×2 IMPLANT
GLOVE SURG ORTHO 8.0 STRL STRW (GLOVE) ×2 IMPLANT
GOWN STRL REUS W/TWL XL LVL3 (GOWN DISPOSABLE) ×6 IMPLANT
KIT BASIN OR (CUSTOM PROCEDURE TRAY) ×2 IMPLANT
LIQUID BAND (GAUZE/BANDAGES/DRESSINGS) IMPLANT
NEEDLE HYPO 25X1 1.5 SAFETY (NEEDLE) ×2 IMPLANT
PACK BASIC VI WITH GOWN DISP (CUSTOM PROCEDURE TRAY) ×2 IMPLANT
STAPLER VISISTAT 35W (STAPLE) ×2 IMPLANT
STRIP CLOSURE SKIN 1/2X4 (GAUZE/BANDAGES/DRESSINGS) IMPLANT
SUT MNCRL AB 4-0 PS2 18 (SUTURE) ×2 IMPLANT
SUT SILK 2 0 (SUTURE)
SUT SILK 2-0 18XBRD TIE 12 (SUTURE) IMPLANT
SUT SILK 3 0 (SUTURE)
SUT SILK 3-0 18XBRD TIE 12 (SUTURE) IMPLANT
SUT VIC AB 3-0 SH 18 (SUTURE) ×2 IMPLANT
SYR BULB IRRIGATION 50ML (SYRINGE) ×2 IMPLANT
SYR CONTROL 10ML LL (SYRINGE) ×2 IMPLANT
TOWEL OR 17X26 10 PK STRL BLUE (TOWEL DISPOSABLE) ×2 IMPLANT
TOWEL OR NON WOVEN STRL DISP B (DISPOSABLE) ×2 IMPLANT
YANKAUER SUCT BULB TIP 10FT TU (MISCELLANEOUS) ×2 IMPLANT

## 2015-06-30 NOTE — Transfer of Care (Signed)
Immediate Anesthesia Transfer of Care Note  Patient: Stacey Wise  Procedure(s) Performed: Procedure(s): LEFT PARATHYROIDECTOMY (Left)  Patient Location: PACU  Anesthesia Type:General  Level of Consciousness:  sedated, patient cooperative and responds to stimulation  Airway & Oxygen Therapy:Patient Spontanous Breathing and Patient connected to face mask oxgen  Post-op Assessment:  Report given to PACU RN and Post -op Vital signs reviewed and stable  Post vital signs:  Reviewed and stable  Last Vitals:  Filed Vitals:   06/30/15 0734  BP: 153/77  Pulse: 98  Temp: 37.1 C  Resp: 18    Complications: No apparent anesthesia complications

## 2015-06-30 NOTE — Anesthesia Procedure Notes (Signed)
Procedure Name: Intubation Date/Time: 06/30/2015 10:12 AM Performed by: Anne Fu Pre-anesthesia Checklist: Patient identified, Emergency Drugs available, Suction available, Patient being monitored and Timeout performed Patient Re-evaluated:Patient Re-evaluated prior to inductionOxygen Delivery Method: Circle system utilized Preoxygenation: Pre-oxygenation with 100% oxygen Intubation Type: IV induction Ventilation: Mask ventilation without difficulty and Oral airway inserted - appropriate to patient size Laryngoscope Size: Mac and 4 Grade View: Grade I Tube type: Oral Tube size: 7.5 mm Number of attempts: 1 Airway Equipment and Method: Stylet Placement Confirmation: ETT inserted through vocal cords under direct vision,  positive ETCO2,  CO2 detector and breath sounds checked- equal and bilateral Secured at: 21 cm Tube secured with: Tape Dental Injury: Teeth and Oropharynx as per pre-operative assessment  Comments: Patient moved to OR table and intubation pillow places under patient in appropriate position, noted GRADE 1 X 1 DL without incidences.

## 2015-06-30 NOTE — Op Note (Signed)
OPERATIVE REPORT - PARATHYROIDECTOMY  Preoperative diagnosis: Primary hyperparathyroidism  Postop diagnosis: Same  Procedure: Left minimally invasive parathyroidectomy  Surgeon:  Earnstine Regal, MD, FACS  Anesthesia: Gen. endotracheal  Estimated blood loss: Minimal  Preparation: ChloraPrep  Indications: Patient referred by Dr. Delrae Rend for surgical management of primary hyperparathyroidism. Patient's primary care physician is Dr. Yaakov Guthrie. Patient presents with signs and symptoms of primary hyperparathyroidism. She was initially diagnosed because of hypercalcemia. Recent calcium level was elevated at 11.3. Intact PTH level is elevated at 101. 24-hour urine collection for calcium was elevated at 282. Patient underwent nuclear medicine parathyroid scan which localized the parathyroid adenoma to the left inferior position. Patient has had no prior surgery on the head or neck.   Procedure: Patient was prepared in the holding area. He was brought to operating room and placed in a supine position on the operating room table. Following administration of general anesthesia, the patient was positioned and then prepped and draped in the usual strict aseptic fashion. After ascertaining that an adequate level of anesthesia been achieved, a neck incision was made with a #15 blade. Dissection was carried through subcutaneous tissues and platysma. Hemostasis was obtained with the electrocautery. Skin flaps were developed circumferentially and a Weitlander retractor was placed for exposure.  Strap muscles were incised in the midline. Strap muscles were reflected exposing the thyroid lobe. With gentle blunt dissection the thyroid lobe was mobilized.  Dissection was carried through adipose tissue and an enlarged parathyroid gland was identified. It was gently mobilized. Vascular structures were divided between small and medium ligaclips. Care was taken to avoid the recurrent laryngeal nerve and the  esophagus. The parathyroid gland was completely excised. It was submitted to pathology where frozen section confirmed parathyroid tissue consistent with adenoma.  Neck was irrigated with warm saline and good hemostasis was noted. Fibrillar was placed in the operative field. Strap muscles were reapproximated in the midline with interrupted 3-0 Vicryl sutures. Platysma was closed with interrupted 3-0 Vicryl sutures. Skin was closed with a running 4-0 Monocryl subcuticular suture. Marcaine was infiltrated circumferentially. Wound was washed and dried and benzoin and Steri-Strips were applied. Sterile gauze dressings were applied. Patient was awakened from anesthesia and brought to the recovery room. The patient tolerated the procedure well.   Earnstine Regal, MD, [facility]

## 2015-06-30 NOTE — Interval H&P Note (Signed)
History and Physical Interval Note:  06/30/2015 9:45 AM  Stacey Wise  has presented today for surgery, with the diagnosis of Primary Hyperparathyroidism.  The various methods of treatment have been discussed with the patient and family. After consideration of risks, benefits and other options for treatment, the patient has consented to    Procedure(s): LEFT INFERIOR PARATHYROIDECTOMY (Left) as a surgical intervention .    The patient's history has been reviewed, patient examined, no change in status, stable for surgery.  I have reviewed the patient's chart and labs.  Questions were answered to the patient's satisfaction.    Earnstine Regal, MD, [facility] Office: [place]

## 2015-06-30 NOTE — Anesthesia Preprocedure Evaluation (Signed)
Anesthesia Evaluation  Patient identified by MRN, date of birth, ID band Patient awake    Reviewed: Allergy & Precautions, NPO status , Patient's Chart, lab work & pertinent test results  History of Anesthesia Complications (+) Family history of anesthesia reaction  Airway Mallampati: II  TM Distance: >3 FB Neck ROM: Full    Dental no notable dental hx.    Pulmonary sleep apnea ,    Pulmonary exam normal breath sounds clear to auscultation       Cardiovascular hypertension, Pt. on medications Normal cardiovascular exam Rhythm:Regular Rate:Normal     Neuro/Psych negative neurological ROS  negative psych ROS   GI/Hepatic negative GI ROS, Neg liver ROS,   Endo/Other  diabetes, Type 2, Oral Hypoglycemic Agents, Insulin DependentMorbid obesity  Renal/GU negative Renal ROS  negative genitourinary   Musculoskeletal  (+) Arthritis ,   Abdominal (+) + obese,   Peds negative pediatric ROS (+)  Hematology negative hematology ROS (+)   Anesthesia Other Findings   Reproductive/Obstetrics negative OB ROS                             Anesthesia Physical Anesthesia Plan  ASA: III  Anesthesia Plan: General   Post-op Pain Management:    Induction: Intravenous  Airway Management Planned: Oral ETT  Additional Equipment:   Intra-op Plan:   Post-operative Plan: Extubation in OR  Informed Consent: I have reviewed the patients History and Physical, chart, labs and discussed the procedure including the risks, benefits and alternatives for the proposed anesthesia with the patient or authorized representative who has indicated his/her understanding and acceptance.   Dental advisory given  Plan Discussed with: CRNA  Anesthesia Plan Comments:         Anesthesia Quick Evaluation

## 2015-07-01 NOTE — Addendum Note (Signed)
Addendum  created 07/01/15 0032 by Franne Grip, MD   Modules edited: Notes Section   Notes Section:  File: [id number]

## 2015-07-01 NOTE — Anesthesia Postprocedure Evaluation (Addendum)
  Anesthesia Post-op Note  Patient: Stacey Wise  Procedure(s) Performed: Procedure(s) (LRB): LEFT PARATHYROIDECTOMY (Left)  Patient Location: PACU  Anesthesia Type: General  Level of Consciousness: awake and alert   Airway and Oxygen Therapy: Patient Spontanous Breathing  Post-op Pain: mild  Post-op Assessment: Post-op Vital signs reviewed, Patient's Cardiovascular Status Stable, Respiratory Function Stable, Patent Airway and No signs of Nausea or vomiting. OSA orders and precautions used.  Last Vitals:  Filed Vitals:   06/30/15 1420  BP: 147/69  Pulse: 86  Temp:   Resp: 20    Post-op Vital Signs: stable   Complications: No apparent anesthesia complications

## 2015-07-04 ENCOUNTER — Telehealth: Payer: Self-pay | Admitting: General Surgery

## 2015-07-04 ENCOUNTER — Other Ambulatory Visit: Payer: Self-pay | Admitting: Endocrinology

## 2015-07-04 NOTE — Telephone Encounter (Signed)
Pt called complaining of rash above her incision.  She states her surgery was 5 days ago. She denies any erythema or drainage around the wound itself. I recommended that she gently remove the steri strips and use benadryl cream on the rash.  She will call the office if this does not resolve.

## 2015-07-06 NOTE — Telephone Encounter (Signed)
Please refill x 1 Ov is due  

## 2015-07-06 NOTE — Telephone Encounter (Signed)
Please advise if ok to refill. Last office visit was 07/04/2015.

## 2015-07-14 ENCOUNTER — Other Ambulatory Visit: Payer: Self-pay | Admitting: Endocrinology

## 2015-08-19 ENCOUNTER — Other Ambulatory Visit: Payer: Self-pay | Admitting: Endocrinology

## 2015-10-03 ENCOUNTER — Other Ambulatory Visit: Payer: Self-pay | Admitting: Endocrinology

## 2015-10-18 ENCOUNTER — Other Ambulatory Visit: Payer: Self-pay | Admitting: Endocrinology

## 2016-01-18 ENCOUNTER — Other Ambulatory Visit: Payer: Self-pay | Admitting: Endocrinology

## 2016-02-11 DIAGNOSIS — Z8639 Personal history of other endocrine, nutritional and metabolic disease: Secondary | ICD-10-CM | POA: Diagnosis not present

## 2016-02-11 DIAGNOSIS — Z794 Long term (current) use of insulin: Secondary | ICD-10-CM | POA: Diagnosis not present

## 2016-02-11 DIAGNOSIS — E119 Type 2 diabetes mellitus without complications: Secondary | ICD-10-CM | POA: Diagnosis not present

## 2016-03-15 DIAGNOSIS — Z794 Long term (current) use of insulin: Secondary | ICD-10-CM | POA: Diagnosis not present

## 2016-03-15 DIAGNOSIS — E1165 Type 2 diabetes mellitus with hyperglycemia: Secondary | ICD-10-CM | POA: Diagnosis not present

## 2016-03-15 DIAGNOSIS — E119 Type 2 diabetes mellitus without complications: Secondary | ICD-10-CM | POA: Diagnosis not present

## 2016-03-15 DIAGNOSIS — E785 Hyperlipidemia, unspecified: Secondary | ICD-10-CM | POA: Diagnosis not present

## 2016-03-15 DIAGNOSIS — I10 Essential (primary) hypertension: Secondary | ICD-10-CM | POA: Diagnosis not present

## 2016-03-15 DIAGNOSIS — Z7984 Long term (current) use of oral hypoglycemic drugs: Secondary | ICD-10-CM | POA: Diagnosis not present

## 2016-03-17 ENCOUNTER — Other Ambulatory Visit: Payer: Self-pay | Admitting: Endocrinology

## 2016-03-17 DIAGNOSIS — R946 Abnormal results of thyroid function studies: Secondary | ICD-10-CM | POA: Diagnosis not present

## 2016-03-31 ENCOUNTER — Other Ambulatory Visit: Payer: Self-pay | Admitting: Endocrinology

## 2016-04-01 ENCOUNTER — Other Ambulatory Visit: Payer: Self-pay | Admitting: Obstetrics and Gynecology

## 2016-04-01 DIAGNOSIS — Z6841 Body Mass Index (BMI) 40.0 and over, adult: Secondary | ICD-10-CM | POA: Diagnosis not present

## 2016-04-01 DIAGNOSIS — Z1231 Encounter for screening mammogram for malignant neoplasm of breast: Secondary | ICD-10-CM | POA: Diagnosis not present

## 2016-04-01 DIAGNOSIS — Z124 Encounter for screening for malignant neoplasm of cervix: Secondary | ICD-10-CM | POA: Diagnosis not present

## 2016-04-01 DIAGNOSIS — Z01419 Encounter for gynecological examination (general) (routine) without abnormal findings: Secondary | ICD-10-CM | POA: Diagnosis not present

## 2016-04-04 LAB — CYTOLOGY - PAP

## 2016-04-07 ENCOUNTER — Other Ambulatory Visit: Payer: Self-pay | Admitting: Endocrinology

## 2016-04-12 DIAGNOSIS — E785 Hyperlipidemia, unspecified: Secondary | ICD-10-CM | POA: Diagnosis not present

## 2016-04-12 DIAGNOSIS — Z6841 Body Mass Index (BMI) 40.0 and over, adult: Secondary | ICD-10-CM | POA: Diagnosis not present

## 2016-04-12 DIAGNOSIS — I10 Essential (primary) hypertension: Secondary | ICD-10-CM | POA: Diagnosis not present

## 2016-04-12 DIAGNOSIS — E669 Obesity, unspecified: Secondary | ICD-10-CM | POA: Diagnosis not present

## 2016-04-12 DIAGNOSIS — Z794 Long term (current) use of insulin: Secondary | ICD-10-CM | POA: Diagnosis not present

## 2016-04-12 DIAGNOSIS — E119 Type 2 diabetes mellitus without complications: Secondary | ICD-10-CM | POA: Diagnosis not present

## 2016-05-12 DIAGNOSIS — E119 Type 2 diabetes mellitus without complications: Secondary | ICD-10-CM | POA: Diagnosis not present

## 2016-05-12 DIAGNOSIS — E1165 Type 2 diabetes mellitus with hyperglycemia: Secondary | ICD-10-CM | POA: Diagnosis not present

## 2016-05-12 DIAGNOSIS — E785 Hyperlipidemia, unspecified: Secondary | ICD-10-CM | POA: Diagnosis not present

## 2016-05-12 DIAGNOSIS — I10 Essential (primary) hypertension: Secondary | ICD-10-CM | POA: Diagnosis not present

## 2016-05-27 DIAGNOSIS — N950 Postmenopausal bleeding: Secondary | ICD-10-CM | POA: Diagnosis not present

## 2016-06-13 NOTE — Patient Instructions (Addendum)
Your procedure is scheduled on:  Monday, Oct. 30, 2017  Enter through the [employer] [facility] at:  10:45 AM  Pick up the phone at the desk and dial [phone].  Call this number if you have problems the morning of surgery: [phone].  Remember: Do NOT eat food or drink after:  Midnight Sunday, Oct. 29, 2017  Take these medicines the morning of surgery with a SIP OF WATER:  Atorvastatin, Diltiazem, Hydralazine, Torsemide, Losartan  Do Not take Metformin the morning of surgery  Do not take bedtime insulin dose the evening prior to surgery  Stop ALL herbal medications at this time   Do NOT wear jewelry (body piercing), metal hair clips/bobby pins, make-up, or nail polish. Do NOT wear lotions, powders, or perfumes.  You may wear deodorant. Do NOT shave for 48 hours prior to surgery. Do NOT bring valuables to the hospital. Contacts, dentures, or bridgework may not be worn into surgery.  Have a responsible adult drive you home and stay with you for 24 hours after your procedure

## 2016-06-14 ENCOUNTER — Other Ambulatory Visit: Payer: Self-pay | Admitting: Obstetrics and Gynecology

## 2016-06-15 ENCOUNTER — Other Ambulatory Visit: Payer: Self-pay

## 2016-06-15 ENCOUNTER — Encounter (HOSPITAL_COMMUNITY)
Admission: RE | Admit: 2016-06-15 | Discharge: 2016-06-15 | Disposition: A | Discharge status: Home / Self Care | Payer: BLUE CROSS/BLUE SHIELD | Source: Ambulatory Visit | Attending: Obstetrics and Gynecology | Admitting: Obstetrics and Gynecology

## 2016-06-15 ENCOUNTER — Encounter (HOSPITAL_COMMUNITY): Payer: Self-pay

## 2016-06-15 DIAGNOSIS — Z01818 Encounter for other preprocedural examination: Secondary | ICD-10-CM | POA: Insufficient documentation

## 2016-06-15 HISTORY — DX: Adverse effect of unspecified anesthetic, initial encounter: T41.45XA

## 2016-06-15 HISTORY — DX: Calcaneal spur, right foot: M77.31

## 2016-06-15 HISTORY — DX: Other complications of anesthesia, initial encounter: T88.59XA

## 2016-06-15 HISTORY — DX: Pneumonia, unspecified organism: J18.9

## 2016-06-15 HISTORY — DX: Leiomyoma of uterus, unspecified: D25.9

## 2016-06-15 HISTORY — DX: Inflammatory liver disease, unspecified: K75.9

## 2016-06-15 LAB — CBC
HCT: 38.5 % (ref 36.0–46.0)
Hemoglobin: 12.4 g/dL (ref 12.0–15.0)
MCH: 26.7 pg (ref 26.0–34.0)
MCHC: 32.2 g/dL (ref 30.0–36.0)
MCV: 83 fL (ref 78.0–100.0)
Platelets: 295 10*3/uL (ref 150–400)
RBC: 4.64 MIL/uL (ref 3.87–5.11)
RDW: 14.9 % (ref 11.5–15.5)
WBC: 14 10*3/uL — ABNORMAL HIGH (ref 4.0–10.5)

## 2016-06-15 LAB — BASIC METABOLIC PANEL
Anion gap: 5 (ref 5–15)
BUN: 22 mg/dL — ABNORMAL HIGH (ref 6–20)
CO2: 28 mmol/L (ref 22–32)
Calcium: 9.9 mg/dL (ref 8.9–10.3)
Chloride: 108 mmol/L (ref 101–111)
Creatinine, Ser: 0.92 mg/dL (ref 0.44–1.00)
GFR calc Af Amer: >60 mL/min (ref >60)
GFR calc non Af Amer: >60 mL/min (ref >60)
Glucose, Bld: 227 mg/dL — ABNORMAL HIGH (ref 65–99)
Potassium: 3.6 mmol/L (ref 3.5–5.1)
Sodium: 141 mmol/L (ref 135–145)

## 2016-06-15 NOTE — Pre-Procedure Instructions (Signed)
Left a message for Stacey Wise at Dr. Harrington Challenger' office to inform them of elevated glucose level.

## 2016-06-16 DIAGNOSIS — M722 Plantar fascial fibromatosis: Secondary | ICD-10-CM | POA: Diagnosis not present

## 2016-06-16 DIAGNOSIS — M7731 Calcaneal spur, right foot: Secondary | ICD-10-CM | POA: Diagnosis not present

## 2016-06-16 DIAGNOSIS — M76821 Posterior tibial tendinitis, right leg: Secondary | ICD-10-CM | POA: Diagnosis not present

## 2016-06-16 DIAGNOSIS — M659 Synovitis and tenosynovitis, unspecified: Secondary | ICD-10-CM | POA: Diagnosis not present

## 2016-06-16 DIAGNOSIS — M71571 Other bursitis, not elsewhere classified, right ankle and foot: Secondary | ICD-10-CM | POA: Diagnosis not present

## 2016-06-27 ENCOUNTER — Encounter (HOSPITAL_COMMUNITY): Payer: Self-pay | Admitting: Emergency Medicine

## 2016-06-27 ENCOUNTER — Ambulatory Visit (HOSPITAL_COMMUNITY)
Admission: RE | Admit: 2016-06-27 | Discharge: 2016-06-27 | Disposition: A | Discharge status: Home / Self Care | Payer: BLUE CROSS/BLUE SHIELD | Source: Ambulatory Visit | Attending: Obstetrics and Gynecology | Admitting: Obstetrics and Gynecology

## 2016-06-27 ENCOUNTER — Ambulatory Visit (HOSPITAL_COMMUNITY): Payer: BLUE CROSS/BLUE SHIELD | Admitting: Anesthesiology

## 2016-06-27 ENCOUNTER — Encounter (HOSPITAL_COMMUNITY)
Admission: RE | Disposition: A | Discharge status: Home / Self Care | Payer: Self-pay | Source: Ambulatory Visit | Attending: Obstetrics and Gynecology

## 2016-06-27 DIAGNOSIS — C541 Malignant neoplasm of endometrium: Secondary | ICD-10-CM | POA: Diagnosis not present

## 2016-06-27 DIAGNOSIS — Z833 Family history of diabetes mellitus: Secondary | ICD-10-CM | POA: Insufficient documentation

## 2016-06-27 DIAGNOSIS — Z7982 Long term (current) use of aspirin: Secondary | ICD-10-CM | POA: Diagnosis not present

## 2016-06-27 DIAGNOSIS — Z6841 Body Mass Index (BMI) 40.0 and over, adult: Secondary | ICD-10-CM | POA: Diagnosis not present

## 2016-06-27 DIAGNOSIS — Z8249 Family history of ischemic heart disease and other diseases of the circulatory system: Secondary | ICD-10-CM | POA: Diagnosis not present

## 2016-06-27 DIAGNOSIS — Z794 Long term (current) use of insulin: Secondary | ICD-10-CM | POA: Insufficient documentation

## 2016-06-27 DIAGNOSIS — M1990 Unspecified osteoarthritis, unspecified site: Secondary | ICD-10-CM | POA: Diagnosis not present

## 2016-06-27 DIAGNOSIS — G4730 Sleep apnea, unspecified: Secondary | ICD-10-CM | POA: Diagnosis not present

## 2016-06-27 DIAGNOSIS — N950 Postmenopausal bleeding: Secondary | ICD-10-CM | POA: Diagnosis not present

## 2016-06-27 DIAGNOSIS — I10 Essential (primary) hypertension: Secondary | ICD-10-CM | POA: Diagnosis not present

## 2016-06-27 DIAGNOSIS — Z8261 Family history of arthritis: Secondary | ICD-10-CM | POA: Diagnosis not present

## 2016-06-27 DIAGNOSIS — E1042 Type 1 diabetes mellitus with diabetic polyneuropathy: Secondary | ICD-10-CM | POA: Diagnosis not present

## 2016-06-27 DIAGNOSIS — N841 Polyp of cervix uteri: Secondary | ICD-10-CM | POA: Insufficient documentation

## 2016-06-27 DIAGNOSIS — E785 Hyperlipidemia, unspecified: Secondary | ICD-10-CM | POA: Insufficient documentation

## 2016-06-27 DIAGNOSIS — B1910 Unspecified viral hepatitis B without hepatic coma: Secondary | ICD-10-CM | POA: Insufficient documentation

## 2016-06-27 DIAGNOSIS — Z8042 Family history of malignant neoplasm of prostate: Secondary | ICD-10-CM | POA: Diagnosis not present

## 2016-06-27 HISTORY — PX: DILATION AND CURETTAGE OF UTERUS: SHX78

## 2016-06-27 HISTORY — PX: CERVICAL POLYPECTOMY: SHX88

## 2016-06-27 HISTORY — PX: HYSTEROSCOPY: SHX211

## 2016-06-27 LAB — GLUCOSE, CAPILLARY: Glucose-Capillary: 148 mg/dL — ABNORMAL HIGH (ref 65–99)

## 2016-06-27 SURGERY — HYSTEROSCOPY
Anesthesia: General

## 2016-06-27 MED ORDER — FENTANYL CITRATE (PF) 100 MCG/2ML IJ SOLN
INTRAMUSCULAR | Status: DC | PRN
  Administered 2016-06-27: 50 ug via INTRAVENOUS

## 2016-06-27 MED ORDER — DEXAMETHASONE SODIUM PHOSPHATE 10 MG/ML IJ SOLN
INTRAMUSCULAR | Status: DC | PRN
  Administered 2016-06-27: 4 mg via INTRAVENOUS

## 2016-06-27 MED ORDER — ROCURONIUM BROMIDE 100 MG/10ML IV SOLN
INTRAVENOUS | Status: AC
  Filled 2016-06-27: qty 1

## 2016-06-27 MED ORDER — ONDANSETRON HCL 4 MG/2ML IJ SOLN
INTRAMUSCULAR | Status: DC | PRN
  Administered 2016-06-27: 4 mg via INTRAVENOUS

## 2016-06-27 MED ORDER — DEXAMETHASONE SODIUM PHOSPHATE 4 MG/ML IJ SOLN
INTRAMUSCULAR | Status: AC
Start: 2016-06-27 — End: 2016-06-27
  Filled 2016-06-27: qty 1

## 2016-06-27 MED ORDER — ENOXAPARIN SODIUM 40 MG/0.4ML ~~LOC~~ SOLN
40.0000 mg | SUBCUTANEOUS | Status: AC
  Administered 2016-06-27: 40 mg via SUBCUTANEOUS
  Filled 2016-06-27: qty 0.4

## 2016-06-27 MED ORDER — HYDROCODONE-ACETAMINOPHEN 5-325 MG PO TABS: ORAL_TABLET | ORAL | 0 refills | Status: DC

## 2016-06-27 MED ORDER — LIDOCAINE HCL (CARDIAC) 20 MG/ML IV SOLN
INTRAVENOUS | Status: DC | PRN
  Administered 2016-06-27: 100 mg via INTRAVENOUS

## 2016-06-27 MED ORDER — SCOPOLAMINE 1 MG/3DAYS TD PT72
1.0000 | MEDICATED_PATCH | Freq: Once | TRANSDERMAL | Status: DC
  Administered 2016-06-27: 1.5 mg via TRANSDERMAL

## 2016-06-27 MED ORDER — SUCCINYLCHOLINE CHLORIDE 20 MG/ML IJ SOLN
INTRAMUSCULAR | Status: DC | PRN
  Administered 2016-06-27: 140 mg via INTRAVENOUS

## 2016-06-27 MED ORDER — LIDOCAINE HCL (CARDIAC) 20 MG/ML IV SOLN
INTRAVENOUS | Status: AC
Start: 2016-06-27 — End: 2016-06-27
  Filled 2016-06-27: qty 5

## 2016-06-27 MED ORDER — PROPOFOL 10 MG/ML IV BOLUS
INTRAVENOUS | Status: AC
  Filled 2016-06-27: qty 20

## 2016-06-27 MED ORDER — SCOPOLAMINE 1 MG/3DAYS TD PT72
MEDICATED_PATCH | TRANSDERMAL | Status: AC
  Administered 2016-06-27: 1.5 mg via TRANSDERMAL
  Filled 2016-06-27: qty 1

## 2016-06-27 MED ORDER — PROMETHAZINE HCL 25 MG/ML IJ SOLN: 6.2500 mg | INTRAMUSCULAR | Status: DC | PRN

## 2016-06-27 MED ORDER — LACTATED RINGERS IV SOLN
INTRAVENOUS | Status: DC
  Administered 2016-06-27: 12:00:00 via INTRAVENOUS

## 2016-06-27 MED ORDER — ONDANSETRON HCL 4 MG/2ML IJ SOLN
INTRAMUSCULAR | Status: AC
  Filled 2016-06-27: qty 2

## 2016-06-27 MED ORDER — LIDOCAINE HCL 1 % IJ SOLN
INTRAMUSCULAR | Status: AC
  Filled 2016-06-27: qty 20

## 2016-06-27 MED ORDER — FENTANYL CITRATE (PF) 250 MCG/5ML IJ SOLN
INTRAMUSCULAR | Status: AC
  Filled 2016-06-27: qty 5

## 2016-06-27 MED ORDER — PROPOFOL 10 MG/ML IV BOLUS
INTRAVENOUS | Status: DC | PRN
  Administered 2016-06-27: 200 mg via INTRAVENOUS
  Administered 2016-06-27: 50 mg via INTRAVENOUS

## 2016-06-27 MED ORDER — EPHEDRINE SULFATE 50 MG/ML IJ SOLN
INTRAMUSCULAR | Status: DC | PRN
  Administered 2016-06-27 (×2): 5 mg via INTRAVENOUS

## 2016-06-27 MED ORDER — MIDAZOLAM HCL 2 MG/2ML IJ SOLN
INTRAMUSCULAR | Status: AC
  Filled 2016-06-27: qty 2

## 2016-06-27 MED ORDER — HYDROMORPHONE HCL 1 MG/ML IJ SOLN: 0.2500 mg | INTRAMUSCULAR | Status: DC | PRN

## 2016-06-27 SURGICAL SUPPLY — 22 items
ABLATOR ENDOMETRIAL BIPOLAR (ABLATOR) IMPLANT
CANISTER SUCT 3000ML (MISCELLANEOUS) ×2 IMPLANT
CATH ROBINSON RED A/P 16FR (CATHETERS) ×2 IMPLANT
CLOTH BEACON ORANGE TIMEOUT ST (SAFETY) ×2 IMPLANT
CONTAINER PREFILL 10% NBF 60ML (FORM) ×4 IMPLANT
DILATOR CANAL MILEX (MISCELLANEOUS) ×2 IMPLANT
ELECT REM PT RETURN 9FT ADLT (ELECTROSURGICAL)
ELECTRODE REM PT RTRN 9FT ADLT (ELECTROSURGICAL) IMPLANT
GLOVE BIO SURGEON STRL SZ7 (GLOVE) ×2 IMPLANT
GLOVE BIOGEL PI IND STRL 7.0 (GLOVE) ×1 IMPLANT
GLOVE BIOGEL PI INDICATOR 7.0 (GLOVE) ×1
GOWN STRL REUS W/TWL LRG LVL3 (GOWN DISPOSABLE) ×4 IMPLANT
PACK TRENDGUARD 600 HYBRD PROC (MISCELLANEOUS) ×1 IMPLANT
PACK VAGINAL MINOR WOMEN LF (CUSTOM PROCEDURE TRAY) ×2 IMPLANT
PACK WEDGE PROC TRENDGRD 600 (MISCELLANEOUS) ×1 IMPLANT
PAD OB MATERNITY 4.3X12.25 (PERSONAL CARE ITEMS) ×2 IMPLANT
TOWEL OR 17X24 6PK STRL BLUE (TOWEL DISPOSABLE) ×4 IMPLANT
TRENDGRD 600 WEDGE PROC PACK (MISCELLANEOUS) ×2
TRENDGUARD 600 HYBRID PROC PK (MISCELLANEOUS) ×2
TUBING AQUILEX INFLOW (TUBING) ×2 IMPLANT
TUBING AQUILEX OUTFLOW (TUBING) ×2 IMPLANT
WATER STERILE IRR 1000ML POUR (IV SOLUTION) ×2 IMPLANT

## 2016-06-27 NOTE — H&P (Signed)
Stacey Wise is an 60 y.o. female.   60 yo G51 female presents for Hysteroscopy, D&C for evaluation of postmenopausal bleeding. Pt presented for evaluation of PMB Late September. [name] in the office was limited due to the patient's habitus, however the endometrium measured 1.8 cm. Given the thickness of the endometrium in the context of morbid obesity and diabetes mellitus it was recommended to the patient to undergo a hysteroscopy, dilation and curettage for more in depth evaluation of the endometrial cavity. R/B/A reviewed and the patient wishes to proceed. The patient has a h/o of Dysfuntional uterine bleeding in the perimenopausal period and had been on progestin therapy from 2009 to 2015.    Patient's last menstrual period was 06/23/2016.    Past Medical History:  Diagnosis Date  . Arthritis   . Complication of anesthesia    hard to wake up from parathyroidectomy  . DIABETES MELLITUS, TYPE I 02/28/2007  . DIABETIC PERIPHERAL NEUROPATHY 02/28/2007  . DYSLIPIDEMIA 02/28/2007  . Family history of anesthesia complication    sister hard to wake up  . Heel spur, right   . Hepatitis    Hep B  . HYPERTENSION 02/28/2007  . Morbid obesity ([place]) 02/28/2007  . Pneumonia   . SLEEP APNEA 02/28/2007   does not use a cpap-could not use  . Uterine fibroid     Past Surgical History:  Procedure Laterality Date  . BREAST LUMPECTOMY WITH NEEDLE LOCALIZATION Left 01/01/2013   Procedure: LEFT BREAST LUMPECTOMY WITH NEEDLE LOCALIZATION;  Surgeon: Marcello Moores A. Cornett, MD;  Location: [place];  Service: General;  Laterality: Left;  . CERVICAL BIOPSY  W/ LOOP ELECTRODE EXCISION    . DILATION AND CURETTAGE OF UTERUS  2009  . ELECTROCARDIOGRAM  11/19/2004  . PARATHYROIDECTOMY Left 06/30/2015   Procedure: LEFT PARATHYROIDECTOMY;  Surgeon: Armandina Gemma, MD;  Location: [facility];  Service: General;  Laterality: Left;  . TUBAL LIGATION  1996    Family History  Problem Relation Age of Onset  . Diabetes  Mother   . Arthritis Mother   . Hypertension Mother   . Arthritis Father   . Cancer Father     Prostate  . Dementia Father   . Alzheimer's disease Father   . Cancer Sister     Lymphoma  . Arthritis Sister   . Arthritis Brother   . Cancer Sister     Peritoneal  . Arthritis Sister   . Arthritis Sister   . Arthritis Sister   . Arthritis Brother   . Diabetes Other     Sibling  . Diabetes Other     Sibling    Social History:  reports that she has never smoked. She has never used smokeless tobacco. She reports that she does not drink alcohol or use drugs.  Allergies: No Known Allergies  Prescriptions Prior to Admission  Medication Sig Dispense Refill Last Dose  . aspirin 81 MG tablet Take 81 mg by mouth daily.   Past Month at Unknown time  . atorvastatin (LIPITOR) 20 MG tablet Take 20 mg by mouth daily.   06/26/2016 at Unknown time  . diltiazem (TIAZAC) 300 MG 24 hr capsule TAKE ONE CAPSULE EVERY DAY 90 capsule 3 06/27/2016 at Unknown time  . hydrALAZINE (APRESOLINE) 25 MG tablet Take 25 mg by mouth 2 (two) times daily.  0 06/27/2016 at Unknown time  . losartan (COZAAR) 100 MG tablet Take 100 mg by mouth daily.   06/27/2016 at Unknown time  . metFORMIN (GLUCOPHAGE-XR)  500 MG 24 hr tablet Take 1,500 mg by mouth daily.   4 06/26/2016 at Unknown time  . NOVOLIN 70/30 (70-30) 100 UNIT/ML injection INJECT 110 UNITS UNDER THE SKIN WITH BREAKFAST AND 30 UNITS WITH THE EVENING MEAL. (Patient taking differently: INJECT 80 UNITS UNDER THE SKIN WITH BREAKFAST AND 40 UNITS WITH THE EVENING MEAL.) 40 mL 0 06/26/2016 at Unknown time  . torsemide (DEMADEX) 10 MG tablet Take 1 tablet (10 mg total) by mouth daily. **PT NEEDS APPT FOR FURTHER REFILLS** 30 tablet 1 06/27/2016 at Unknown time  . atorvastatin (LIPITOR) 10 MG tablet TAKE 1 TABLET (10 MG TOTAL) BY MOUTH DAILY. (Patient not taking: Reported on 06/10/2015) 90 tablet 2   . atorvastatin (LIPITOR) 10 MG tablet TAKE 1 TABLET (10 MG TOTAL) BY  MOUTH DAILY. (Patient not taking: Reported on 06/15/2016) 90 tablet 0 Not Taking at Unknown time  . HYDROcodone-acetaminophen (NORCO/VICODIN) 5-325 MG tablet Take 1-2 tablets by mouth every 4 (four) hours as needed for moderate pain. (Patient not taking: Reported on 06/15/2016) 20 tablet 0 Not Taking at Unknown time  . ibuprofen (ADVIL,MOTRIN) 200 MG tablet Take 200 mg by mouth every 6 (six) hours as needed for moderate pain.   Unknown at Unknown time  . lisinopril (PRINIVIL,ZESTRIL) 40 MG tablet TAKE 1 TABLET (40 MG TOTAL) BY MOUTH DAILY. (Patient not taking: Reported on 06/15/2016) 30 tablet 2 Not Taking at Unknown time    ROS  Last menstrual period 06/23/2016. Physical Exam   AOX3, NAD Abd obese, soft/non-tender Normal work of breathing  No results found for this or any previous visit (from the past 24 hour(s)).  No results found.  Assessment/Plan: 1) Proceed with hysteroscopy, dilation and curettage for evaluation of postmenopausal bleeding. R/B/A reviewed and consent obtained 2) Lovenox 40mg  [place] x 1 for DVT prophylaxis & SCDs  Stacey Levier H. 06/27/2016, 10:45 AM

## 2016-06-27 NOTE — Transfer of Care (Signed)
Immediate Anesthesia Transfer of Care Note  Patient: Stacey Wise  Procedure(s) Performed: Procedure(s): HYSTEROSCOPY (N/A) CERVICAL POLYPECTOMY (N/A) DILATATION AND CURETTAGE (N/A)  Patient Location: PACU  Anesthesia Type:General  Level of Consciousness: awake, alert  and oriented  Airway & Oxygen Therapy: Patient Spontanous Breathing and Patient connected to nasal cannula oxygen  Post-op Assessment: Report given to RN, Post -op Vital signs reviewed and stable and Patient moving all extremities  Post vital signs: Reviewed and stable  Last Vitals:  Vitals:   06/27/16 1048  BP: (!) 159/71  Pulse: 89  Resp: 14  Temp: 36.6 C    Last Pain:  Vitals:   06/27/16 1048  TempSrc: Oral      Patients Stated Pain Goal: 3 ([id number])  Complications: No apparent anesthesia complications

## 2016-06-27 NOTE — Anesthesia Preprocedure Evaluation (Addendum)
Anesthesia Evaluation  Patient identified by MRN, date of birth, ID band Patient awake    Reviewed: Allergy & Precautions, NPO status , Patient's Chart, lab work & pertinent test results  History of Anesthesia Complications Negative for: history of anesthetic complications  Airway Mallampati: II  TM Distance: >3 FB Neck ROM: Full    Dental no notable dental hx. (+) Dental Advisory Given   Pulmonary sleep apnea ,    Pulmonary exam normal        Cardiovascular hypertension, Pt. on medications Normal cardiovascular exam     Neuro/Psych negative neurological ROS     GI/Hepatic negative GI ROS, (+) Hepatitis -, B  Endo/Other  diabetes, Insulin Dependent, Oral Hypoglycemic AgentsMorbid obesity  Renal/GU      Musculoskeletal   Abdominal   Peds  Hematology negative hematology ROS (+)   Anesthesia Other Findings   Reproductive/Obstetrics                           Anesthesia Physical Anesthesia Plan  ASA: III  Anesthesia Plan: General   Post-op Pain Management:    Induction: Intravenous and Rapid sequence  Airway Management Planned: Oral ETT  Additional Equipment:   Intra-op Plan:   Post-operative Plan: Extubation in OR  Informed Consent: I have reviewed the patients History and Physical, chart, labs and discussed the procedure including the risks, benefits and alternatives for the proposed anesthesia with the patient or authorized representative who has indicated his/her understanding and acceptance.   Dental advisory given  Plan Discussed with: CRNA and Anesthesiologist  Anesthesia Plan Comments:         Anesthesia Quick Evaluation

## 2016-06-27 NOTE — Discharge Instructions (Addendum)

## 2016-06-27 NOTE — Op Note (Signed)
Pre-Operative Diagnosis: 1) Postmenopausal bleeding Postoperative Diagnosis: 1) Same Procedure: Hysteroscopy, dilation and curettage Surgeon: Dr. Vanessa Kick Assistant: None Anesthesia:General Operative Findings: Pelvic exam limited by habitus. Cervix flush with vagina, difficult to visualized external os. Located with os finder. Polypoid tissue prolapsing into cervix upon entry into cervical canal. Within the endometrial cavity there is fluffy, frond-like tissue that appears to be emanating from the fundal portion of the endometrial cavity. Upon curettage this tissue appears brittle and falls apart. The hysteroscope was re-introduced 3 times and frond-like tissue persisted. Despite multiple curettage attempts the cavity could not be completely evacuated. The decision was made to discontinue further curettage attempts as it was felt that an adequate tissue sample was collected for diagnosis. Specimen: None EBL: Minimal  Stacey Wise is a 60 year old nulligravida female who presents for surgical evaluation for postmenopausal bleeding. Please see the patient's admission history and physical for complete details of her history. Following the appropriate informed consent the patient was taken to the operating room where general anesthesia was administered. She was placed in the dorsal lithotomy position in [place]. The perineum and the vagina were prepped and draped in the normal sterile fashion. A speculum was placed into the vagina, and a single-tooth tenaculum was used to grasp the posterior lip of the cervix. Once the external loss was located with an office finding dilator, the cervix was serially dilated with Hank dilators. The hysteroscope was then introduced for the above findings. The hysteroscope was removed and a sharp curettage was performed. The hysteroscope was reintroduced 2 additional times, and 2 additional sharp curettages were performed. In spite of several efforts, the endometrial  cavity could not be completely evacuated. However, it was felt that an adequate tissue sample was obtained for diagnosis. Therefore, the decision was made to discontinue further curettage efforts. The speculum was removed from the vagina after the single-tooth tenaculum was removed from the cervix. A superficial perineal abrasion was noted at 6:00 from the position of the speculum. All sponge, lap, needle counts were correct 3. Anesthesia was reversed, the patient was extubated, and transferred to the PACU in stable condition following the procedure.

## 2016-06-27 NOTE — Anesthesia Procedure Notes (Signed)
Procedure Name: Intubation Date/Time: 06/27/2016 12:30 PM Performed by: Hewitt Blade Pre-anesthesia Checklist: Patient identified, Patient being monitored, Emergency Drugs available and Suction available Patient Re-evaluated:Patient Re-evaluated prior to inductionOxygen Delivery Method: Circle system utilized Preoxygenation: Pre-oxygenation with 100% oxygen Intubation Type: IV induction Ventilation: Mask ventilation without difficulty Laryngoscope Size: Mac and 3 Grade View: Grade I Tube type: Oral Tube size: 7.0 mm Number of attempts: 1 Airway Equipment and Method: Stylet Placement Confirmation: ETT inserted through vocal cords under direct vision,  positive ETCO2 and breath sounds checked- equal and bilateral Secured at: 22 cm Tube secured with: Tape Dental Injury: Teeth and Oropharynx as per pre-operative assessment

## 2016-06-27 NOTE — Anesthesia Preprocedure Evaluation (Signed)

## 2016-06-27 NOTE — Anesthesia Procedure Notes (Signed)
Procedure Name: Intubation Date/Time: 06/27/2016 12:30 PM Performed by: Hewitt Blade Pre-anesthesia Checklist: Patient identified, Emergency Drugs available, Suction available, Timeout performed and Patient being monitored Patient Re-evaluated:Patient Re-evaluated prior to inductionOxygen Delivery Method: Circle system utilized and Ambu bag Preoxygenation: Pre-oxygenation with 100% oxygen Intubation Type: IV induction Ventilation: Mask ventilation without difficulty Laryngoscope Size: Mac and 3 Grade View: Grade I Tube type: Oral Tube size: 7.0 mm Number of attempts: 1 Airway Equipment and Method: Patient positioned with wedge pillow and Stylet Placement Confirmation: ETT inserted through vocal cords under direct vision,  positive ETCO2 and breath sounds checked- equal and bilateral Secured at: 22 cm Tube secured with: Tape

## 2016-06-28 ENCOUNTER — Encounter (HOSPITAL_COMMUNITY): Payer: Self-pay | Admitting: Obstetrics and Gynecology

## 2016-06-28 NOTE — Anesthesia Postprocedure Evaluation (Addendum)
Anesthesia Post Note  Patient: Stacey Wise  Procedure(s) Performed: Procedure(s) (LRB): HYSTEROSCOPY (N/A) CERVICAL POLYPECTOMY (N/A) DILATATION AND CURETTAGE (N/A)  Patient location during evaluation: PACU Anesthesia Type: General Level of consciousness: sedated Pain management: pain level controlled Vital Signs Assessment: post-procedure vital signs reviewed and stable Respiratory status: spontaneous breathing and respiratory function stable Cardiovascular status: stable Anesthetic complications: no     Last Vitals:  Vitals:   06/27/16 1500 06/27/16 1615  BP: 140/72 (!) 151/78  Pulse: 87 86  Resp: 16 20  Temp: 36.5 C 36.6 C    Last Pain:  Vitals:   06/27/16 1615  TempSrc:   PainSc: 0-No pain   Pain Goal: Patients Stated Pain Goal: 3 (06/27/16 1500)               [place]

## 2016-06-29 LAB — GLUCOSE, CAPILLARY: Glucose-Capillary: 154 mg/dL — ABNORMAL HIGH (ref 65–99)

## 2016-06-30 DIAGNOSIS — M71571 Other bursitis, not elsewhere classified, right ankle and foot: Secondary | ICD-10-CM | POA: Diagnosis not present

## 2016-06-30 DIAGNOSIS — M722 Plantar fascial fibromatosis: Secondary | ICD-10-CM | POA: Diagnosis not present

## 2016-07-07 ENCOUNTER — Other Ambulatory Visit: Payer: Self-pay | Admitting: Endocrinology

## 2016-07-15 ENCOUNTER — Encounter: Payer: Self-pay | Admitting: Gynecology

## 2016-07-15 ENCOUNTER — Ambulatory Visit: Payer: BLUE CROSS/BLUE SHIELD | Attending: Gynecology | Admitting: Gynecology

## 2016-07-15 VITALS — BP 155/79 | HR 80 | Temp 97.6°F | Resp 18 | Ht 64.0 in | Wt 337.7 lb

## 2016-07-15 DIAGNOSIS — Z807 Family history of other malignant neoplasms of lymphoid, hematopoietic and related tissues: Secondary | ICD-10-CM | POA: Insufficient documentation

## 2016-07-15 DIAGNOSIS — Z794 Long term (current) use of insulin: Secondary | ICD-10-CM | POA: Insufficient documentation

## 2016-07-15 DIAGNOSIS — Z8249 Family history of ischemic heart disease and other diseases of the circulatory system: Secondary | ICD-10-CM | POA: Diagnosis not present

## 2016-07-15 DIAGNOSIS — E1142 Type 2 diabetes mellitus with diabetic polyneuropathy: Secondary | ICD-10-CM | POA: Diagnosis not present

## 2016-07-15 DIAGNOSIS — E785 Hyperlipidemia, unspecified: Secondary | ICD-10-CM | POA: Insufficient documentation

## 2016-07-15 DIAGNOSIS — Z833 Family history of diabetes mellitus: Secondary | ICD-10-CM | POA: Insufficient documentation

## 2016-07-15 DIAGNOSIS — G4730 Sleep apnea, unspecified: Secondary | ICD-10-CM | POA: Diagnosis not present

## 2016-07-15 DIAGNOSIS — E119 Type 2 diabetes mellitus without complications: Secondary | ICD-10-CM | POA: Diagnosis not present

## 2016-07-15 DIAGNOSIS — Z7982 Long term (current) use of aspirin: Secondary | ICD-10-CM | POA: Diagnosis not present

## 2016-07-15 DIAGNOSIS — Z808 Family history of malignant neoplasm of other organs or systems: Secondary | ICD-10-CM | POA: Insufficient documentation

## 2016-07-15 DIAGNOSIS — Z8042 Family history of malignant neoplasm of prostate: Secondary | ICD-10-CM | POA: Insufficient documentation

## 2016-07-15 DIAGNOSIS — Z8261 Family history of arthritis: Secondary | ICD-10-CM | POA: Diagnosis not present

## 2016-07-15 DIAGNOSIS — Z8619 Personal history of other infectious and parasitic diseases: Secondary | ICD-10-CM | POA: Diagnosis not present

## 2016-07-15 DIAGNOSIS — Z6841 Body Mass Index (BMI) 40.0 and over, adult: Secondary | ICD-10-CM | POA: Insufficient documentation

## 2016-07-15 DIAGNOSIS — I10 Essential (primary) hypertension: Secondary | ICD-10-CM | POA: Insufficient documentation

## 2016-07-15 DIAGNOSIS — M1990 Unspecified osteoarthritis, unspecified site: Secondary | ICD-10-CM | POA: Diagnosis not present

## 2016-07-15 DIAGNOSIS — C541 Malignant neoplasm of endometrium: Secondary | ICD-10-CM | POA: Diagnosis not present

## 2016-07-15 NOTE — Patient Instructions (Signed)
Plan to follow up with Dr. Alan Ripper office for IUD placement then follow up with Dr. Fermin Schwab in three months time for a repeat biopsy.  Please call for any questions or concerns.  Levonorgestrel intrauterine device (IUD) What is this medicine? LEVONORGESTREL IUD (LEE voe nor jes trel) is a contraceptive (birth control) device and used to treat endometrial cancer as well by releasing hormones to the lining of the uterus. The device is placed inside the uterus by a healthcare professional. It is used to treat endometrial cancer. This device can also be used to treat heavy bleeding that occurs during your period. This medicine may be used for other purposes; ask your health care provider or pharmacist if you have questions. COMMON BRAND NAME(S): Minette Headland What should I tell my health care provider before I take this medicine? They need to know if you have any of these conditions: -abnormal Pap smear -cancer of the breast, uterus, or cervix -diabetes -endometritis -genital or pelvic infection now or in the past -have more than one sexual partner or your partner has more than one partner -heart disease -history of an ectopic or tubal pregnancy -immune system problems -IUD in place -liver disease or tumor -problems with blood clots or take blood-thinners -seizures -use intravenous drugs -uterus of unusual shape -vaginal bleeding that has not been explained -an unusual or allergic reaction to levonorgestrel, other hormones, silicone, or polyethylene, medicines, foods, dyes, or preservatives -pregnant or trying to get pregnant -breast-feeding How should I use this medicine? This device is placed inside the uterus by a health care professional. Talk to your pediatrician regarding the use of this medicine in children. Special care may be needed. Overdosage: If you think you have taken too much of this medicine contact a poison control center or emergency room at  once. NOTE: This medicine is only for you. Do not share this medicine with others. What if I miss a dose? This does not apply. Depending on the brand of device you have inserted, the device will need to be replaced every 3 to 5 years if you wish to continue using this type of birth control. What may interact with this medicine? Do not take this medicine with any of the following medications: -amprenavir -bosentan -fosamprenavir This medicine may also interact with the following medications: -aprepitant -barbiturate medicines for inducing sleep or treating seizures -bexarotene -griseofulvin -medicines to treat seizures like carbamazepine, ethotoin, felbamate, oxcarbazepine, phenytoin, topiramate -modafinil -pioglitazone -rifabutin -rifampin -rifapentine -some medicines to treat HIV infection like atazanavir, indinavir, lopinavir, nelfinavir, tipranavir, ritonavir -St. John's wort -warfarin This list may not describe all possible interactions. Give your health care provider a list of all the medicines, herbs, non-prescription drugs, or dietary supplements you use. Also tell them if you smoke, drink alcohol, or use illegal drugs. Some items may interact with your medicine. What should I watch for while using this medicine? Visit your doctor or health care professional for regular check ups. See your doctor if you or your partner has sexual contact with others, becomes HIV positive, or gets a sexual transmitted disease. This product does not protect you against HIV infection (AIDS) or other sexually transmitted diseases. You can check the placement of the IUD yourself by reaching up to the top of your vagina with clean fingers to feel the threads. Do not pull on the threads. It is a good habit to check placement after each menstrual period. Call your doctor right away if you feel more of the IUD  than just the threads or if you cannot feel the threads at all. The IUD may come out by itself.  You may become pregnant if the device comes out. If you notice that the IUD has come out use a backup birth control method like condoms and call your health care provider. Using tampons will not change the position of the IUD and are okay to use during your period. This IUD can be safely scanned with magnetic resonance imaging (MRI) only under specific conditions. Before you have an MRI, tell your healthcare provider that you have an IUD in place, and which type of IUD you have in place. What side effects may I notice from receiving this medicine? Side effects that you should report to your doctor or health care professional as soon as possible: -allergic reactions like skin rash, itching or hives, swelling of the face, lips, or tongue -fever, flu-like symptoms -genital sores -high blood pressure -no menstrual period for 6 weeks during use -pain, swelling, warmth in the leg -pelvic pain or tenderness -severe or sudden headache -signs of pregnancy -stomach cramping -sudden shortness of breath -trouble with balance, talking, or walking -unusual vaginal bleeding, discharge -yellowing of the eyes or skin Side effects that usually do not require medical attention (report to your doctor or health care professional if they continue or are bothersome): -acne -breast pain -change in sex drive or performance -changes in weight -cramping, dizziness, or faintness while the device is being inserted -headache -irregular menstrual bleeding within first 3 to 6 months of use -nausea This list may not describe all possible side effects. Call your doctor for medical advice about side effects. You may report side effects to FDA at 1-800-FDA-1088. Where should I keep my medicine? This does not apply. NOTE: This sheet is a summary. It may not cover all possible information. If you have questions about this medicine, talk to your doctor, pharmacist, or health care provider.  2017 Elsevier/Gold Standard  (2016-02-05 13:46:37)

## 2016-07-15 NOTE — Progress Notes (Signed)
Consult Note: Gyn-Onc   Stacey Wise 60 y.o. female  Chief Complaint  Patient presents with  . endometrial cancer    New Consultation    Assessment :Grade 1 endometrial adenocarcinoma. Morbid obesity and other comorbid medical conditions including diabetes hypertension and hypercalcemia.  Plan: The natural history of endometrial cancer and treatment options were discussed with the patient and her sister. They understand that hysterectomy is the usual standard of care. However, given her comorbid conditions including a BMI of 58, I recommend treatment with a Mirena IUD. I discussed this with Dr. Harrington Challenger and she is willing to place the Mirena. I discussed this with the patient and her sister and they're in agreement with this management plan. I will plan on seeing her back in late February and attempt to perform an endometrial biopsy. The patient's aware that it may require an exam under anesthesia to be able to fully evaluate her and perform an endometrial biopsy. All her questions are answered.  HPI: 60 year old Afro-American female seen in consultation request of Dr. Vanessa Kick regarding management of a newly diagnosed endometrial cancer. The patient's had a long history of irregular bleeding which is gradually become heavier. Patient underwent hysteroscopy and endometrial biopsy showing a grade 1 endometrial adenocarcinoma. Subsequently the patient's had minimal bleeding. Ultrasound showing the patient has fibroids.  Patient denies any past gynecologic history. She has never been pregnant.  Patient has number: Morbid conditions including a BMI of 58, hypertension, diabetes, and hypercalcemia.  Review of Systems:10 point review of systems is negative except as noted in interval history.   Vitals: Blood pressure (!) 155/79, pulse 80, temperature 97.6 F (36.4 C), temperature source Oral, resp. rate 18, height 5\' 4"  (1.626 m), weight (!) 337 lb 11.2 oz (153.2 kg), last menstrual period  06/23/2016, SpO2 100 %.  Physical Exam: General : The patient is a healthy woman in no acute distress.  HEENT: normocephalic, extraoccular movements normal; neck is supple without thyromegally  Lynphnodes: Supraclavicular and inguinal nodes not enlarged  Abdomen: Morbidly obese, Soft, non-tender, no ascites, no organomegally, no masses, no hernias  Pelvic:  EGBUS: Normal female  Vagina: Normal, no lesions  Urethra and Bladder: Normal, non-tender  Cervix: Am unable to visualize the cervix Uterus: Cannot be assessed given the patient's habitus. Bi-manual examination: Non-tender; no adenxal masses or nodularity  Rectal: normal sphincter tone, no masses, no blood  Lower extremities: No edema or varicosities. Normal range of motion      No Known Allergies  Past Medical History:  Diagnosis Date  . Arthritis   . Complication of anesthesia    hard to wake up from parathyroidectomy  . DIABETES MELLITUS, TYPE I 02/28/2007  . DIABETIC PERIPHERAL NEUROPATHY 02/28/2007  . DYSLIPIDEMIA 02/28/2007  . Family history of anesthesia complication    sister hard to wake up  . Heel spur, right   . Hepatitis    Hep B  . HYPERTENSION 02/28/2007  . Morbid obesity ([place]) 02/28/2007  . Pneumonia   . SLEEP APNEA 02/28/2007   does not use a cpap-could not use  . Uterine fibroid     Past Surgical History:  Procedure Laterality Date  . BREAST LUMPECTOMY WITH NEEDLE LOCALIZATION Left 01/01/2013   Procedure: LEFT BREAST LUMPECTOMY WITH NEEDLE LOCALIZATION;  Surgeon: Marcello Moores A. Cornett, MD;  Location: [facility];  Service: General;  Laterality: Left;  . CERVICAL BIOPSY  W/ LOOP ELECTRODE EXCISION    . CERVICAL POLYPECTOMY N/A 06/27/2016   Procedure: CERVICAL POLYPECTOMY;  Surgeon: Vanessa Kick, MD;  Location: [facility];  Service: Gynecology;  Laterality: N/A;  . DILATION AND CURETTAGE OF UTERUS  2009  . DILATION AND CURETTAGE OF UTERUS N/A 06/27/2016   Procedure: DILATATION AND CURETTAGE;  Surgeon:  Vanessa Kick, MD;  Location: [facility];  Service: Gynecology;  Laterality: N/A;  . ELECTROCARDIOGRAM  11/19/2004  . HYSTEROSCOPY N/A 06/27/2016   Procedure: HYSTEROSCOPY;  Surgeon: Vanessa Kick, MD;  Location: [facility];  Service: Gynecology;  Laterality: N/A;  . PARATHYROIDECTOMY Left 06/30/2015   Procedure: LEFT PARATHYROIDECTOMY;  Surgeon: Armandina Gemma, MD;  Location: [facility];  Service: General;  Laterality: Left;  . TUBAL LIGATION  1996    Current Outpatient Prescriptions  Medication Sig Dispense Refill  . atorvastatin (LIPITOR) 20 MG tablet Take 20 mg by mouth daily.    [name] diltiazem (TIAZAC) 300 MG 24 hr capsule TAKE ONE CAPSULE EVERY DAY 90 capsule 3  . hydrALAZINE (APRESOLINE) 25 MG tablet Take 25 mg by mouth 2 (two) times daily.  0  . losartan (COZAAR) 100 MG tablet Take 100 mg by mouth daily.    . metFORMIN (GLUCOPHAGE-XR) 500 MG 24 hr tablet Take 1,500 mg by mouth daily.   4  . NOVOLIN 70/30 (70-30) 100 UNIT/ML injection INJECT 110 UNITS UNDER THE SKIN WITH BREAKFAST AND 30 UNITS WITH THE EVENING MEAL. (Patient taking differently: INJECT 80 UNITS UNDER THE SKIN WITH BREAKFAST AND 40 UNITS WITH THE EVENING MEAL.) 40 mL 0  . torsemide (DEMADEX) 10 MG tablet Take 1 tablet (10 mg total) by mouth daily. **PT NEEDS APPT FOR FURTHER REFILLS** 30 tablet 1  . aspirin 81 MG tablet Take 81 mg by mouth daily.    [name] ibuprofen (ADVIL,MOTRIN) 200 MG tablet Take 200 mg by mouth every 6 (six) hours as needed for moderate pain.     No current facility-administered medications for this visit.     Social History   Social History  . Marital status: Single    Spouse name: N/A  . Number of children: N/A  . 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 Smoking status: Never Smoker  . Smokeless tobacco: Never Used  . Alcohol use No  . Drug use: No  . Sexual activity: Not on file   Other Topics Concern   . Not on file   Social History Narrative  . No narrative on file    Family History  Problem Relation Age of Onset  . Diabetes Mother   . Arthritis Mother   . Hypertension Mother   . Arthritis Father   . Cancer Father     Prostate  . Dementia Father   . Alzheimer's disease Father   . Cancer Sister     Lymphoma  . Arthritis Sister   . Arthritis Brother   . Cancer Sister     Peritoneal  . Arthritis Sister   . Arthritis Sister   . Arthritis Sister   . Arthritis Brother   . Diabetes Other     Sibling  . Diabetes Other     Sibling      Marti Sleigh, MD 07/15/2016, 12:37 PM

## 2016-07-27 ENCOUNTER — Other Ambulatory Visit: Payer: Self-pay | Admitting: Endocrinology

## 2016-08-11 DIAGNOSIS — Z30430 Encounter for insertion of intrauterine contraceptive device: Secondary | ICD-10-CM | POA: Diagnosis not present

## 2016-08-11 DIAGNOSIS — C541 Malignant neoplasm of endometrium: Secondary | ICD-10-CM | POA: Diagnosis not present

## 2016-08-11 DIAGNOSIS — Z6841 Body Mass Index (BMI) 40.0 and over, adult: Secondary | ICD-10-CM | POA: Diagnosis not present

## 2016-08-11 DIAGNOSIS — Z30431 Encounter for routine checking of intrauterine contraceptive device: Secondary | ICD-10-CM | POA: Diagnosis not present

## 2016-09-13 DIAGNOSIS — I10 Essential (primary) hypertension: Secondary | ICD-10-CM | POA: Diagnosis not present

## 2016-09-13 DIAGNOSIS — E785 Hyperlipidemia, unspecified: Secondary | ICD-10-CM | POA: Diagnosis not present

## 2016-09-13 DIAGNOSIS — E119 Type 2 diabetes mellitus without complications: Secondary | ICD-10-CM | POA: Diagnosis not present

## 2016-09-13 DIAGNOSIS — E1165 Type 2 diabetes mellitus with hyperglycemia: Secondary | ICD-10-CM | POA: Diagnosis not present

## 2016-10-17 ENCOUNTER — Encounter: Payer: Self-pay | Admitting: Gynecology

## 2016-10-17 ENCOUNTER — Ambulatory Visit: Payer: BLUE CROSS/BLUE SHIELD | Attending: Gynecology | Admitting: Gynecology

## 2016-10-17 VITALS — BP 175/77 | HR 93 | Temp 98.0°F | Resp 18 | Ht 64.0 in | Wt 342.3 lb

## 2016-10-17 DIAGNOSIS — Z807 Family history of other malignant neoplasms of lymphoid, hematopoietic and related tissues: Secondary | ICD-10-CM | POA: Diagnosis not present

## 2016-10-17 DIAGNOSIS — Z975 Presence of (intrauterine) contraceptive device: Secondary | ICD-10-CM | POA: Diagnosis not present

## 2016-10-17 DIAGNOSIS — Z7982 Long term (current) use of aspirin: Secondary | ICD-10-CM | POA: Insufficient documentation

## 2016-10-17 DIAGNOSIS — E1142 Type 2 diabetes mellitus with diabetic polyneuropathy: Secondary | ICD-10-CM | POA: Insufficient documentation

## 2016-10-17 DIAGNOSIS — Z808 Family history of malignant neoplasm of other organs or systems: Secondary | ICD-10-CM | POA: Diagnosis not present

## 2016-10-17 DIAGNOSIS — Z9851 Tubal ligation status: Secondary | ICD-10-CM | POA: Diagnosis not present

## 2016-10-17 DIAGNOSIS — Z6841 Body Mass Index (BMI) 40.0 and over, adult: Secondary | ICD-10-CM | POA: Diagnosis not present

## 2016-10-17 DIAGNOSIS — E119 Type 2 diabetes mellitus without complications: Secondary | ICD-10-CM

## 2016-10-17 DIAGNOSIS — Z833 Family history of diabetes mellitus: Secondary | ICD-10-CM | POA: Diagnosis not present

## 2016-10-17 DIAGNOSIS — Z794 Long term (current) use of insulin: Secondary | ICD-10-CM | POA: Diagnosis not present

## 2016-10-17 DIAGNOSIS — G4730 Sleep apnea, unspecified: Secondary | ICD-10-CM | POA: Diagnosis not present

## 2016-10-17 DIAGNOSIS — Z8042 Family history of malignant neoplasm of prostate: Secondary | ICD-10-CM | POA: Insufficient documentation

## 2016-10-17 DIAGNOSIS — E890 Postprocedural hypothyroidism: Secondary | ICD-10-CM | POA: Diagnosis not present

## 2016-10-17 DIAGNOSIS — C541 Malignant neoplasm of endometrium: Secondary | ICD-10-CM | POA: Diagnosis not present

## 2016-10-17 DIAGNOSIS — E785 Hyperlipidemia, unspecified: Secondary | ICD-10-CM | POA: Diagnosis not present

## 2016-10-17 DIAGNOSIS — Z8261 Family history of arthritis: Secondary | ICD-10-CM | POA: Diagnosis not present

## 2016-10-17 DIAGNOSIS — I10 Essential (primary) hypertension: Secondary | ICD-10-CM | POA: Insufficient documentation

## 2016-10-17 NOTE — Progress Notes (Signed)
Consult Note: Gyn-Onc   Stacey Wise 61 y.o. female  Chief Complaint  Patient presents with  . Endometrial cancer    Assessment :Grade 1 endometrial adenocarcinoma. Morbid obesity and other comorbid medical conditions including diabetes hypertension and hypercalcemia. Currently asymptomatic with a Mirena IUD in place.  Plan:   I attempted an endometrial biopsy but was unable to pass the [place] into the cervix. The IUD string was visualized.  In lieu of performing a biopsy we will obtain a transvaginal ultrasound to assess endometrial thickness. Given that she is asymptomatic and if the endometrial stripe is thin we will continue to monitor the patient with ultrasound. On the other hand if they're still abnormally thickened endometrium we will need to schedule the patient to undergo an exam under anesthesia and endometrial biopsy or D&C so we can obtain adequate exposure.  Interval history: Following initial consultation the patient had a Mirena IUD placed. She's had no difficulty with the IUD. She denies any pelvic pain pressure or any vaginal bleeding.  HPI: 61 year old Afro-American female seen in consultation request of Dr. Vanessa Kick regarding management of a newly diagnosed endometrial cancer. The patient's had a long history of irregular bleeding which is gradually become heavier. Patient underwent hysteroscopy and endometrial biopsy showing a grade 1 endometrial adenocarcinoma. Subsequently the patient's had minimal bleeding. Ultrasound showing the patient has fibroids.  Patient denies any past gynecologic history. She has never been pregnant.  Patient has number of morbid conditions including a BMI of 58, hypertension, diabetes, and hypercalcemia.  Management options were considered and we decided to treat using a Mirena IUD which was placed in November 2017.  Review of Systems:10 point review of systems is negative except as noted in interval history.   Vitals: Blood  pressure (!) 175/77, pulse 93, temperature 98 F (36.7 C), temperature source Oral, resp. rate 18, height 5\' 4"  (1.626 m), weight (!) 342 lb 4.8 oz (155.3 kg), last menstrual period 06/23/2016, SpO2 98 %.  Physical Exam: General : The patient is a healthy woman in no acute distress.  HEENT: normocephalic, extraoccular movements normal; neck is supple without thyromegally  Lynphnodes: Supraclavicular and inguinal nodes not enlarged  Abdomen: Morbidly obese, Soft, non-tender, no ascites, no organomegally, no masses, no hernias  Pelvic:  EGBUS: Normal female  Vagina: Normal, no lesions  Urethra and Bladder: Normal, non-tender  Cervix: I'm unable to see the IUD string but unable to fully visualize the cervix despite the fact M using a very long speculum. Uterus: Cannot be assessed given the patient's habitus. Bi-manual examination: Non-tender; no adenxal masses or nodularity  Rectal: normal sphincter tone, no masses, no blood  Lower extremities: No edema or varicosities. Normal range of motion      No Known Allergies  Past Medical History:  Diagnosis Date  . Arthritis   . Complication of anesthesia    hard to wake up from parathyroidectomy  . DIABETES MELLITUS, TYPE I 02/28/2007  . DIABETIC PERIPHERAL NEUROPATHY 02/28/2007  . DYSLIPIDEMIA 02/28/2007  . Family history of anesthesia complication    sister hard to wake up  . Heel spur, right   . Hepatitis    Hep B  . HYPERTENSION 02/28/2007  . Morbid obesity ([place]) 02/28/2007  . Pneumonia   . SLEEP APNEA 02/28/2007   does not use a cpap-could not use  . Uterine fibroid     Past Surgical History:  Procedure Laterality Date  . BREAST LUMPECTOMY WITH NEEDLE LOCALIZATION Left 01/01/2013   Procedure: LEFT  BREAST LUMPECTOMY WITH NEEDLE LOCALIZATION;  Surgeon: Marcello Moores A. Cornett, MD;  Location: [facility];  Service: General;  Laterality: Left;  . CERVICAL BIOPSY  W/ LOOP ELECTRODE EXCISION    . CERVICAL POLYPECTOMY N/A 06/27/2016    Procedure: CERVICAL POLYPECTOMY;  Surgeon: Vanessa Kick, MD;  Location: [facility];  Service: Gynecology;  Laterality: N/A;  . DILATION AND CURETTAGE OF UTERUS  2009  . DILATION AND CURETTAGE OF UTERUS N/A 06/27/2016   Procedure: DILATATION AND CURETTAGE;  Surgeon: Vanessa Kick, MD;  Location: [facility];  Service: Gynecology;  Laterality: N/A;  . ELECTROCARDIOGRAM  11/19/2004  . HYSTEROSCOPY N/A 06/27/2016   Procedure: HYSTEROSCOPY;  Surgeon: Vanessa Kick, MD;  Location: [facility];  Service: Gynecology;  Laterality: N/A;  . PARATHYROIDECTOMY Left 06/30/2015   Procedure: LEFT PARATHYROIDECTOMY;  Surgeon: Armandina Gemma, MD;  Location: [facility];  Service: General;  Laterality: Left;  . TUBAL LIGATION  1996    Current Outpatient Prescriptions  Medication Sig Dispense Refill  . aspirin 81 MG tablet Take 81 mg by mouth daily.    [name] atorvastatin (LIPITOR) 20 MG tablet Take 20 mg by mouth daily.    [name] diltiazem (TIAZAC) 300 MG 24 hr capsule TAKE ONE CAPSULE EVERY DAY 90 capsule 3  . hydrALAZINE (APRESOLINE) 25 MG tablet Take 25 mg by mouth 2 (two) times daily.  0  . ibuprofen (ADVIL,MOTRIN) 200 MG tablet Take 200 mg by mouth every 6 (six) hours as needed for moderate pain.    [name] losartan (COZAAR) 100 MG tablet Take 100 mg by mouth daily.    . metFORMIN (GLUCOPHAGE-XR) 500 MG 24 hr tablet Take 1,500 mg by mouth daily.   4  . NOVOLIN 70/30 (70-30) 100 UNIT/ML injection INJECT 110 UNITS UNDER THE SKIN WITH BREAKFAST AND 30 UNITS WITH THE EVENING MEAL. (Patient taking differently: INJECT 80 UNITS UNDER THE SKIN WITH BREAKFAST AND 40 UNITS WITH THE EVENING MEAL.) 40 mL 0  . torsemide (DEMADEX) 10 MG tablet Take 1 tablet (10 mg total) by mouth daily. **PT NEEDS APPT FOR FURTHER REFILLS** 30 tablet 1   No current facility-administered medications for this visit.     Social History   Social History  . Marital status: Single    Spouse name: N/A  . Number of children: N/A  . Years of education: N/A   Occupational  History  . 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 Smoking status: Never Smoker  . Smokeless tobacco: Never Used  . Alcohol use No  . Drug use: No  . Sexual activity: Not on file   Other Topics Concern  . Not on file   Social History Narrative  . No narrative on file    Family History  Problem Relation Age of Onset  . Diabetes Mother   . Arthritis Mother   . Hypertension Mother   . Arthritis Father   . Cancer Father     Prostate  . Dementia Father   . Alzheimer's disease Father   . Cancer Sister     Lymphoma  . Arthritis Sister   . Arthritis Brother   . Cancer Sister     Peritoneal  . Arthritis Sister   . Arthritis Sister   . Arthritis Sister   . Arthritis Brother   . Diabetes Other     Sibling  . Diabetes Other     Sibling      Marti Sleigh, MD 10/17/2016, 10:45 AM

## 2016-10-17 NOTE — Patient Instructions (Signed)
Your transvaginal ultrasound appointment is Wednesday, 10/19/16 at 3pm at [facility]. Please arrive at 2:45pm, you will need to have a full bladder. If you need to change the appointment please call [phone].

## 2016-10-19 ENCOUNTER — Other Ambulatory Visit: Payer: Self-pay | Admitting: Gynecology

## 2016-10-19 ENCOUNTER — Ambulatory Visit (HOSPITAL_COMMUNITY): Payer: BLUE CROSS/BLUE SHIELD

## 2016-10-19 ENCOUNTER — Ambulatory Visit (HOSPITAL_COMMUNITY)
Admission: RE | Admit: 2016-10-19 | Discharge: 2016-10-19 | Disposition: A | Discharge status: Home / Self Care | Payer: BLUE CROSS/BLUE SHIELD | Source: Ambulatory Visit | Attending: Gynecology | Admitting: Gynecology

## 2016-10-19 DIAGNOSIS — C541 Malignant neoplasm of endometrium: Secondary | ICD-10-CM

## 2016-10-19 DIAGNOSIS — D259 Leiomyoma of uterus, unspecified: Secondary | ICD-10-CM | POA: Diagnosis not present

## 2016-10-19 DIAGNOSIS — E0821 Diabetes mellitus due to underlying condition with diabetic nephropathy: Secondary | ICD-10-CM

## 2016-10-20 DIAGNOSIS — M8580 Other specified disorders of bone density and structure, unspecified site: Secondary | ICD-10-CM | POA: Diagnosis not present

## 2016-10-20 DIAGNOSIS — E119 Type 2 diabetes mellitus without complications: Secondary | ICD-10-CM | POA: Diagnosis not present

## 2016-10-20 DIAGNOSIS — Z794 Long term (current) use of insulin: Secondary | ICD-10-CM | POA: Diagnosis not present

## 2016-11-01 DIAGNOSIS — Z794 Long term (current) use of insulin: Secondary | ICD-10-CM | POA: Diagnosis not present

## 2016-11-01 DIAGNOSIS — E119 Type 2 diabetes mellitus without complications: Secondary | ICD-10-CM | POA: Diagnosis not present

## 2016-11-08 DIAGNOSIS — E119 Type 2 diabetes mellitus without complications: Secondary | ICD-10-CM | POA: Diagnosis not present

## 2016-11-08 DIAGNOSIS — Z794 Long term (current) use of insulin: Secondary | ICD-10-CM | POA: Diagnosis not present

## 2016-11-15 DIAGNOSIS — E1165 Type 2 diabetes mellitus with hyperglycemia: Secondary | ICD-10-CM | POA: Diagnosis not present

## 2016-11-15 DIAGNOSIS — E119 Type 2 diabetes mellitus without complications: Secondary | ICD-10-CM | POA: Diagnosis not present

## 2016-11-15 DIAGNOSIS — M85851 Other specified disorders of bone density and structure, right thigh: Secondary | ICD-10-CM | POA: Diagnosis not present

## 2016-12-02 ENCOUNTER — Telehealth: Payer: Self-pay

## 2016-12-02 NOTE — Telephone Encounter (Signed)
Office note obtained from dr Harrington Challenger that documents that IUD was placed for this patient on 08/11/16. Pt evaluated by Dr Fermin Schwab on 10/17/16, unable to obtain endometrial biopsy, did see IUD string, IUD placement to be verified by ultrasound that was done 10/19/16 and results showed that pt did NOT have an IUD in place. RN discussed with patient she does not recall IUD coming out. RN discussed with Dr Fermin Schwab plan is for pt to have D&C and plan for IUD to be replaced. RN will discuss with patient.

## 2016-12-06 ENCOUNTER — Telehealth: Payer: Self-pay

## 2016-12-06 MED ORDER — LEVONORGESTREL 20 MCG/24HR IU IUD: 1.0000 | INTRAUTERINE_SYSTEM | Freq: Once | INTRAUTERINE | 0 refills | Status: DC

## 2016-12-06 NOTE — Telephone Encounter (Signed)
Stacey Wise stated that she cannot do the surgery on 12-13-16 as she needs to give her work a 3 week notice. [name] for 01-03-17 at [facility] Patient Surgery with Dr. Denman George at 1 pm.  The [place] IUD order sent to [facility] out patient pharmacy.  They will call the patient with ins/co pay information. The IUD needs Prior Authorization per pharmacy.

## 2016-12-13 NOTE — Telephone Encounter (Signed)
The Mirena IUD is not covered under Ms Patricelli' ins. The cost of the IUD at [facility] out patient pharmacy is $992.10.  Ms Decock cannot afford this payment.  She is still paying for last IUD procedure. Looked into assistance/discounts for Mirena IUD.  There is a Prescription savings voucher that can give up to 75% off the IUD.  [facility] not a current participent. [facility] is a participating pharmacy through [facility] speciality pharmacy.  [facility] phone:[phone]. Patient account # [id number]. It takes 24-49 hrs to process the coupon.  [facility] will contact patient with cost and confirm cost. The [facility] can be [facility] a local [facility] with in 48 hrs. Patient can pay for IUD at local pharmacy. Ms Mitnick will call the GYN clinic when she hears from [place].

## 2016-12-23 NOTE — Progress Notes (Signed)
REVIEWED PT CHART AND NOTED PT'S BMI 58.76 , WHICH IS OVER THE ANESTHESIA GUIDELINES OF BMI <50 ARE NOT CANDIDATES FOR AMBULATORY SURGERY CENTER.  CALLED AND LM VIA PHONE FOR DR ROSSI OFFICE , THAT PT WOULD NEED TO BE DONE AT MAIN OR.

## 2016-12-27 NOTE — Telephone Encounter (Signed)
The cost of the IUD through [place] with coupon is $872.42 with coupon. This cost is too high for the patient. Did call [facility] to report an adverse event as the [name] did not show that the IUD was in the uterus,but the strings were seen at office visit on 10-17-16. See phone note dated 12-02-16. Reached out to the Drug Rep for [place] in this region Deniece Portela [phone] to see if a replacement IUD is an option for this patient.

## 2016-12-27 NOTE — Telephone Encounter (Signed)
[name] NP. Spoke with Ms Steinhardt stating that The D&C can be done and hopefully the IUD can be preserved if not it would be removed if in place and she could take megace tablets.  Melissa reviewed side effects of  The tablets vs the IUD. Ms Pol elected to post pone the procedure to 01-19-17.  Pt given the phone number to the Pre service center to discuss the estamated cost of the [facility] as to determine her out of pocket cost.

## 2017-01-12 NOTE — Progress Notes (Signed)
EKG 06-15-16 epic

## 2017-01-12 NOTE — Patient Instructions (Addendum)
Stacey Wise  01/12/2017   Your procedure is scheduled on: 01-19-17  Report to [facility] Main  Entrance Take [facility]  elevators to 3rd floor to  [place] at 1130AM.   Call this number if you have problems the morning of surgery [phone]    Remember: ONLY 1 PERSON MAY GO WITH YOU TO SHORT STAY TO GET  READY MORNING OF YOUR SURGERY.  Do not eat food After Midnight. You may have clear liquids from midnight until 730am day of surgery. Nothing by mouth after 730am!!     Take these medicines the morning of surgery with A SIP OF WATER:  diltiazem(tiazac), hydralazine(apresoline)                               You may not have any metal on your body including hair pins and              piercings  Do not wear jewelry, make-up, lotions, powders or perfumes, deodorant             Do not wear nail polish.  Do not shave  48 hours prior to surgery.              Do not bring valuables to the hospital. [place].  Contacts, dentures or bridgework may not be worn into surgery.      Patients discharged the day of surgery will not be allowed to drive home.  Name and phone number of your driver:  Special Instructions: N/A              Please read over the following fact sheets you were given: _____________________________________________________________________   CLEAR LIQUID DIET   Foods Allowed                                                                     Foods Excluded  Coffee and tea, regular and decaf                             liquids that you cannot  Plain Jell-O in any flavor                                             see through such as: Fruit ices (not with fruit pulp)                                     milk, soups, orange juice  Iced Popsicles                                    All solid food Carbonated beverages, regular and diet  Cranberry, grape and  apple juices Sports drinks like Gatorade Lightly seasoned clear broth or consume(fat free) Sugar, honey syrup  Sample Menu Breakfast                                Lunch                                     Supper Cranberry juice                    Beef broth                            Chicken broth Jell-O                                     Grape juice                           Apple juice Coffee or tea                        Jell-O                                      Popsicle                                                Coffee or tea                        Coffee or tea  _____________________________________________________________________             How to Manage Your Diabetes Before and After Surgery  Why is it important to control my blood sugar before and after surgery? . Improving blood sugar levels before and after surgery helps healing and can limit problems. . A way of improving blood sugar control is eating a healthy diet by: o  Eating less sugar and carbohydrates o  Increasing activity/exercise o  Talking with your doctor about reaching your blood sugar goals . High blood sugars (greater than 180 mg/dL) can raise your risk of infections and slow your recovery, so you will need to focus on controlling your diabetes during the weeks before surgery. . Make sure that the doctor who takes care of your diabetes knows about your planned surgery including the date and location.  How do I manage my blood sugar before surgery? . Check your blood sugar at least 4 times a day, starting 2 days before surgery, to make sure that the level is not too high or low. o Check your blood sugar the morning of your surgery when you wake up and every 2 hours until you get to the Short Stay unit. . If your blood sugar is less than 70 mg/dL, you will need to treat for low blood sugar: o Do not take insulin. o Treat a low blood sugar (less than 70 mg/dL) with  cup of clear  juice (cranberry or  apple), 4 glucose tablets, OR glucose gel. o Recheck blood sugar in 15 minutes after treatment (to make sure it is greater than 70 mg/dL). If your blood sugar is not greater than 70 mg/dL on recheck, call [phone] for further instructions. . Report your blood sugar to the short stay nurse when you get to Short Stay.  . If you are admitted to the hospital after surgery: o Your blood sugar will be checked by the staff and you will probably be given insulin after surgery (instead of oral diabetes medicines) to make sure you have good blood sugar levels. o The goal for blood sugar control after surgery is 80-180 mg/dL.   WHAT DO I DO ABOUT MY DIABETES MEDICATION?  [name] Do not take oral diabetes medicines (pills) the morning of surgery.  . THE DAY BEFORE SURGERY 01-18-17   Take METFORMIN as usual   NOVOLIN 70/30: take only 63 units with breakfast. Take only 21 units with evening meal      . THE MORNING OF SURGERY 01-19-17   DO NOT TAKE ANY MEDICATION FOR DIABETES  Patient Signature:  Date:   Nurse Signature:  Date:   Reviewed and Endorsed by [place] Patient Education Committee, August 2015  [facility] - Preparing for Surgery Before surgery, you can play an important role.  Because skin is not sterile, your skin needs to be as free of germs as possible.  You can reduce the number of germs on your skin by washing with CHG (chlorahexidine gluconate) soap before surgery.  CHG is an antiseptic cleaner which kills germs and bonds with the skin to continue killing germs even after washing. Please DO NOT use if you have an allergy to CHG or antibacterial soaps.  If your skin becomes reddened/irritated stop using the CHG and inform your nurse when you arrive at Short Stay. Do not shave (including legs and underarms) for at least 48 hours prior to the first CHG shower.  You may shave your face/neck. Please follow these instructions carefully:  1.  Shower with CHG Soap the night before surgery  and the  morning of Surgery.  2.  If you choose to wash your hair, wash your hair first as usual with your  normal  shampoo.  3.  After you shampoo, rinse your hair and body thoroughly to remove the  shampoo.                           4.  Use CHG as you would any other liquid soap.  You can apply chg directly  to the skin and wash                       Gently with a scrungie or clean washcloth.  5.  Apply the CHG Soap to your body ONLY FROM THE NECK DOWN.   Do not use on face/ open                           Wound or open sores. Avoid contact with eyes, ears mouth and genitals (private parts).                       Wash face,  Genitals (private parts) with your normal soap.             6.  Wash thoroughly, paying special attention  to the area where your surgery  will be performed.  7.  Thoroughly rinse your body with warm water from the neck down.  8.  DO NOT shower/wash with your normal soap after using and rinsing off  the CHG Soap.                9.  Pat yourself dry with a clean towel.            10.  Wear clean pajamas.            11.  Place clean sheets on your bed the night of your first shower and do not  sleep with pets. Day of Surgery : Do not apply any lotions/deodorants the morning of surgery.  Please wear clean clothes to the hospital/surgery center.  FAILURE TO FOLLOW THESE INSTRUCTIONS MAY RESULT IN THE CANCELLATION OF YOUR SURGERY PATIENT SIGNATURE_________________________________  NURSE SIGNATURE__________________________________  ________________________________________________________________________   Adam Phenix  An incentive spirometer is a tool that can help keep your lungs clear and active. This tool measures how well you are filling your lungs with each breath. Taking long deep breaths may help reverse or decrease the chance of developing breathing (pulmonary) problems (especially infection) following:  A long period of time when you are unable to move or  be active. BEFORE THE PROCEDURE   If the spirometer includes an indicator to show your best effort, your nurse or respiratory therapist will set it to a desired goal.  If possible, sit up straight or lean slightly forward. Try not to slouch.  Hold the incentive spirometer in an upright position. INSTRUCTIONS FOR USE  1. Sit on the edge of your bed if possible, or sit up as far as you can in bed or on a chair. 2. Hold the incentive spirometer in an upright position. 3. Breathe out normally. 4. Place the mouthpiece in your mouth and seal your lips tightly around it. 5. Breathe in slowly and as deeply as possible, raising the piston or the ball toward the top of the column. 6. Hold your breath for 3-5 seconds or for as long as possible. Allow the piston or ball to fall to the bottom of the column. 7. Remove the mouthpiece from your mouth and breathe out normally. 8. Rest for a few seconds and repeat Steps 1 through 7 at least 10 times every 1-2 hours when you are awake. Take your time and take a few normal breaths between deep breaths. 9. The spirometer may include an indicator to show your best effort. Use the indicator as a goal to work toward during each repetition. 10. After each set of 10 deep breaths, practice coughing to be sure your lungs are clear. If you have an incision (the cut made at the time of surgery), support your incision when coughing by placing a pillow or rolled up towels firmly against it. Once you are able to get out of bed, walk around indoors and cough well. You may stop using the incentive spirometer when instructed by your caregiver.  RISKS AND COMPLICATIONS  Take your time so you do not get dizzy or light-headed.  If you are in pain, you may need to take or ask for pain medication before doing incentive spirometry. It is harder to take a deep breath if you are having pain. AFTER USE  Rest and breathe slowly and easily.  It can be helpful to keep track of a log  of your progress. Your caregiver can provide you  with a simple table to help with this. If you are using the spirometer at home, follow these instructions: [place] IF:   You are having difficultly using the spirometer.  You have trouble using the spirometer as often as instructed.  Your pain medication is not giving enough relief while using the spirometer.  You develop fever of 100.5 F (38.1 C) or higher. SEEK IMMEDIATE MEDICAL CARE IF:   You cough up bloody sputum that had not been present before.  You develop fever of 102 F (38.9 C) or greater.  You develop worsening pain at or near the incision site. MAKE SURE YOU:   Understand these instructions.  Will watch your condition.  Will get help right away if you are not doing well or get worse. Document Released: 12/26/2006 Document Revised: 11/07/2011 Document Reviewed: 02/26/2007 [facility] Patient Information 2014 [place], [place].   ________________________________________________________________________

## 2017-01-13 ENCOUNTER — Encounter (HOSPITAL_COMMUNITY): Payer: Self-pay

## 2017-01-13 ENCOUNTER — Encounter (HOSPITAL_COMMUNITY)
Admission: RE | Admit: 2017-01-13 | Discharge: 2017-01-13 | Disposition: A | Discharge status: Home / Self Care | Payer: BLUE CROSS/BLUE SHIELD | Source: Ambulatory Visit | Attending: Gynecologic Oncology | Admitting: Gynecologic Oncology

## 2017-01-13 DIAGNOSIS — C541 Malignant neoplasm of endometrium: Secondary | ICD-10-CM | POA: Insufficient documentation

## 2017-01-13 DIAGNOSIS — Z01812 Encounter for preprocedural laboratory examination: Secondary | ICD-10-CM | POA: Diagnosis not present

## 2017-01-13 HISTORY — DX: Trigger finger, left ring finger: M65.342

## 2017-01-13 HISTORY — DX: Type 2 diabetes mellitus without complications: E11.9

## 2017-01-13 LAB — CBC WITH DIFFERENTIAL/PLATELET
Basophils Absolute: 0 10*3/uL (ref 0.0–0.1)
Basophils Relative: 0 %
Eosinophils Absolute: 0.5 10*3/uL (ref 0.0–0.7)
Eosinophils Relative: 3 %
HCT: 40.3 % (ref 36.0–46.0)
Hemoglobin: 12.8 g/dL (ref 12.0–15.0)
Lymphocytes Relative: 18 %
Lymphs Abs: 2.6 10*3/uL (ref 0.7–4.0)
MCH: 26.3 pg (ref 26.0–34.0)
MCHC: 31.8 g/dL (ref 30.0–36.0)
MCV: 82.9 fL (ref 78.0–100.0)
Monocytes Absolute: 0.9 10*3/uL (ref 0.1–1.0)
Monocytes Relative: 6 %
Neutro Abs: 10.7 10*3/uL — ABNORMAL HIGH (ref 1.7–7.7)
Neutrophils Relative %: 73 %
Platelets: 315 10*3/uL (ref 150–400)
RBC: 4.86 MIL/uL (ref 3.87–5.11)
RDW: 15 % (ref 11.5–15.5)
WBC: 14.7 10*3/uL — ABNORMAL HIGH (ref 4.0–10.5)

## 2017-01-13 LAB — COMPREHENSIVE METABOLIC PANEL
ALT: 16 U/L (ref 14–54)
AST: 19 U/L (ref 15–41)
Albumin: 3.8 g/dL (ref 3.5–5.0)
Alkaline Phosphatase: 108 U/L (ref 38–126)
Anion gap: 10 (ref 5–15)
BUN: 21 mg/dL — ABNORMAL HIGH (ref 6–20)
CO2: 27 mmol/L (ref 22–32)
Calcium: 9.9 mg/dL (ref 8.9–10.3)
Chloride: 104 mmol/L (ref 101–111)
Creatinine, Ser: 1 mg/dL (ref 0.44–1.00)
GFR calc Af Amer: >60 mL/min (ref >60)
GFR calc non Af Amer: 60 mL/min — ABNORMAL LOW (ref >60)
Glucose, Bld: 165 mg/dL — ABNORMAL HIGH (ref 65–99)
Potassium: 3.8 mmol/L (ref 3.5–5.1)
Sodium: 141 mmol/L (ref 135–145)
Total Bilirubin: 0.4 mg/dL (ref 0.3–1.2)
Total Protein: 7.5 g/dL (ref 6.5–8.1)

## 2017-01-13 LAB — GLUCOSE, CAPILLARY: Glucose-Capillary: 165 mg/dL — ABNORMAL HIGH (ref 65–99)

## 2017-01-13 NOTE — Progress Notes (Signed)
CBCdiff results routed via epic to Dr Everitt Amber

## 2017-01-14 LAB — HEMOGLOBIN A1C
Hgb A1c MFr Bld: 8.5 % — ABNORMAL HIGH (ref 4.8–5.6)
Mean Plasma Glucose: 197 mg/dL

## 2017-01-17 NOTE — Progress Notes (Signed)
HGA1C RESULT ROUTED VIA EPIC TO DR Denman George

## 2017-01-19 ENCOUNTER — Encounter (HOSPITAL_COMMUNITY)
Admission: RE | Disposition: A | Discharge status: Home / Self Care | Payer: Self-pay | Source: Ambulatory Visit | Attending: Gynecologic Oncology

## 2017-01-19 ENCOUNTER — Ambulatory Visit (HOSPITAL_COMMUNITY): Payer: BLUE CROSS/BLUE SHIELD | Admitting: Anesthesiology

## 2017-01-19 ENCOUNTER — Ambulatory Visit (HOSPITAL_BASED_OUTPATIENT_CLINIC_OR_DEPARTMENT_OTHER)
Admission: RE | Admit: 2017-01-19 | Discharge: 2017-01-19 | Disposition: A | Discharge status: Home / Self Care | Payer: BLUE CROSS/BLUE SHIELD | Source: Ambulatory Visit | Attending: Gynecologic Oncology | Admitting: Gynecologic Oncology

## 2017-01-19 ENCOUNTER — Telehealth: Payer: Self-pay | Admitting: *Deleted

## 2017-01-19 ENCOUNTER — Encounter (HOSPITAL_COMMUNITY): Payer: Self-pay | Admitting: *Deleted

## 2017-01-19 DIAGNOSIS — E785 Hyperlipidemia, unspecified: Secondary | ICD-10-CM | POA: Diagnosis not present

## 2017-01-19 DIAGNOSIS — E1142 Type 2 diabetes mellitus with diabetic polyneuropathy: Secondary | ICD-10-CM | POA: Diagnosis not present

## 2017-01-19 DIAGNOSIS — Z975 Presence of (intrauterine) contraceptive device: Secondary | ICD-10-CM | POA: Diagnosis not present

## 2017-01-19 DIAGNOSIS — Z7982 Long term (current) use of aspirin: Secondary | ICD-10-CM | POA: Insufficient documentation

## 2017-01-19 DIAGNOSIS — Z808 Family history of malignant neoplasm of other organs or systems: Secondary | ICD-10-CM | POA: Diagnosis not present

## 2017-01-19 DIAGNOSIS — G4730 Sleep apnea, unspecified: Secondary | ICD-10-CM | POA: Insufficient documentation

## 2017-01-19 DIAGNOSIS — E1140 Type 2 diabetes mellitus with diabetic neuropathy, unspecified: Secondary | ICD-10-CM | POA: Diagnosis not present

## 2017-01-19 DIAGNOSIS — I10 Essential (primary) hypertension: Secondary | ICD-10-CM | POA: Diagnosis not present

## 2017-01-19 DIAGNOSIS — C541 Malignant neoplasm of endometrium: Secondary | ICD-10-CM | POA: Insufficient documentation

## 2017-01-19 DIAGNOSIS — Z79899 Other long term (current) drug therapy: Secondary | ICD-10-CM | POA: Insufficient documentation

## 2017-01-19 DIAGNOSIS — Z6841 Body Mass Index (BMI) 40.0 and over, adult: Secondary | ICD-10-CM | POA: Diagnosis not present

## 2017-01-19 DIAGNOSIS — Z794 Long term (current) use of insulin: Secondary | ICD-10-CM | POA: Diagnosis not present

## 2017-01-19 DIAGNOSIS — N950 Postmenopausal bleeding: Secondary | ICD-10-CM

## 2017-01-19 HISTORY — PX: DILATION AND CURETTAGE OF UTERUS: SHX78

## 2017-01-19 LAB — GLUCOSE, CAPILLARY
Glucose-Capillary: 127 mg/dL — ABNORMAL HIGH (ref 65–99)
Glucose-Capillary: 130 mg/dL — ABNORMAL HIGH (ref 65–99)

## 2017-01-19 SURGERY — DILATION AND CURETTAGE
Anesthesia: General

## 2017-01-19 MED ORDER — HYDROMORPHONE HCL 1 MG/ML IJ SOLN: 0.2500 mg | INTRAMUSCULAR | Status: DC | PRN

## 2017-01-19 MED ORDER — ROCURONIUM BROMIDE 10 MG/ML (PF) SYRINGE
PREFILLED_SYRINGE | INTRAVENOUS | Status: DC | PRN
  Administered 2017-01-19: 30 mg via INTRAVENOUS

## 2017-01-19 MED ORDER — FENTANYL CITRATE (PF) 100 MCG/2ML IJ SOLN
INTRAMUSCULAR | Status: AC
  Filled 2017-01-19: qty 2

## 2017-01-19 MED ORDER — MEPERIDINE HCL 50 MG/ML IJ SOLN: 6.2500 mg | INTRAMUSCULAR | Status: DC | PRN

## 2017-01-19 MED ORDER — ONDANSETRON HCL 4 MG/2ML IJ SOLN
INTRAMUSCULAR | Status: DC | PRN
  Administered 2017-01-19: 4 mg via INTRAVENOUS

## 2017-01-19 MED ORDER — LACTATED RINGERS IV SOLN
INTRAVENOUS | Status: DC
  Administered 2017-01-19: 13:00:00 via INTRAVENOUS

## 2017-01-19 MED ORDER — SUGAMMADEX SODIUM 200 MG/2ML IV SOLN
INTRAVENOUS | Status: DC | PRN
  Administered 2017-01-19: 500 mg via INTRAVENOUS

## 2017-01-19 MED ORDER — LIDOCAINE 2% (20 MG/ML) 5 ML SYRINGE
INTRAMUSCULAR | Status: DC | PRN
  Administered 2017-01-19: 100 mg via INTRAVENOUS

## 2017-01-19 MED ORDER — IBUPROFEN 800 MG PO TABS: 800.0000 mg | ORAL_TABLET | Freq: Three times a day (TID) | ORAL | 0 refills | Status: DC | PRN

## 2017-01-19 MED ORDER — 0.9 % SODIUM CHLORIDE (POUR BTL) OPTIME
TOPICAL | Status: DC | PRN
  Administered 2017-01-19: 1000 mL

## 2017-01-19 MED ORDER — OXYCODONE HCL 5 MG PO TABS: 5.0000 mg | ORAL_TABLET | Freq: Once | ORAL | Status: DC | PRN

## 2017-01-19 MED ORDER — FENTANYL CITRATE (PF) 100 MCG/2ML IJ SOLN
INTRAMUSCULAR | Status: DC | PRN
  Administered 2017-01-19 (×2): 50 ug via INTRAVENOUS

## 2017-01-19 MED ORDER — LIDOCAINE 2% (20 MG/ML) 5 ML SYRINGE
INTRAMUSCULAR | Status: AC
  Filled 2017-01-19: qty 5

## 2017-01-19 MED ORDER — OXYCODONE HCL 5 MG/5ML PO SOLN: 5.0000 mg | Freq: Once | ORAL | Status: DC | PRN

## 2017-01-19 MED ORDER — PROMETHAZINE HCL 25 MG/ML IJ SOLN: 6.2500 mg | INTRAMUSCULAR | Status: DC | PRN

## 2017-01-19 MED ORDER — SUCCINYLCHOLINE CHLORIDE 200 MG/10ML IV SOSY
PREFILLED_SYRINGE | INTRAVENOUS | Status: DC | PRN
  Administered 2017-01-19: 120 mg via INTRAVENOUS

## 2017-01-19 MED ORDER — PROPOFOL 10 MG/ML IV BOLUS
INTRAVENOUS | Status: AC
Start: 2017-01-19 — End: 2017-01-19
  Filled 2017-01-19: qty 20

## 2017-01-19 MED ORDER — PROPOFOL 10 MG/ML IV BOLUS
INTRAVENOUS | Status: AC
  Filled 2017-01-19: qty 20

## 2017-01-19 MED ORDER — PROPOFOL 10 MG/ML IV BOLUS
INTRAVENOUS | Status: DC | PRN
  Administered 2017-01-19: 200 mg via INTRAVENOUS

## 2017-01-19 SURGICAL SUPPLY — 16 items
CATH ROBINSON RED A/P 16FR (CATHETERS) ×2 IMPLANT
DRAPE SHEET LG 3/4 BI-LAMINATE (DRAPES) ×2 IMPLANT
DRSG TELFA 3X8 NADH (GAUZE/BANDAGES/DRESSINGS) ×2 IMPLANT
GAUZE SPONGE 4X4 16PLY XRAY LF (GAUZE/BANDAGES/DRESSINGS) ×2 IMPLANT
GLOVE BIO SURGEON STRL SZ 6 (GLOVE) ×4 IMPLANT
GOWN STRL REUS W/TWL LRG LVL3 (GOWN DISPOSABLE) ×4 IMPLANT
KIT BASIN OR (CUSTOM PROCEDURE TRAY) ×2 IMPLANT
LUBRICANT JELLY K Y 4OZ (MISCELLANEOUS) ×2 IMPLANT
NS IRRIG 1000ML POUR BTL (IV SOLUTION) IMPLANT
PACK LITHOTOMY IV (CUSTOM PROCEDURE TRAY) ×2 IMPLANT
PAD OB MATERNITY 4.3X12.25 (PERSONAL CARE ITEMS) ×2 IMPLANT
SUT VIC AB 0 CT1 27 (SUTURE) ×3
SUT VIC AB 0 CT1 27XBRD ANTBC (SUTURE) ×3 IMPLANT
TOWEL OR 17X26 10 PK STRL BLUE (TOWEL DISPOSABLE) ×2 IMPLANT
UNDERPAD 30X30 (UNDERPADS AND DIAPERS) ×4 IMPLANT
YANKAUER SUCT BULB TIP 10FT TU (MISCELLANEOUS) ×2 IMPLANT

## 2017-01-19 NOTE — Op Note (Signed)
PATIENT: [name] DATE: 01/19/17   Preop Diagnosis: grade 1 endometrial cancer  Postoperative Diagnosis: same  Surgery: dilation and curettage  Surgeons:  Donaciano Eva, MD Assistant: none  Anesthesia: General   Estimated blood loss: <88ml  IVF:  217ml   Urine output: 336 ml   Complications: None   Pathology: endometrial curettings   Operative findings: uterus sounded to 14cm. Moderate amount of tissue retrieved. IUD strings visible from cervical os (and were present at the completion of the procedure)  Procedure: The patient was identified in the preoperative holding area. Informed consent was signed on the chart. Patient was seen history was reviewed and exam was performed.   The patient was then taken to the operating room and placed in the supine position with SCD hose on. General anesthesia was then induced without difficulty. She was then placed in the dorsolithotomy position. The perineum was prepped with Betadine. The vagina was prepped with Betadine. The patient was then draped after the prep was dried. An in and out catheterization to empty the bladder was performed under sterile conditions.  Timeout was performed the patient, procedure, antibiotic, allergy, and length of procedure.   The speculum was placed in the posterior vagina. The single tooth tenaculum was used to grasp the anterior cervix. The IUD strings were seen.   The cervix was progressively dilated with pratts dilators to accomodate the curette. An endometrial pipelle catheter was used as the curette tool, so as not to disturb the IUD.   After 10 passes with the pipelle the procedure was completed. The IUD strings remained in place.  After removal of the tenaculum the sites were confirmed hemostatic.  The vagina was irrigated.  All instrument, suture, laparotomy, Ray-Tec, and needle counts were correct x2. The patient tolerated the procedure well and was taken recovery room in  stable condition. This is Everitt Amber dictating an operative note on Stacey Wise.

## 2017-01-19 NOTE — Discharge Instructions (Signed)
General Anesthesia, Adult, Care After These instructions provide you with information about caring for yourself after your procedure. Your health care provider may also give you more specific instructions. Your treatment has been planned according to current medical practices, but problems sometimes occur. Call your health care provider if you have any problems or questions after your procedure. What can I expect after the procedure? After the procedure, it is common to have:  Vomiting.  A sore throat.  Mental slowness. It is common to feel:  Nauseous.  Cold or shivery.  Sleepy.  Tired.  Sore or achy, even in parts of your body where you did not have surgery. Follow these instructions at home: For at least 24 hours after the procedure:   Do not:  Participate in activities where you could fall or become injured.  Drive.  Use heavy machinery.  Drink alcohol.  Take sleeping pills or medicines that cause drowsiness.  Make important decisions or sign legal documents.  Take care of children on your own.  Rest. Eating and drinking   If you vomit, drink water, juice, or soup when you can drink without vomiting.  Drink enough fluid to keep your urine clear or pale yellow.  Make sure you have little or no nausea before eating solid foods.  Follow the diet recommended by your health care provider. General instructions   Have a responsible adult stay with you until you are awake and alert.  Return to your normal activities as told by your health care provider. Ask your health care provider what activities are safe for you.  Take over-the-counter and prescription medicines only as told by your health care provider.  If you smoke, do not smoke without supervision.  Keep all follow-up visits as told by your health care provider. This is important. Contact a health care provider if:  You continue to have nausea or vomiting at home, and medicines are not helpful.  You  cannot drink fluids or start eating again.  You cannot urinate after 8-12 hours.  You develop a skin rash.  You have fever.  You have increasing redness at the site of your procedure. Get help right away if:  You have difficulty breathing.  You have chest pain.  You have unexpected bleeding.  You feel that you are having a life-threatening or urgent problem. This information is not intended to replace advice given to you by your health care provider. Make sure you discuss any questions you have with your health care provider. Document Released: 11/21/2000 Document Revised: 01/18/2016 Document Reviewed: 07/30/2015 Elsevier Interactive Patient Education  2017 [place].       [facility], Care After ACTIVITY  Rest as much as possible the first two days after discharge.  You do not have weight restrictions on lifting  Avoid strenuous working out such as running or lifting weights for 48-72 hours  You can climb stairs and drive a car NUTRITION  You may resume your normal diet.  Drink 6 to 8 glasses of fluids a day.  Eat a healthy, balanced diet including portions of food from the meat (protein), milk, fruit, vegetable, and bread groups.  Your caregiver may recommend you take a multivitamin with iron.  Medications:  - Take ibuprofen and tylenol first line for pain control. Take these regularly (every 6 hours) to decrease the build up of pain.  - you can increase your ibuprofen dose to up to 800mg  every 8 hours as needed for pain.  - do not take  more than 2400mg  ibuprofen or 4000mg  tylenon in 24 hours.  ELIMINATION   If constipation occurs, drink more liquids, and add more fruits, vegetables, and bran to your diet. You may take a mild laxative, such as Milk of Magnesia, Metamucil, or a stool softener such as Colace, with permission from your caregiver.  HYGIENE  You may shower and wash your hair.  Avoid tub baths for 4 weeks  Do not add any bath oils or  chemicals to your bath water, after you have permission to take baths.  Avoid placing anything in the vagina for 4 weeks.  It is normal to pass a brown-flecked discharge from the vagina for several weeks after your procedure.  HOME CARE INSTRUCTIONS   Take your temperature twice a day and record it, especially if you feel feverish or have chills.  Follow your caregiver's instructions about medicines, activity, and follow-up appointments after surgery.  Do not drink alcohol while taking pain medicine.  You may take over-the-counter medicine for pain, recommended by your caregiver.  If your pain is not relieved with medicine, call your caregiver.  Do not take aspirin because it can cause bleeding.  Do not douche or use tampons (use a nonperfumed sanitary pad).  Do not have sexual intercourse for 6 weeks postoperatively. Hugging, kissing, and playful sexual activity is fine with your caregiver's permission.  Take showers instead of baths, until your caregiver gives you permission to take baths.  You may take a mild medicine for constipation, recommended by your caregiver. Bran foods and drinking a lot of fluids will help with constipation.  Make sure your family understands everything about your operation and recovery.  SEEK MEDICAL CARE IF:    You notice a foul smell coming from the vagina.  You have painful or bloody urination.  You develop nausea and vomiting.  You develop diarrhea.  You develop a rash.  You have a reaction or allergy from the medicine.  You feel dizzy or light-headed.  You need stronger pain medicine.   SEEK IMMEDIATE MEDICAL CARE IF:   You develop a temperature of 102 F (38.9 C) or higher.  You pass out.  You develop leg or chest pain.  You develop abdominal pain.  You develop shortness of breath.  You bleed heavier than a period (soaking through 2 or more pads per hour for 2 hours in a row).  You see pus in the wound area.  MAKE  SURE YOU:   Understand these instructions.  Will watch your condition.  Will get help right away if you are not doing well or get worse. Document Released: 03/29/2004 Document Revised: 12/30/2013 Document Reviewed: 07/17/2009 [facility] Patient Information 2015 [place], [place]. This information is not intended to replace advice given to you by your health care provider. Make sure you discuss any questions you have with your health care provider.

## 2017-01-19 NOTE — H&P (Signed)
Stacey Wise 61 y.o. female     Chief Complaint  Patient presents with  . Endometrial cancer    Assessment :Grade 1 endometrial adenocarcinoma. Morbid obesity and other comorbid medical conditions including diabetes hypertension and hypercalcemia. Currently asymptomatic with a Mirena IUD in place. Unable to biopsy in the office.  Plan:  In lieu of performing a biopsy we will need to schedule the patient to undergo an exam under anesthesia and endometrial biopsy or D&C so we can obtain adequate exposure.    HPI: 61 year old Afro-American female seen in consultation request of Dr. Vanessa Kick regarding management of a newly diagnosed endometrial cancer. The patient's had a long history of irregular bleeding which is gradually become heavier. Patient underwent hysteroscopy and endometrial biopsy showing a grade 1 endometrial adenocarcinoma. Subsequently the patient's had minimal bleeding. Ultrasound showing the patient has fibroids.  Patient denies any past gynecologic history. She has never been pregnant.  Patient has number of morbid conditions including a BMI of 58, hypertension, diabetes, and hypercalcemia.  Management options were considered and we decided to treat using a Mirena IUD which was placed in November 2017.  Review of Systems:10 point review of systems is negative except as noted in interval history.   Vitals: Blood pressure (!) 175/77, pulse 93, temperature 98 F (36.7 C), temperature source Oral, resp. rate 18, height 5\' 4"  (1.626 m), weight (!) 342 lb 4.8 oz (155.3 kg), last menstrual period 06/23/2016, SpO2 98 %.  Physical Exam: General : The patient is a healthy woman in no acute distress.  HEENT: normocephalic, extraoccular movements normal; neck is supple without thyromegally  Lynphnodes: Supraclavicular and inguinal nodes not enlarged  Abdomen: Morbidly obese, Soft, non-tender, no ascites, no organomegally, no masses, no hernias  Pelvic:  EGBUS:  Normal female  Vagina: Normal, no lesions  Urethra and Bladder: Normal, non-tender  Cervix: I'm unable to see the IUD string but unable to fully visualize the cervix despite the fact M using a very long speculum. Uterus: Cannot be assessed given the patient's habitus. Bi-manual examination: Non-tender; no adenxal masses or nodularity  Rectal: normal sphincter tone, no masses, no blood  Lower extremities: No edema or varicosities. Normal range of motion      No Known Allergies      Past Medical History:  Diagnosis Date  . Arthritis   . Complication of anesthesia    hard to wake up from parathyroidectomy  . DIABETES MELLITUS, TYPE I 02/28/2007  . DIABETIC PERIPHERAL NEUROPATHY 02/28/2007  . DYSLIPIDEMIA 02/28/2007  . Family history of anesthesia complication    sister hard to wake up  . Heel spur, right   . Hepatitis    Hep B  . HYPERTENSION 02/28/2007  . Morbid obesity ([place]) 02/28/2007  . Pneumonia   . SLEEP APNEA 02/28/2007   does not use a cpap-could not use  . Uterine fibroid          Past Surgical History:  Procedure Laterality Date  . BREAST LUMPECTOMY WITH NEEDLE LOCALIZATION Left 01/01/2013   Procedure: LEFT BREAST LUMPECTOMY WITH NEEDLE LOCALIZATION;  Surgeon: Marcello Moores A. Cornett, MD;  Location: [place];  Service: General;  Laterality: Left;  . CERVICAL BIOPSY  W/ LOOP ELECTRODE EXCISION    . CERVICAL POLYPECTOMY N/A 06/27/2016   Procedure: CERVICAL POLYPECTOMY;  Surgeon: Vanessa Kick, MD;  Location: [facility];  Service: Gynecology;  Laterality: N/A;  . DILATION AND CURETTAGE OF UTERUS  2009  . DILATION AND CURETTAGE OF UTERUS N/A 06/27/2016  Procedure: DILATATION AND CURETTAGE;  Surgeon: Vanessa Kick, MD;  Location: [facility];  Service: Gynecology;  Laterality: N/A;  . ELECTROCARDIOGRAM  11/19/2004  . HYSTEROSCOPY N/A 06/27/2016   Procedure: HYSTEROSCOPY;  Surgeon: Vanessa Kick, MD;  Location: [facility];  Service: Gynecology;  Laterality: N/A;   . PARATHYROIDECTOMY Left 06/30/2015   Procedure: LEFT PARATHYROIDECTOMY;  Surgeon: Armandina Gemma, MD;  Location: [facility];  Service: General;  Laterality: Left;  . TUBAL LIGATION  1996          Current Outpatient Prescriptions  Medication Sig Dispense Refill  . aspirin 81 MG tablet Take 81 mg by mouth daily.    [name] atorvastatin (LIPITOR) 20 MG tablet Take 20 mg by mouth daily.    [name] diltiazem (TIAZAC) 300 MG 24 hr capsule TAKE ONE CAPSULE EVERY DAY 90 capsule 3  . hydrALAZINE (APRESOLINE) 25 MG tablet Take 25 mg by mouth 2 (two) times daily.  0  . ibuprofen (ADVIL,MOTRIN) 200 MG tablet Take 200 mg by mouth every 6 (six) hours as needed for moderate pain.    [name] losartan (COZAAR) 100 MG tablet Take 100 mg by mouth daily.    . metFORMIN (GLUCOPHAGE-XR) 500 MG 24 hr tablet Take 1,500 mg by mouth daily.   4  . NOVOLIN 70/30 (70-30) 100 UNIT/ML injection INJECT 110 UNITS UNDER THE SKIN WITH BREAKFAST AND 30 UNITS WITH THE EVENING MEAL. (Patient taking differently: INJECT 80 UNITS UNDER THE SKIN WITH BREAKFAST AND 40 UNITS WITH THE EVENING MEAL.) 40 mL 0  . torsemide (DEMADEX) 10 MG tablet Take 1 tablet (10 mg total) by mouth daily. **PT NEEDS APPT FOR FURTHER REFILLS** 30 tablet 1   No current facility-administered medications for this visit.     Social History        Social History  . Marital status: Single    Spouse name: N/A  . Number of children: N/A  . Years of education: N/A       Occupational History  . Quality Control for [employer]       Social History Main Topics  . Smoking status: Never Smoker  . Smokeless tobacco: Never Used  . Alcohol use No  . Drug use: No  . Sexual activity: Not on file       Other Topics Concern  . Not on file      Social History Narrative  . No narrative on file          Family History  Problem Relation Age of Onset  . Diabetes Mother   . Arthritis Mother   . Hypertension Mother    . Arthritis Father   . Cancer Father     Prostate  . Dementia Father   . Alzheimer's disease Father   . Cancer Sister     Lymphoma  . Arthritis Sister   . Arthritis Brother   . Cancer Sister     Peritoneal  . Arthritis Sister   . Arthritis Sister   . Arthritis Sister   . Arthritis Brother   . Diabetes Other     Sibling  . Diabetes Other     Sibling      Donaciano Eva, MD

## 2017-01-19 NOTE — Transfer of Care (Signed)
Immediate Anesthesia Transfer of Care Note  Patient: Stacey Wise  Procedure(s) Performed: Procedure(s): DILATATION AND CURETTAGE (N/A)  Patient Location: PACU  Anesthesia Type:General  Level of Consciousness:  sedated, patient cooperative and responds to stimulation  Airway & Oxygen Therapy:Patient Spontanous Breathing and Patient connected to face mask oxgen  Post-op Assessment:  Report given to PACU RN and Post -op Vital signs reviewed and stable  Post vital signs:  Reviewed and stable  Last Vitals:  Vitals:   01/19/17 1132  BP: (!) 144/68  Pulse: 91  Resp: 18  Temp: 44.5 C    Complications: No apparent anesthesia complications

## 2017-01-19 NOTE — Telephone Encounter (Signed)
Scheduled the patient for a post op appt, the appt will print on the discharge summary

## 2017-01-19 NOTE — Anesthesia Procedure Notes (Signed)
Procedure Name: Intubation Date/Time: 01/19/2017 1:12 PM Performed by: West Pugh Pre-anesthesia Checklist: Patient identified, Emergency Drugs available, Suction available, Patient being monitored and Timeout performed Patient Re-evaluated:Patient Re-evaluated prior to inductionOxygen Delivery Method: Circle system utilized Preoxygenation: Pre-oxygenation with 100% oxygen Intubation Type: IV induction Ventilation: Mask ventilation without difficulty Laryngoscope Size: Mac and 4 Grade View: Grade I Tube type: Oral Tube size: 7.5 mm Number of attempts: 1 Airway Equipment and Method: Stylet Placement Confirmation: ETT inserted through vocal cords under direct vision,  positive ETCO2,  CO2 detector and breath sounds checked- equal and bilateral Secured at: 22 cm Tube secured with: Tape Dental Injury: Teeth and Oropharynx as per pre-operative assessment

## 2017-01-19 NOTE — Anesthesia Postprocedure Evaluation (Signed)
Anesthesia Post Note  Patient: Stacey Wise  Procedure(s) Performed: Procedure(s) (LRB): DILATATION AND CURETTAGE (N/A)  Patient location during evaluation: PACU Anesthesia Type: General Level of consciousness: awake and alert Pain management: pain level controlled Vital Signs Assessment: post-procedure vital signs reviewed and stable Respiratory status: spontaneous breathing, nonlabored ventilation and respiratory function stable Cardiovascular status: blood pressure returned to baseline and stable Postop Assessment: no signs of nausea or vomiting Anesthetic complications: no       Last Vitals:  Vitals:   01/19/17 1415 01/19/17 1430  BP: (!) 163/79   Pulse: 87 83  Resp: 15 14  Temp:      Last Pain:  Vitals:   01/19/17 1430  TempSrc:   PainSc: 1                  Lynda Rainwater

## 2017-01-19 NOTE — Anesthesia Preprocedure Evaluation (Signed)
Anesthesia Evaluation  Patient identified by MRN, date of birth, ID band Patient awake    Reviewed: Allergy & Precautions, NPO status , Patient's Chart, lab work & pertinent test results  History of Anesthesia Complications Negative for: history of anesthetic complications  Airway Mallampati: II  TM Distance: >3 FB Neck ROM: Full    Dental no notable dental hx. (+) Dental Advisory Given   Pulmonary sleep apnea ,    Pulmonary exam normal        Cardiovascular hypertension, Pt. on medications Normal cardiovascular exam     Neuro/Psych negative neurological ROS     GI/Hepatic negative GI ROS, (+) Hepatitis -, B  Endo/Other  diabetes, Insulin Dependent, Oral Hypoglycemic AgentsMorbid obesity  Renal/GU      Musculoskeletal   Abdominal (+) + obese,   Peds  Hematology negative hematology ROS (+)   Anesthesia Other Findings   Reproductive/Obstetrics                             Anesthesia Physical  Anesthesia Plan  ASA: III  Anesthesia Plan: General   Post-op Pain Management:    Induction: Intravenous  Airway Management Planned: Oral ETT  Additional Equipment:   Intra-op Plan:   Post-operative Plan: Extubation in OR  Informed Consent: I have reviewed the patients History and Physical, chart, labs and discussed the procedure including the risks, benefits and alternatives for the proposed anesthesia with the patient or authorized representative who has indicated his/her understanding and acceptance.   Dental advisory given  Plan Discussed with: CRNA and Anesthesiologist  Anesthesia Plan Comments:         Anesthesia Quick Evaluation

## 2017-01-27 ENCOUNTER — Telehealth: Payer: Self-pay | Admitting: Gynecologic Oncology

## 2017-01-27 DIAGNOSIS — E119 Type 2 diabetes mellitus without complications: Secondary | ICD-10-CM | POA: Diagnosis not present

## 2017-01-27 DIAGNOSIS — Z794 Long term (current) use of insulin: Secondary | ICD-10-CM | POA: Diagnosis not present

## 2017-01-27 DIAGNOSIS — M8580 Other specified disorders of bone density and structure, unspecified site: Secondary | ICD-10-CM | POA: Diagnosis not present

## 2017-01-27 NOTE — Telephone Encounter (Signed)
Left message for the patient.  Returning her call about her biopsy.

## 2017-01-30 ENCOUNTER — Telehealth: Payer: Self-pay | Admitting: Gynecologic Oncology

## 2017-01-30 NOTE — Telephone Encounter (Signed)
Patient informed of biopsy results.  Advised she would be contacted with Dr. Serita Grit recommendations once available.  No concerns voiced.  Reporting that she is doing well.

## 2017-02-01 ENCOUNTER — Telehealth: Payer: Self-pay | Admitting: *Deleted

## 2017-02-01 NOTE — Telephone Encounter (Signed)
Contacted the patient and scheduled an appt for Monday June 11th. Patient appt at 11:30am ; arrive at 11:15am. Patient aware.

## 2017-02-06 ENCOUNTER — Encounter: Payer: Self-pay | Admitting: Gynecologic Oncology

## 2017-02-06 ENCOUNTER — Ambulatory Visit: Payer: BLUE CROSS/BLUE SHIELD | Attending: Gynecologic Oncology | Admitting: Gynecologic Oncology

## 2017-02-06 VITALS — BP 160/67 | HR 95 | Temp 98.5°F | Resp 20 | Wt 338.8 lb

## 2017-02-06 DIAGNOSIS — Z8249 Family history of ischemic heart disease and other diseases of the circulatory system: Secondary | ICD-10-CM | POA: Diagnosis not present

## 2017-02-06 DIAGNOSIS — Z807 Family history of other malignant neoplasms of lymphoid, hematopoietic and related tissues: Secondary | ICD-10-CM | POA: Diagnosis not present

## 2017-02-06 DIAGNOSIS — Z794 Long term (current) use of insulin: Secondary | ICD-10-CM | POA: Diagnosis not present

## 2017-02-06 DIAGNOSIS — E1142 Type 2 diabetes mellitus with diabetic polyneuropathy: Secondary | ICD-10-CM | POA: Diagnosis not present

## 2017-02-06 DIAGNOSIS — Z7982 Long term (current) use of aspirin: Secondary | ICD-10-CM | POA: Insufficient documentation

## 2017-02-06 DIAGNOSIS — I10 Essential (primary) hypertension: Secondary | ICD-10-CM | POA: Diagnosis not present

## 2017-02-06 DIAGNOSIS — G4730 Sleep apnea, unspecified: Secondary | ICD-10-CM | POA: Diagnosis not present

## 2017-02-06 DIAGNOSIS — M1990 Unspecified osteoarthritis, unspecified site: Secondary | ICD-10-CM | POA: Insufficient documentation

## 2017-02-06 DIAGNOSIS — E119 Type 2 diabetes mellitus without complications: Secondary | ICD-10-CM | POA: Diagnosis not present

## 2017-02-06 DIAGNOSIS — Z8042 Family history of malignant neoplasm of prostate: Secondary | ICD-10-CM | POA: Diagnosis not present

## 2017-02-06 DIAGNOSIS — Z6841 Body Mass Index (BMI) 40.0 and over, adult: Secondary | ICD-10-CM

## 2017-02-06 DIAGNOSIS — E785 Hyperlipidemia, unspecified: Secondary | ICD-10-CM | POA: Insufficient documentation

## 2017-02-06 DIAGNOSIS — Z79899 Other long term (current) drug therapy: Secondary | ICD-10-CM | POA: Diagnosis not present

## 2017-02-06 DIAGNOSIS — C541 Malignant neoplasm of endometrium: Secondary | ICD-10-CM | POA: Diagnosis not present

## 2017-02-06 DIAGNOSIS — Z833 Family history of diabetes mellitus: Secondary | ICD-10-CM | POA: Diagnosis not present

## 2017-02-06 DIAGNOSIS — Z975 Presence of (intrauterine) contraceptive device: Secondary | ICD-10-CM | POA: Diagnosis not present

## 2017-02-06 NOTE — Progress Notes (Signed)
Consult Note: Gyn-Onc   Stacey Wise 61 y.o. female  Chief Complaint  Patient presents with  . Endometrial Cancer    Assessment :Grade 1 endometrial adenocarcinoma. Morbid obesity and other comorbid medical conditions including diabetes hypertension and hypercalcemia. Currently asymptomatic with a Mirena IUD in place.  Plan:  I had extensive discussion with Stacey Wise about her condition. I discussed that grade1/2 endometrial cancer is rarely lethal. I discussed that it was a direct result of her morbid obesity. I discussed that her morbid obesity was preventing surgery, particularly because at her height, she would be unlikely to tolerate steep trendelenberg to accomplish surgery. I discussed that her large uterus (16cm) would require minilaparotomy for specimen delivery and this has a high risk of non-healing in a patient with poorly controlled diabetes and extreme abdominal adiposity.  Options for therapy would be 1/ continue progestins in a hope that it controls albeit not cures her disease. We could add oral progestins though this might stimulate her appetite. 2/ consider primary radiation - though this would likely subject her to significant toxicities. 3/ attempt weight loss followed by surgery. I discussed that a weight loss of 50 lbs would be very helpful in making her a better candidate for a deep pelvic procedure. I am recommending she see a diabetes nutritionist because her diabetes means that she will need to have appropriate carb intake to prevent hypoglycemia.  I will see her back in 1 month to re-evaluate weight loss efforts. We will recheck HbA1c at that time. If she is heading in the right direction, we will consider scheduling surgery (hysterectomy).   HPI: 61 year old Afro-American female seen in consultation request of Stacey Wise regarding management of a newly diagnosed endometrial cancer. The patient's had a long history of irregular bleeding which is gradually  become heavier. Patient underwent hysteroscopy and endometrial biopsy showing a grade 1 endometrial adenocarcinoma. Subsequently the patient's had minimal bleeding. Ultrasound showing the patient has fibroids.  Patient denies any past gynecologic history. She has never been pregnant.  Patient has number of morbid conditions including a BMI of 58, hypertension, diabetes, and hypercalcemia.  Management options were considered and we decided to treat using a Mirena IUD which was placed in November 2017.  Following initial consultation the patient had a Mirena IUD placed. She's had no difficulty with the IUD. She denies any pelvic pain pressure or any vaginal bleeding.  Interval history:  The patient underwent D&C on 01/19/17. Mirena IUD was kept in situ during the procedure.  The pathology confirmed persistent grade1-2 endometrial cancer. She has had minimal bleeding since.  Review of Systems:10 point review of systems is negative except as noted in interval history.   Vitals: Blood pressure (!) 160/67, pulse 95, temperature 98.5 F (36.9 C), temperature source Oral, resp. rate 20, weight (!) 338 lb 12.8 oz (153.7 kg), last menstrual period 06/23/2016.  Physical Exam: General : The patient is a healthy woman in no acute distress.  HEENT: normocephalic, extraoccular movements normal; neck is supple without thyromegally  Lynphnodes: Supraclavicular and inguinal nodes not enlarged  Abdomen: Morbidly obese, Soft, non-tender, no ascites, no organomegally, no masses, no hernias  Pelvic:  EGBUS: Normal female  Vagina: Normal, no lesions  Urethra and Bladder: Normal, non-tender  Cervix: I'm unable to see the IUD string but unable to fully visualize the cervix despite using a very long speculum. Uterus: Cannot be assessed given the patient's habitus. Bi-manual examination: Non-tender; no adenxal masses or nodularity  Rectal: normal sphincter  tone, no masses, no blood  Lower extremities: No edema  or varicosities. Normal range of motion      Allergies  Allergen Reactions  . No Known Allergies     Past Medical History:  Diagnosis Date  . Arthritis   . Complication of anesthesia    hard to wake up from parathyroidectomy  . Diabetes mellitus without complication ([place])    per patient " i am a type 2 diabetic"  . DIABETIC PERIPHERAL NEUROPATHY 02/28/2007  . DYSLIPIDEMIA 02/28/2007  . Family history of anesthesia complication    sister hard to wake up  . Heel spur, right   . Hepatitis    "states she has only been vaccinated but never had"  . HYPERTENSION 02/28/2007  . Morbid obesity ([place]) 02/28/2007  . Pneumonia    x2  . SLEEP APNEA 02/28/2007   does not use a cpap-could not use  . Trigger finger, left ring finger   . Uterine fibroid     Past Surgical History:  Procedure Laterality Date  . BREAST LUMPECTOMY WITH NEEDLE LOCALIZATION Left 01/01/2013   Procedure: LEFT BREAST LUMPECTOMY WITH NEEDLE LOCALIZATION;  Surgeon: Stacey Moores A. Cornett, MD;  Location: [place];  Service: General;  Laterality: Left;  . CERVICAL BIOPSY  W/ LOOP ELECTRODE EXCISION    . CERVICAL POLYPECTOMY N/A 06/27/2016   Procedure: CERVICAL POLYPECTOMY;  Surgeon: Stacey Kick, MD;  Location: [facility];  Service: Gynecology;  Laterality: N/A;  . DILATION AND CURETTAGE OF UTERUS  2009  . DILATION AND CURETTAGE OF UTERUS N/A 06/27/2016   Procedure: DILATATION AND CURETTAGE;  Surgeon: Stacey Kick, MD;  Location: [facility];  Service: Gynecology;  Laterality: N/A;  . DILATION AND CURETTAGE OF UTERUS N/A 01/19/2017   Procedure: DILATATION AND CURETTAGE;  Surgeon: Stacey Amber, MD;  Location: [facility];  Service: Gynecology;  Laterality: N/A;  . ELECTROCARDIOGRAM  11/19/2004  . HYSTEROSCOPY N/A 06/27/2016   Procedure: HYSTEROSCOPY;  Surgeon: Stacey Kick, MD;  Location: [facility];  Service: Gynecology;  Laterality: N/A;  . PARATHYROIDECTOMY Left 06/30/2015   Procedure: LEFT PARATHYROIDECTOMY;  Surgeon: Stacey Gemma, MD;   Location: [facility];  Service: General;  Laterality: Left;  . TUBAL LIGATION  1996    Current Outpatient Prescriptions  Medication Sig Dispense Refill  . aspirin 81 MG tablet Take 81 mg by mouth daily.    [name] atorvastatin (LIPITOR) 20 MG tablet Take 20 mg by mouth at bedtime.     [name] diltiazem (TIAZAC) 300 MG 24 hr capsule TAKE ONE CAPSULE EVERY DAY 90 capsule 3  . hydrALAZINE (APRESOLINE) 25 MG tablet Take 25 mg by mouth 2 (two) times daily.  0  . losartan (COZAAR) 100 MG tablet Take 100 mg by mouth daily.    . metFORMIN (GLUCOPHAGE-XR) 500 MG 24 hr tablet Take 1,500 mg by mouth daily.   4  . NOVOLIN 70/30 (70-30) 100 UNIT/ML injection INJECT 110 UNITS UNDER THE SKIN WITH BREAKFAST AND 30 UNITS WITH THE EVENING MEAL. (Patient taking differently: INJECT 90 UNITS UNDER THE SKIN WITH BREAKFAST AND 30 UNITS WITH THE EVENING MEAL.) 40 mL 0  . torsemide (DEMADEX) 10 MG tablet Take 1 tablet (10 mg total) by mouth daily. **PT NEEDS APPT FOR FURTHER REFILLS** 30 tablet 1  . levonorgestrel (MIRENA) 20 MCG/24HR IUD 1 Intra Uterine Device (1 each total) by Intrauterine route once. 1 each 0   No current facility-administered medications for this visit.     Social History   Social History  .  Marital status: Single    Spouse name: N/A  . Number of children: N/A  . 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 Smoking status: Never Smoker  . Smokeless tobacco: Never Used  . Alcohol use No  . Drug use: No  . Sexual activity: Not on file   Other Topics Concern  . Not on file   Social History Narrative  . No narrative on file    Family History  Problem Relation Age of Onset  . Diabetes Mother   . Arthritis Mother   . Hypertension Mother   . Arthritis Father   . Cancer Father        Prostate  . Dementia Father   . Alzheimer's disease Father   . Cancer Sister        Lymphoma  . Arthritis  Sister   . Arthritis Brother   . Cancer Sister        Peritoneal  . Arthritis Sister   . Arthritis Sister   . Arthritis Sister   . Arthritis Brother   . Diabetes Other        Sibling  . Diabetes Other        Sibling      Donaciano Eva, MD 02/06/2017, 4:49 PM

## 2017-02-06 NOTE — Patient Instructions (Signed)
Plan to follow up in one month or sooner if needed.  For information about Bariatric surgery, call [phone].  [facility] [phone]

## 2017-02-10 ENCOUNTER — Telehealth: Payer: Self-pay | Admitting: Gynecologic Oncology

## 2017-02-10 NOTE — Telephone Encounter (Signed)
Patient called stating she has not heard anything about meeting with a diabetes/weight loss counselor.  Informed her that the referral is in the computer and gave her the contact information for [facility] as well.  Advised her to call us back if she had not heard anything about her appt.  No concerns voiced.  Advised to call for any needs.

## 2017-02-20 ENCOUNTER — Ambulatory Visit: Payer: BLUE CROSS/BLUE SHIELD | Admitting: Gynecologic Oncology

## 2017-02-27 ENCOUNTER — Ambulatory Visit: Payer: BLUE CROSS/BLUE SHIELD | Admitting: Gynecologic Oncology

## 2017-03-08 ENCOUNTER — Encounter: Payer: BLUE CROSS/BLUE SHIELD | Attending: Internal Medicine | Admitting: *Deleted

## 2017-03-08 DIAGNOSIS — E669 Obesity, unspecified: Secondary | ICD-10-CM | POA: Insufficient documentation

## 2017-03-08 DIAGNOSIS — E119 Type 2 diabetes mellitus without complications: Secondary | ICD-10-CM | POA: Insufficient documentation

## 2017-03-08 DIAGNOSIS — Z713 Dietary counseling and surveillance: Secondary | ICD-10-CM | POA: Diagnosis not present

## 2017-03-08 DIAGNOSIS — E1142 Type 2 diabetes mellitus with diabetic polyneuropathy: Secondary | ICD-10-CM

## 2017-03-08 NOTE — Patient Instructions (Signed)
Plan:  Aim for 2 Carb Choices per meal (30 grams) +/- 1 either way  Aim for 0-1 Carbs per snack if hungry  Consider an evening snack of 1 Carb Choice and some protein to help prevent low BG during the night Include protein in moderation with your meals and snacks Consider reading food labels for Total Carbohydrate of foods Consider  increasing your activity level by doing some Arm Chair Exercises for 5 minutes several times each day and increase as tolerated Continue checking BG at alternate times per day  Continue  taking medication as directed by MD Notify Dr. Buddy Duty when BG starts dropping into the 80's for 2 or more days

## 2017-03-13 ENCOUNTER — Other Ambulatory Visit: Payer: BLUE CROSS/BLUE SHIELD

## 2017-03-13 ENCOUNTER — Ambulatory Visit: Payer: BLUE CROSS/BLUE SHIELD | Attending: Gynecologic Oncology | Admitting: Gynecologic Oncology

## 2017-03-13 ENCOUNTER — Encounter: Payer: Self-pay | Admitting: Gynecologic Oncology

## 2017-03-13 VITALS — BP 157/64 | HR 82 | Temp 98.4°F | Resp 20 | Wt 335.0 lb

## 2017-03-13 DIAGNOSIS — Z975 Presence of (intrauterine) contraceptive device: Secondary | ICD-10-CM

## 2017-03-13 DIAGNOSIS — Z6841 Body Mass Index (BMI) 40.0 and over, adult: Secondary | ICD-10-CM | POA: Diagnosis not present

## 2017-03-13 DIAGNOSIS — E1165 Type 2 diabetes mellitus with hyperglycemia: Secondary | ICD-10-CM | POA: Diagnosis not present

## 2017-03-13 DIAGNOSIS — Z79899 Other long term (current) drug therapy: Secondary | ICD-10-CM | POA: Insufficient documentation

## 2017-03-13 DIAGNOSIS — E119 Type 2 diabetes mellitus without complications: Secondary | ICD-10-CM

## 2017-03-13 DIAGNOSIS — C541 Malignant neoplasm of endometrium: Secondary | ICD-10-CM | POA: Diagnosis not present

## 2017-03-13 DIAGNOSIS — Z7982 Long term (current) use of aspirin: Secondary | ICD-10-CM | POA: Insufficient documentation

## 2017-03-13 DIAGNOSIS — I10 Essential (primary) hypertension: Secondary | ICD-10-CM | POA: Insufficient documentation

## 2017-03-13 DIAGNOSIS — Z794 Long term (current) use of insulin: Secondary | ICD-10-CM | POA: Insufficient documentation

## 2017-03-13 DIAGNOSIS — N852 Hypertrophy of uterus: Secondary | ICD-10-CM | POA: Insufficient documentation

## 2017-03-13 NOTE — Patient Instructions (Signed)
Continue working with Jeanie Sewer to attempt to lose at least 50lbs.  Dr Denman George will see you back in 2 months to evaluate weight loss efforts and diabetes control.  Please notify Dr Serita Grit office if you begin bleeding again.

## 2017-03-13 NOTE — Progress Notes (Signed)
Consult Note: Gyn-Onc   Stacey Wise 61 y.o. female  Chief Complaint  Patient presents with  . Endometrial Cancer    Assessment :Grade 1 endometrial adenocarcinoma. Morbid obesity and other comorbid medical conditions including diabetes hypertension and hypercalcemia. Currently asymptomatic with a Mirena IUD in place.  Plan:  I had extensive discussion with Stacey Wise about her condition. I discussed that grade1/2 endometrial cancer is rarely lethal. I discussed that it was a direct result of her morbid obesity. I discussed that her morbid obesity was preventing surgery, particularly because at her height, she would be unlikely to tolerate steep trendelenberg to accomplish surgery. I discussed that her large uterus (16cm) would require minilaparotomy for specimen delivery and this has a high risk of non-healing in a patient with poorly controlled diabetes and extreme abdominal adiposity.  Continue attempts at weight loss. I discussed that a weight loss of 50 lbs would be very helpful in making her a better candidate for a deep pelvic procedure. I am recommending she continue to work with her diabetes nutritionist because her diabetes means that she will need to have appropriate carb intake to prevent hypoglycemia.  I will see her back in 2 month to re-evaluate weight loss efforts (50lbs). We will recheck HbA1c at that time. If she is heading in the right direction, we will consider scheduling surgery (hysterectomy).   HPI: 61 year old Afro-American female seen in consultation request of Dr. Vanessa Kick regarding management of a newly diagnosed endometrial cancer. The patient's had a long history of irregular bleeding which is gradually become heavier. Patient underwent hysteroscopy and endometrial biopsy showing a grade 1 endometrial adenocarcinoma. Subsequently the patient's had minimal bleeding. Ultrasound showing the patient has fibroids.  Patient denies any past gynecologic history. She  has never been pregnant.  Patient has number of morbid conditions including a BMI of 58, hypertension, diabetes, and hypercalcemia.  Management options were considered and we decided to treat using a Mirena IUD which was placed in November 2017.  Following initial consultation the patient had a Mirena IUD placed. She's had no difficulty with the IUD. She denies any pelvic pain pressure or any vaginal bleeding.  Interval history:  The patient underwent D&C on 01/19/17. Mirena IUD was kept in situ during the procedure.  The pathology confirmed persistent grade1-2 endometrial cancer. She has had minimal bleeding since.  She has met with diabetes nutritionist and educator, Jeanie Sewer, in the past week and has a nutrition plan.   Review of Systems:10 point review of systems is negative except as noted in interval history.   Vitals: Blood pressure (!) 157/64, pulse 82, temperature 98.4 F (36.9 C), temperature source Oral, resp. rate 20, weight (!) 335 lb (152 kg), last menstrual period 06/23/2016, SpO2 99 %.  Physical Exam: General : The patient is a healthy woman in no acute distress.  HEENT: normocephalic, extraoccular movements normal; neck is supple without thyromegally  Lynphnodes: Supraclavicular and inguinal nodes not enlarged  Abdomen: Morbidly obese, Soft, non-tender, no ascites, no organomegally, no masses, no hernias  Pelvic:  EGBUS: Normal female  Vagina: Normal, no lesions  Urethra and Bladder: Normal, non-tender  Cervix: I'm unable to see the IUD string but unable to fully visualize the cervix despite using a very long speculum. Uterus: Cannot be assessed given the patient's habitus. Bi-manual examination: Non-tender; no adenxal masses or nodularity  Rectal: normal sphincter tone, no masses, no blood  Lower extremities: No edema or varicosities. Normal range of motion  Allergies  Allergen Reactions  . No Known Allergies     Past Medical History:  Diagnosis  Date  . Arthritis   . Complication of anesthesia    hard to wake up from parathyroidectomy  . Diabetes mellitus without complication ([place])    per patient " i am a type 2 diabetic"  . DIABETIC PERIPHERAL NEUROPATHY 02/28/2007  . DYSLIPIDEMIA 02/28/2007  . Family history of anesthesia complication    sister hard to wake up  . Heel spur, right   . Hepatitis    "states she has only been vaccinated but never had"  . HYPERTENSION 02/28/2007  . Morbid obesity ([place]) 02/28/2007  . Pneumonia    x2  . SLEEP APNEA 02/28/2007   does not use a cpap-could not use  . Trigger finger, left ring finger   . Uterine fibroid     Past Surgical History:  Procedure Laterality Date  . BREAST LUMPECTOMY WITH NEEDLE LOCALIZATION Left 01/01/2013   Procedure: LEFT BREAST LUMPECTOMY WITH NEEDLE LOCALIZATION;  Surgeon: Marcello Moores A. Cornett, MD;  Location: [facility];  Service: General;  Laterality: Left;  . CERVICAL BIOPSY  W/ LOOP ELECTRODE EXCISION    . CERVICAL POLYPECTOMY N/A 06/27/2016   Procedure: CERVICAL POLYPECTOMY;  Surgeon: Vanessa Kick, MD;  Location: [facility];  Service: Gynecology;  Laterality: N/A;  . DILATION AND CURETTAGE OF UTERUS  2009  . DILATION AND CURETTAGE OF UTERUS N/A 06/27/2016   Procedure: DILATATION AND CURETTAGE;  Surgeon: Vanessa Kick, MD;  Location: [facility];  Service: Gynecology;  Laterality: N/A;  . DILATION AND CURETTAGE OF UTERUS N/A 01/19/2017   Procedure: DILATATION AND CURETTAGE;  Surgeon: Everitt Amber, MD;  Location: [facility];  Service: Gynecology;  Laterality: N/A;  . ELECTROCARDIOGRAM  11/19/2004  . HYSTEROSCOPY N/A 06/27/2016   Procedure: HYSTEROSCOPY;  Surgeon: Vanessa Kick, MD;  Location: [facility];  Service: Gynecology;  Laterality: N/A;  . PARATHYROIDECTOMY Left 06/30/2015   Procedure: LEFT PARATHYROIDECTOMY;  Surgeon: Armandina Gemma, MD;  Location: [facility];  Service: General;  Laterality: Left;  . TUBAL LIGATION  1996    Current Outpatient Prescriptions  Medication Sig  Dispense Refill  . aspirin 81 MG tablet Take 81 mg by mouth daily.    [name] atorvastatin (LIPITOR) 20 MG tablet Take 20 mg by mouth at bedtime.     [name] diltiazem (TIAZAC) 300 MG 24 hr capsule TAKE ONE CAPSULE EVERY DAY 90 capsule 3  . hydrALAZINE (APRESOLINE) 25 MG tablet Take 25 mg by mouth 2 (two) times daily.  0  . losartan (COZAAR) 100 MG tablet Take 100 mg by mouth daily.    . metFORMIN (GLUCOPHAGE-XR) 500 MG 24 hr tablet Take 1,500 mg by mouth daily.   4  . NOVOLIN 70/30 (70-30) 100 UNIT/ML injection INJECT 110 UNITS UNDER THE SKIN WITH BREAKFAST AND 30 UNITS WITH THE EVENING MEAL. (Patient taking differently: INJECT 90 UNITS UNDER THE SKIN WITH BREAKFAST AND 30 UNITS WITH THE EVENING MEAL.) 40 mL 0  . torsemide (DEMADEX) 10 MG tablet Take 1 tablet (10 mg total) by mouth daily. **PT NEEDS APPT FOR FURTHER REFILLS** 30 tablet 1  . levonorgestrel (MIRENA) 20 MCG/24HR IUD 1 Intra Uterine Device (1 each total) by Intrauterine route once. 1 each 0   No current facility-administered medications for this visit.     Social History   Social History  . Marital status: Single    Spouse name: N/A  . Number of children: N/A  . Years of  education: N/A   Occupational History  . 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 Smoking status: Never Smoker  . Smokeless tobacco: Never Used  . Alcohol use No  . Drug use: No  . Sexual activity: Not on file   Other Topics Concern  . Not on file   Social History Narrative  . No narrative on file    Family History  Problem Relation Age of Onset  . Diabetes Mother   . Arthritis Mother   . Hypertension Mother   . Arthritis Father   . Cancer Father        Prostate  . Dementia Father   . Alzheimer's disease Father   . Cancer Sister        Lymphoma  . Arthritis Sister   . Arthritis Brother   . Cancer Sister        Peritoneal  . Arthritis Sister   . Arthritis Sister   . Arthritis Sister   .  Arthritis Brother   . Diabetes Other        Sibling  . Diabetes Other        Sibling      Donaciano Eva, MD 03/13/2017, 12:57 PM

## 2017-03-14 NOTE — Progress Notes (Signed)
Diabetes Self-Management Education  Visit Type: First/Initial  Appt. Start Time: 1000 Appt. End Time: 1100  03/14/2017  Ms. Stacey Wise, identified by name and date of birth, is a 61 y.o. female with a diagnosis of Diabetes: Type 2. Patient states her insurance only covers one hour of education at this time. She states she needs surgery for hysterectomy and endometrial cancer but cannot have it safely until she looses about 50 pounds. She states she is very motivated to do so and to control her BG better. 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 She stands and often walks while working for 8-12 hours a day. She has not additional activity outside of work. She states she is taking her 90 units Novolin 70/30 in the AM and 30 units at night as prescribed.   ASSESSMENT  Height 5\' 4"  (1.626 m), weight (!) 336 lb 8 oz (152.6 kg), last menstrual period 06/23/2016. Body mass index is 57.76 kg/m.      Diabetes Self-Management Education - 03/08/17 1009      Visit Information   Visit Type First/Initial     Initial Visit   Diabetes Type Type 2   Are you currently following a meal plan? No   Are you taking your medications as prescribed? Yes   Date Diagnosed [place]   How would you rate your overall health? Poor     Psychosocial Assessment   Patient Belief/Attitude about Diabetes --  none stated by patient   Other persons present Patient   Patient Concerns Nutrition/Meal planning;Glycemic Control;Weight Control   Special Needs None   How often do you need to have someone help you when you read instructions, pamphlets, or other written materials from your doctor or pharmacy? 1 - Never   What is the last grade level you completed in school? 12     Complications   Last HgB A1C per patient/outside source 8.5 %   How often do you check your blood sugar? 1-2 times/day   Have you had a dilated eye exam in the past 12 months? Yes   Have you had a dental exam in the past 12 months? Yes   Are you checking your feet? Yes   How many days per week are you checking your feet? 7     Dietary Intake   Breakfast Varies daily, sleeps in due to 2nd shift at work   Lunch regular oatmeal with banana occasionally   Snack (afternoon) 2 PM: grilled chicken salad with lo cal ranch dresing OR lean meat, vegetables   Dinner 9 PM: break at work: Personnel officer (evening) rarely has anything OR 1 pkg PNB crackers   Beverage(s) water     Exercise   Exercise Type Light (walking / raking leaves)  walks all during her shift of 8 or more hours/day   How many days per week to you exercise? 6   How many minutes per day do you exercise? 360   Total minutes per week of exercise 2160     Patient Education   Previous Diabetes Education Yes (please comment)   Nutrition management  Role of diet in the treatment of diabetes and the relationship between the three main macronutrients and blood glucose level;Food label reading, portion sizes and measuring food.;Carbohydrate counting   Physical activity and exercise  Role of exercise on diabetes management, blood pressure control and cardiac health.;Identified with patient nutritional and/or  medication changes necessary with exercise.;Helped patient identify appropriate exercises in relation to his/her diabetes, diabetes complications and other health issue.   Medications Reviewed patients medication for diabetes, action, purpose, timing of dose and side effects.   Monitoring Identified appropriate SMBG and/or A1C goals.     Individualized Goals (developed by patient)   Nutrition Follow meal plan discussed;Adjust meds/carbs with exercise as discussed   Physical Activity Exercise 3-5 times per week   Medications take my medication as prescribed   Monitoring  test blood glucose pre and post meals as discussed   Reducing Risk examine blood glucose patterns     Outcomes   Expected Outcomes Demonstrated  interest in learning. Expect positive outcomes   Future DMSE PRN   Program Status Completed      Individualized Plan for Diabetes Self-Management Training:   Learning Objective:  Patient will have a greater understanding of diabetes self-management. Patient education plan is to attend individual and/or group sessions per assessed needs and concerns.   Plan:   Patient Instructions  Plan:  Aim for 2 Carb Choices per meal (30 grams) +/- 1 either way  Aim for 0-1 Carbs per snack if hungry  Consider an evening snack of 1 Carb Choice and some protein to help prevent low BG during the night Include protein in moderation with your meals and snacks Consider reading food labels for Total Carbohydrate of foods Consider  increasing your activity level by doing some Arm Chair Exercises for 5 minutes several times each day and increase as tolerated Continue checking BG at alternate times per day  Continue  taking medication as directed by MD Notify Dr. Buddy Duty when BG starts dropping into the 80's for 2 or more days so he can reduce your insulin doses!   Expected Outcomes:  Demonstrated interest in learning. Expect positive outcomes  Education material provided: Food label handouts, Meal plan card and Carbohydrate counting sheet, Insulin Action handout, Arm Chair Exercise handout  If problems or questions, patient to contact team via:  Phone  Future DSME appointment: PRN I plan to see her for a weigh in on August 8th as a courtesy.

## 2017-03-17 DIAGNOSIS — I10 Essential (primary) hypertension: Secondary | ICD-10-CM | POA: Diagnosis not present

## 2017-03-17 DIAGNOSIS — Z1211 Encounter for screening for malignant neoplasm of colon: Secondary | ICD-10-CM | POA: Diagnosis not present

## 2017-03-17 DIAGNOSIS — E785 Hyperlipidemia, unspecified: Secondary | ICD-10-CM | POA: Diagnosis not present

## 2017-03-17 DIAGNOSIS — E1165 Type 2 diabetes mellitus with hyperglycemia: Secondary | ICD-10-CM | POA: Diagnosis not present

## 2017-03-27 DIAGNOSIS — Z1211 Encounter for screening for malignant neoplasm of colon: Secondary | ICD-10-CM | POA: Diagnosis not present

## 2017-05-04 DIAGNOSIS — M8580 Other specified disorders of bone density and structure, unspecified site: Secondary | ICD-10-CM | POA: Diagnosis not present

## 2017-05-04 DIAGNOSIS — Z794 Long term (current) use of insulin: Secondary | ICD-10-CM | POA: Diagnosis not present

## 2017-05-04 DIAGNOSIS — E119 Type 2 diabetes mellitus without complications: Secondary | ICD-10-CM | POA: Diagnosis not present

## 2017-05-15 ENCOUNTER — Other Ambulatory Visit (HOSPITAL_BASED_OUTPATIENT_CLINIC_OR_DEPARTMENT_OTHER): Payer: BLUE CROSS/BLUE SHIELD

## 2017-05-15 DIAGNOSIS — E0821 Diabetes mellitus due to underlying condition with diabetic nephropathy: Secondary | ICD-10-CM | POA: Diagnosis not present

## 2017-05-15 DIAGNOSIS — E119 Type 2 diabetes mellitus without complications: Secondary | ICD-10-CM

## 2017-05-16 LAB — HEMOGLOBIN A1C
Est. average glucose Bld gHb Est-mCnc: 166 mg/dL
Hemoglobin A1c: 7.4 % — ABNORMAL HIGH (ref 4.8–5.6)

## 2017-05-19 ENCOUNTER — Ambulatory Visit: Payer: BLUE CROSS/BLUE SHIELD | Attending: Gynecologic Oncology | Admitting: Gynecologic Oncology

## 2017-05-19 ENCOUNTER — Encounter: Payer: Self-pay | Admitting: Gynecologic Oncology

## 2017-05-19 VITALS — BP 140/67 | HR 86 | Temp 98.2°F | Resp 20 | Wt 328.3 lb

## 2017-05-19 DIAGNOSIS — Z7982 Long term (current) use of aspirin: Secondary | ICD-10-CM | POA: Insufficient documentation

## 2017-05-19 DIAGNOSIS — Z833 Family history of diabetes mellitus: Secondary | ICD-10-CM | POA: Insufficient documentation

## 2017-05-19 DIAGNOSIS — G4730 Sleep apnea, unspecified: Secondary | ICD-10-CM | POA: Insufficient documentation

## 2017-05-19 DIAGNOSIS — E1142 Type 2 diabetes mellitus with diabetic polyneuropathy: Secondary | ICD-10-CM | POA: Diagnosis not present

## 2017-05-19 DIAGNOSIS — Z8249 Family history of ischemic heart disease and other diseases of the circulatory system: Secondary | ICD-10-CM | POA: Diagnosis not present

## 2017-05-19 DIAGNOSIS — Z975 Presence of (intrauterine) contraceptive device: Secondary | ICD-10-CM

## 2017-05-19 DIAGNOSIS — Z794 Long term (current) use of insulin: Secondary | ICD-10-CM | POA: Insufficient documentation

## 2017-05-19 DIAGNOSIS — Z8042 Family history of malignant neoplasm of prostate: Secondary | ICD-10-CM | POA: Diagnosis not present

## 2017-05-19 DIAGNOSIS — E785 Hyperlipidemia, unspecified: Secondary | ICD-10-CM | POA: Insufficient documentation

## 2017-05-19 DIAGNOSIS — Z6841 Body Mass Index (BMI) 40.0 and over, adult: Secondary | ICD-10-CM | POA: Diagnosis not present

## 2017-05-19 DIAGNOSIS — Z8261 Family history of arthritis: Secondary | ICD-10-CM | POA: Insufficient documentation

## 2017-05-19 DIAGNOSIS — C541 Malignant neoplasm of endometrium: Secondary | ICD-10-CM | POA: Diagnosis not present

## 2017-05-19 DIAGNOSIS — Z79899 Other long term (current) drug therapy: Secondary | ICD-10-CM | POA: Insufficient documentation

## 2017-05-19 DIAGNOSIS — I10 Essential (primary) hypertension: Secondary | ICD-10-CM | POA: Diagnosis not present

## 2017-05-19 DIAGNOSIS — E119 Type 2 diabetes mellitus without complications: Secondary | ICD-10-CM

## 2017-05-19 NOTE — Progress Notes (Signed)
Consult Note: Gyn-Onc   Stacey Wise 61 y.o. female  Chief Complaint  Patient presents with  . Endometrial cancer [facility])    Assessment :Grade 1 endometrial adenocarcinoma. Morbid obesity and other comorbid medical conditions including diabetes hypertension and hypercalcemia. Currently asymptomatic with a Mirena IUD in place. No bleeding, successful weight loss x 20lbs in 4month.  Plan:  Continue attempts at weight loss. I will see her back in 2 month to re-evaluate weight loss efforts (<300lbs). We will recheck HbA1c at that time. If she is heading in the right direction, we will consider scheduling surgery (hysterectomy). She would require a minilaparotomy for specimen delivery.   HPI: 61year old Afro-American female seen in consultation request of Dr. KVanessa Kickregarding management of a newly diagnosed endometrial cancer. The patient's had a long history of irregular bleeding which is gradually become heavier. Patient underwent hysteroscopy and endometrial biopsy showing a grade 1 endometrial adenocarcinoma. Subsequently the patient's had minimal bleeding. Ultrasound showing the patient has fibroids.  Patient denies any past gynecologic history. She has never been pregnant.  Patient has number of morbid conditions including a BMI of 58, hypertension, diabetes, and hypercalcemia.  Management options were considered and we decided to treat using a Mirena IUD which was placed in November 2017.  Following initial consultation the patient had a Mirena IUD placed. She's had no difficulty with the IUD. She denies any pelvic pain pressure or any vaginal bleeding.  Interval history:  The patient underwent D&C on 01/19/17. Mirena IUD was kept in situ during the procedure.  The pathology confirmed persistent grade1-2 endometrial cancer. She has had minimal bleeding since.  She has met with diabetes nutritionist and educator, BJeanie Sewer and has a nutrition plan.  She lost 20lbs  (approx) in 2 months. Her A1c is down to 7.4 (from 8.5).  Review of Systems:10 point review of systems is negative except as noted in interval history.   Vitals: Blood pressure 140/67, pulse 86, temperature 98.2 F (36.8 C), temperature source Oral, resp. rate 20, weight (!) 328 lb 4.8 oz (148.9 kg), last menstrual period 06/23/2016, SpO2 97 %.  Physical Exam: General : The patient is a healthy woman in no acute distress.  HEENT: normocephalic, extraoccular movements normal; neck is supple without thyromegally  Lynphnodes: Supraclavicular and inguinal nodes not enlarged  Abdomen: Morbidly obese, Soft, non-tender, no ascites, no organomegally, no masses, no hernias  Pelvic:  EGBUS: Normal female  Vagina: Normal, no lesions  Urethra and Bladder: Normal, non-tender  Cervix: I'm unable to see the IUD string but unable to fully visualize the cervix despite using a very long speculum. Uterus: Cannot be assessed given the patient's habitus. Bi-manual examination: Non-tender; no adenxal masses or nodularity  Rectal: normal sphincter tone, no masses, no blood  Lower extremities: No edema or varicosities. Normal range of motion      Allergies  Allergen Reactions  . No Known Allergies     Past Medical History:  Diagnosis Date  . Arthritis   . Complication of anesthesia    hard to wake up from parathyroidectomy  . [place]    per patient " i am a type 2 diabetic"  . DIABETIC PERIPHERAL NEUROPATHY 02/28/2007  . DYSLIPIDEMIA 02/28/2007  . Family history of anesthesia complication    sister hard to wake up  . Heel spur, right   . Hepatitis    "states she has only been vaccinated but never had"  . HYPERTENSION 02/28/2007  . Morbid obesity (  [place]) 02/28/2007  . Pneumonia    x2  . SLEEP APNEA 02/28/2007   does not use a cpap-could not use  . Trigger finger, left ring finger   . Uterine fibroid     Past Surgical History:  Procedure Laterality Date  .  BREAST LUMPECTOMY WITH NEEDLE LOCALIZATION Left 01/01/2013   Procedure: LEFT BREAST LUMPECTOMY WITH NEEDLE LOCALIZATION;  Surgeon: Marcello Moores A. Cornett, MD;  Location: [facility];  Service: General;  Laterality: Left;  . CERVICAL BIOPSY  W/ LOOP ELECTRODE EXCISION    . CERVICAL POLYPECTOMY N/A 06/27/2016   Procedure: CERVICAL POLYPECTOMY;  Surgeon: Vanessa Kick, MD;  Location: [facility];  Service: Gynecology;  Laterality: N/A;  . DILATION AND CURETTAGE OF UTERUS  2009  . DILATION AND CURETTAGE OF UTERUS N/A 06/27/2016   Procedure: DILATATION AND CURETTAGE;  Surgeon: Vanessa Kick, MD;  Location: [facility];  Service: Gynecology;  Laterality: N/A;  . DILATION AND CURETTAGE OF UTERUS N/A 01/19/2017   Procedure: DILATATION AND CURETTAGE;  Surgeon: Everitt Amber, MD;  [facility];  Service: Gynecology;  Laterality: N/A;  . ELECTROCARDIOGRAM  11/19/2004  . HYSTEROSCOPY N/A 06/27/2016   Procedure: HYSTEROSCOPY;  Surgeon: Vanessa Kick, MD;  Location: [facility];  Service: Gynecology;  Laterality: N/A;  . PARATHYROIDECTOMY Left 06/30/2015   Procedure: LEFT PARATHYROIDECTOMY;  Surgeon: Armandina Gemma, MD;  Location[facility];  Service: General;  Laterality: Left;  . TUBAL LIGATION  1996    Current Outpatient Prescriptions  Medication Sig Dispense Refill  . aspirin 81 MG tablet Take 81 mg by mouth daily.    [name] atorvastatin (LIPITOR) 20 MG tablet Take 20 mg by mouth at bedtime.     [name] diltiazem (TIAZAC) 300 MG 24 hr capsule TAKE ONE CAPSULE EVERY DAY 90 capsule 3  . hydrALAZINE (APRESOLINE) 25 MG tablet Take 25 mg by mouth 2 (two) times daily.  0  . losartan (COZAAR) 100 MG tablet Take 100 mg by mouth daily.    . metFORMIN (GLUCOPHAGE-XR) 500 MG 24 hr tablet Take 1,500 mg by mouth daily.   4  . NOVOLIN 70/30 (70-30) 100 UNIT/ML injection INJECT 110 UNITS UNDER THE SKIN WITH BREAKFAST AND 30 UNITS WITH THE EVENING MEAL. (Patient taking differently: INJECT 90 UNITS UNDER THE SKIN WITH BREAKFAST AND 30 UNITS WITH  THE EVENING MEAL.) 40 mL 0  . torsemide (DEMADEX) 10 MG tablet Take 1 tablet (10 mg total) by mouth daily. **PT NEEDS APPT FOR FURTHER REFILLS** 30 tablet 1  . levonorgestrel (MIRENA) 20 MCG/24HR IUD 1 Intra Uterine Device (1 each total) by Intrauterine route once. 1 each 0   No current facility-administered medications for this visit.     Social History   Social History  . Marital status: Single    Spouse name: N/A  . Number of children: N/A  . 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 Smoking status: Never Smoker  . Smokeless tobacco: Never Used  . Alcohol use No  . Drug use: No  . Sexual activity: Not on file   Other Topics Concern  . Not on file   Social History Narrative  . No narrative on file    Family History  Problem Relation Age of Onset  . Diabetes Mother   . Arthritis Mother   . Hypertension Mother   . Arthritis Father   . Cancer Father  Prostate  . Dementia Father   . Alzheimer's disease Father   . Cancer Sister        Lymphoma  . Arthritis Sister   . Arthritis Brother   . Cancer Sister        Peritoneal  . Arthritis Sister   . Arthritis Sister   . Arthritis Sister   . Arthritis Brother   . Diabetes Other        Sibling  . Diabetes Other        Sibling      Donaciano Eva, MD 05/19/2017, 1:05 PM

## 2017-05-19 NOTE — Patient Instructions (Signed)
Continue your efforts for weight loss over the next 2 months.  Please return to see Dr Denman George as scheduled in November to discuss the possibility of surgery if your weight is less than 300lbs.

## 2017-07-05 ENCOUNTER — Other Ambulatory Visit: Payer: Self-pay | Admitting: Internal Medicine

## 2017-07-05 ENCOUNTER — Ambulatory Visit
Admission: RE | Admit: 2017-07-05 | Discharge: 2017-07-05 | Disposition: A | Discharge status: Home / Self Care | Payer: BLUE CROSS/BLUE SHIELD | Source: Ambulatory Visit | Attending: Internal Medicine | Admitting: Internal Medicine

## 2017-07-05 DIAGNOSIS — Z794 Long term (current) use of insulin: Secondary | ICD-10-CM | POA: Diagnosis not present

## 2017-07-05 DIAGNOSIS — E119 Type 2 diabetes mellitus without complications: Secondary | ICD-10-CM | POA: Diagnosis not present

## 2017-07-05 DIAGNOSIS — S4990XA Unspecified injury of shoulder and upper arm, unspecified arm, initial encounter: Secondary | ICD-10-CM

## 2017-07-05 DIAGNOSIS — M8580 Other specified disorders of bone density and structure, unspecified site: Secondary | ICD-10-CM | POA: Diagnosis not present

## 2017-07-05 DIAGNOSIS — S4992XA Unspecified injury of left shoulder and upper arm, initial encounter: Secondary | ICD-10-CM | POA: Diagnosis not present

## 2017-07-19 ENCOUNTER — Other Ambulatory Visit: Payer: Self-pay | Admitting: Gynecologic Oncology

## 2017-07-19 ENCOUNTER — Telehealth: Payer: Self-pay | Admitting: *Deleted

## 2017-07-19 DIAGNOSIS — E1142 Type 2 diabetes mellitus with diabetic polyneuropathy: Secondary | ICD-10-CM

## 2017-07-19 NOTE — Telephone Encounter (Signed)
Called and left the patient a message to call the office back. The patient needs to come in on Friday for a lab appt.

## 2017-07-21 ENCOUNTER — Ambulatory Visit (HOSPITAL_BASED_OUTPATIENT_CLINIC_OR_DEPARTMENT_OTHER): Payer: BLUE CROSS/BLUE SHIELD

## 2017-07-21 DIAGNOSIS — E1142 Type 2 diabetes mellitus with diabetic polyneuropathy: Secondary | ICD-10-CM

## 2017-07-22 LAB — HEMOGLOBIN A1C
Est. average glucose Bld gHb Est-mCnc: 160 mg/dL
Hemoglobin A1c: 7.2 % — ABNORMAL HIGH (ref 4.8–5.6)

## 2017-07-24 ENCOUNTER — Ambulatory Visit: Payer: BLUE CROSS/BLUE SHIELD | Admitting: Gynecologic Oncology

## 2017-07-24 ENCOUNTER — Encounter: Payer: Self-pay | Admitting: Gynecologic Oncology

## 2017-07-24 ENCOUNTER — Ambulatory Visit: Payer: BLUE CROSS/BLUE SHIELD | Attending: Gynecologic Oncology | Admitting: Gynecologic Oncology

## 2017-07-24 VITALS — BP 145/68 | HR 82 | Temp 98.0°F | Resp 20 | Ht 64.0 in | Wt 330.9 lb

## 2017-07-24 DIAGNOSIS — Z6841 Body Mass Index (BMI) 40.0 and over, adult: Secondary | ICD-10-CM | POA: Diagnosis not present

## 2017-07-24 DIAGNOSIS — Z807 Family history of other malignant neoplasms of lymphoid, hematopoietic and related tissues: Secondary | ICD-10-CM | POA: Diagnosis not present

## 2017-07-24 DIAGNOSIS — C541 Malignant neoplasm of endometrium: Secondary | ICD-10-CM | POA: Insufficient documentation

## 2017-07-24 DIAGNOSIS — Z808 Family history of malignant neoplasm of other organs or systems: Secondary | ICD-10-CM | POA: Insufficient documentation

## 2017-07-24 DIAGNOSIS — Z833 Family history of diabetes mellitus: Secondary | ICD-10-CM | POA: Diagnosis not present

## 2017-07-24 DIAGNOSIS — Z975 Presence of (intrauterine) contraceptive device: Secondary | ICD-10-CM

## 2017-07-24 DIAGNOSIS — E1142 Type 2 diabetes mellitus with diabetic polyneuropathy: Secondary | ICD-10-CM | POA: Diagnosis not present

## 2017-07-24 DIAGNOSIS — Z8261 Family history of arthritis: Secondary | ICD-10-CM | POA: Insufficient documentation

## 2017-07-24 DIAGNOSIS — Z8249 Family history of ischemic heart disease and other diseases of the circulatory system: Secondary | ICD-10-CM | POA: Insufficient documentation

## 2017-07-24 DIAGNOSIS — E119 Type 2 diabetes mellitus without complications: Secondary | ICD-10-CM | POA: Diagnosis not present

## 2017-07-24 DIAGNOSIS — M1990 Unspecified osteoarthritis, unspecified site: Secondary | ICD-10-CM | POA: Diagnosis not present

## 2017-07-24 DIAGNOSIS — Z8042 Family history of malignant neoplasm of prostate: Secondary | ICD-10-CM | POA: Insufficient documentation

## 2017-07-24 DIAGNOSIS — Z79899 Other long term (current) drug therapy: Secondary | ICD-10-CM | POA: Insufficient documentation

## 2017-07-24 DIAGNOSIS — I10 Essential (primary) hypertension: Secondary | ICD-10-CM | POA: Insufficient documentation

## 2017-07-24 DIAGNOSIS — Z7982 Long term (current) use of aspirin: Secondary | ICD-10-CM | POA: Insufficient documentation

## 2017-07-24 DIAGNOSIS — Z794 Long term (current) use of insulin: Secondary | ICD-10-CM | POA: Diagnosis not present

## 2017-07-24 DIAGNOSIS — Z820 Family history of epilepsy and other diseases of the nervous system: Secondary | ICD-10-CM | POA: Insufficient documentation

## 2017-07-24 DIAGNOSIS — G4730 Sleep apnea, unspecified: Secondary | ICD-10-CM | POA: Diagnosis not present

## 2017-07-24 DIAGNOSIS — E785 Hyperlipidemia, unspecified: Secondary | ICD-10-CM | POA: Diagnosis not present

## 2017-07-24 NOTE — Progress Notes (Signed)
Consult Note: Gyn-Onc   Stacey Wise 61 y.o. female  Chief Complaint  Patient presents with  . Endometrial cancer [facility])    Assessment :Grade 1 endometrial adenocarcinoma. Morbid obesity and other comorbid medical conditions including diabetes hypertension and hypercalcemia. Currently asymptomatic with a Mirena IUD in place. No bleeding, nor further success with weight loss. Remains non-operative candidate.  Plan:  Continue attempts at weight loss. I will see her back in 3 month to re-evaluate weight loss efforts (<300lbs). We will recheck HbA1c at that time. If she is heading in the right direction, we will consider scheduling surgery (hysterectomy). She would require a minilaparotomy for specimen delivery.  HPI: 61 year old Afro-American female seen in consultation request of Dr. Vanessa Kick regarding management of a newly diagnosed endometrial cancer. The patient's had a long history of irregular bleeding which is gradually become heavier. Patient underwent hysteroscopy and endometrial biopsy showing a grade 1 endometrial adenocarcinoma. Subsequently the patient's had minimal bleeding. Ultrasound showing the patient has fibroids.  Patient denies any past gynecologic history. She has never been pregnant.  Patient has number of morbid conditions including a BMI of 58, hypertension, diabetes, and hypercalcemia.  Management options were considered and we decided to treat using a Mirena IUD which was placed in November 2017.  Following initial consultation the patient had a Mirena IUD placed. She's had no difficulty with the IUD. She denies any pelvic pain pressure or any vaginal bleeding.  The patient underwent D&C on 01/19/17. Mirena IUD was kept in situ during the procedure.  The pathology confirmed persistent grade1-2 endometrial cancer.  She met with diabetes nutritionist and educator, Jeanie Sewer, and has a nutrition plan and she lost 20lbs (approx) in 2 months in  2018.  Interval history:  Her A1c is down to 7.2 (from 8.5).  However, in the past 2 months she has gained 2 pounds due to inability to work out due to arthritis.   She has had no further bleeding since her D&C.  Review of Systems:10 point review of systems is negative except as noted in interval history.   Vitals: Blood pressure (!) 145/68, pulse 82, temperature 98 F (36.7 C), temperature source Oral, resp. rate 20, height '5\' 4"'  (1.626 m), weight (!) 330 lb 14.4 oz (150.1 kg), last menstrual period 06/23/2016, SpO2 97 %.  Physical Exam: General : The patient is a healthy woman in no acute distress.  HEENT: normocephalic, extraoccular movements normal; neck is supple without thyromegally  Lynphnodes: Supraclavicular and inguinal nodes not enlarged  Abdomen: Morbidly obese, Soft, non-tender, no ascites, no organomegally, no masses, no hernias  Pelvic:  EGBUS: Normal female  Vagina: Normal, no lesions  Urethra and Bladder: Normal, non-tender  Cervix: I'm unable to see the IUD string but unable to fully visualize the cervix despite using a very long speculum. Unable to palpate the cervix adequately in order to obtain manual biopsy.  Uterus: Cannot be assessed given the patient's habitus. Bi-manual examination: Non-tender; no adenxal masses or nodularity  Rectal: normal sphincter tone, no masses, no blood  Lower extremities: No edema or varicosities. Normal range of motion      Allergies  Allergen Reactions  . No Known Allergies     Past Medical History:  Diagnosis Date  . Arthritis   . Complication of anesthesia    hard to wake up from parathyroidectomy  . Diabetes mellitus without complication ([place])    per patient " i am a type 2 diabetic"  . DIABETIC PERIPHERAL NEUROPATHY 02/28/2007  .  DYSLIPIDEMIA 02/28/2007  . Family history of anesthesia complication    sister hard to wake up  . Heel spur, right   . Hepatitis    "states she has only been vaccinated but never had"  .  HYPERTENSION 02/28/2007  . Morbid obesity ([place]) 02/28/2007  . Pneumonia    x2  . SLEEP APNEA 02/28/2007   does not use a cpap-could not use  . Trigger finger, left ring finger   . Uterine fibroid     Past Surgical History:  Procedure Laterality Date  . BREAST LUMPECTOMY WITH NEEDLE LOCALIZATION Left 01/01/2013   Procedure: LEFT BREAST LUMPECTOMY WITH NEEDLE LOCALIZATION;  Surgeon: Marcello Moores A. Cornett, MD;  Location: [place];  Service: General;  Laterality: Left;  . CERVICAL BIOPSY  W/ LOOP ELECTRODE EXCISION    . CERVICAL POLYPECTOMY N/A 06/27/2016   Procedure: CERVICAL POLYPECTOMY;  Surgeon: Vanessa Kick, MD;  Location: [facility];  Service: Gynecology;  Laterality: N/A;  . DILATION AND CURETTAGE OF UTERUS  2009  . DILATION AND CURETTAGE OF UTERUS N/A 06/27/2016   Procedure: DILATATION AND CURETTAGE;  Surgeon: Vanessa Kick, MD;  Location: [facility];  Service: Gynecology;  Laterality: N/A;  . DILATION AND CURETTAGE OF UTERUS N/A 01/19/2017   Procedure: DILATATION AND CURETTAGE;  Surgeon: Everitt Amber, MD;  Location: [facility];  Service: Gynecology;  Laterality: N/A;  . ELECTROCARDIOGRAM  11/19/2004  . HYSTEROSCOPY N/A 06/27/2016   Procedure: HYSTEROSCOPY;  Surgeon: Vanessa Kick, MD;  Location: [facility];  Service: Gynecology;  Laterality: N/A;  . PARATHYROIDECTOMY Left 06/30/2015   Procedure: LEFT PARATHYROIDECTOMY;  Surgeon: Armandina Gemma, MD;  Location: [facility];  Service: General;  Laterality: Left;  . TUBAL LIGATION  1996    Current Outpatient Medications  Medication Sig Dispense Refill  . aspirin 81 MG tablet Take 81 mg by mouth daily.    [name] atorvastatin (LIPITOR) 20 MG tablet Take 20 mg by mouth at bedtime.     [name] diltiazem (TIAZAC) 300 MG 24 hr capsule TAKE ONE CAPSULE EVERY DAY 90 capsule 3  . hydrALAZINE (APRESOLINE) 25 MG tablet Take 25 mg by mouth 2 (two) times daily.  0  . losartan (COZAAR) 100 MG tablet Take 100 mg by mouth daily.    . metFORMIN (GLUCOPHAGE-XR) 500 MG 24 hr tablet  Take 1,500 mg by mouth daily.   4  . NOVOLIN 70/30 (70-30) 100 UNIT/ML injection INJECT 110 UNITS UNDER THE SKIN WITH BREAKFAST AND 30 UNITS WITH THE EVENING MEAL. (Patient taking differently: INJECT 90 UNITS UNDER THE SKIN WITH BREAKFAST AND 30 UNITS WITH THE EVENING MEAL.) 40 mL 0  . torsemide (DEMADEX) 10 MG tablet Take 1 tablet (10 mg total) by mouth daily. **PT NEEDS APPT FOR FURTHER REFILLS** 30 tablet 1  . levonorgestrel (MIRENA) 20 MCG/24HR IUD 1 Intra Uterine Device (1 each total) by Intrauterine route once. 1 each 0   No current facility-administered medications for this visit.     Social History   Socioeconomic History  . Marital status: Single    Spouse name: Not on file  . Number of children: Not on file  . Years of education: Not on file  . Highest education level: Not on file  Social Needs  . Financial resource strain: Not on file  . Food insecurity - worry: Not on file  . Food insecurity - inability: Not on file  . Transportation needs - medical: Not on file  . Transportation needs - non-medical: Not on file  Occupational History  . Occupation: Sales executive for [employer]: [place]  Tobacco Use  . Smoking status: Never Smoker  . Smokeless tobacco: Never Used  Substance and Sexual Activity  . Alcohol use: No  . Drug use: No  . Sexual activity: Not on file  Other Topics Concern  . Not on file  Social History Narrative  . Not on file    Family History  Problem Relation Age of Onset  . Diabetes Mother   . Arthritis Mother   . Hypertension Mother   . Arthritis Father   . Cancer Father        Prostate  . Dementia Father   . Alzheimer's disease Father   . Cancer Sister        Lymphoma  . Arthritis Sister   . Arthritis Brother   . Cancer Sister        Peritoneal  . Arthritis Sister   . Arthritis Sister   . Arthritis Sister   . Arthritis Brother   . Diabetes Other        Sibling  . Diabetes Other        Sibling       Donaciano Eva, MD 07/24/2017, 5:36 PM

## 2017-07-24 NOTE — Patient Instructions (Signed)
Dr Denman George will see you again for follow-up in February to evaluate for weight loss and to see if surgery is a safe option.  In the meantime, continue with weight loss efforts.

## 2017-09-19 DIAGNOSIS — I10 Essential (primary) hypertension: Secondary | ICD-10-CM | POA: Diagnosis not present

## 2017-09-19 DIAGNOSIS — E785 Hyperlipidemia, unspecified: Secondary | ICD-10-CM | POA: Diagnosis not present

## 2017-09-19 DIAGNOSIS — E669 Obesity, unspecified: Secondary | ICD-10-CM | POA: Diagnosis not present

## 2017-09-19 DIAGNOSIS — E1165 Type 2 diabetes mellitus with hyperglycemia: Secondary | ICD-10-CM | POA: Diagnosis not present

## 2017-09-22 ENCOUNTER — Other Ambulatory Visit: Payer: BLUE CROSS/BLUE SHIELD

## 2017-09-25 ENCOUNTER — Ambulatory Visit: Payer: BLUE CROSS/BLUE SHIELD | Admitting: Gynecologic Oncology

## 2017-10-20 DIAGNOSIS — E785 Hyperlipidemia, unspecified: Secondary | ICD-10-CM | POA: Diagnosis not present

## 2017-10-20 DIAGNOSIS — I10 Essential (primary) hypertension: Secondary | ICD-10-CM | POA: Diagnosis not present

## 2017-10-20 DIAGNOSIS — E669 Obesity, unspecified: Secondary | ICD-10-CM | POA: Diagnosis not present

## 2017-10-20 DIAGNOSIS — E1165 Type 2 diabetes mellitus with hyperglycemia: Secondary | ICD-10-CM | POA: Diagnosis not present

## 2017-10-25 DIAGNOSIS — E119 Type 2 diabetes mellitus without complications: Secondary | ICD-10-CM | POA: Diagnosis not present

## 2017-10-25 DIAGNOSIS — M8580 Other specified disorders of bone density and structure, unspecified site: Secondary | ICD-10-CM | POA: Diagnosis not present

## 2017-10-25 DIAGNOSIS — Z794 Long term (current) use of insulin: Secondary | ICD-10-CM | POA: Diagnosis not present

## 2017-11-01 ENCOUNTER — Inpatient Hospital Stay: Payer: BLUE CROSS/BLUE SHIELD | Attending: Gynecologic Oncology

## 2017-11-01 DIAGNOSIS — E1142 Type 2 diabetes mellitus with diabetic polyneuropathy: Secondary | ICD-10-CM

## 2017-11-01 DIAGNOSIS — C541 Malignant neoplasm of endometrium: Secondary | ICD-10-CM | POA: Insufficient documentation

## 2017-11-01 LAB — HEMOGLOBIN A1C
Hgb A1c MFr Bld: 7.7 % — ABNORMAL HIGH (ref 4.8–5.6)
Mean Plasma Glucose: 174.29 mg/dL

## 2017-11-02 ENCOUNTER — Inpatient Hospital Stay (HOSPITAL_BASED_OUTPATIENT_CLINIC_OR_DEPARTMENT_OTHER): Payer: BLUE CROSS/BLUE SHIELD | Admitting: Gynecologic Oncology

## 2017-11-02 ENCOUNTER — Other Ambulatory Visit: Payer: BLUE CROSS/BLUE SHIELD

## 2017-11-02 ENCOUNTER — Encounter: Payer: Self-pay | Admitting: Gynecologic Oncology

## 2017-11-02 VITALS — BP 137/66 | HR 85 | Temp 97.9°F | Resp 18 | Ht 64.0 in | Wt 327.9 lb

## 2017-11-02 DIAGNOSIS — C541 Malignant neoplasm of endometrium: Secondary | ICD-10-CM | POA: Diagnosis not present

## 2017-11-02 NOTE — Progress Notes (Signed)
Consult Note: Gyn-Onc   Stacey Wise 62 y.o. female  Chief Complaint  Patient presents with  . Endometrial cancer [facility])    Assessment :Grade 1 endometrial adenocarcinoma. Morbid obesity and other comorbid medical conditions including diabetes hypertension and hypercalcemia. Currently asymptomatic with a Mirena IUD in place. No bleeding, nor further success with weight loss. Remains non-operative candidate for deep pelvic surgery. I explained that the issue was exposure to the deep pelvic organs is limited with such extensive visceral adiposity. We again discussed the option of bariatric surgery (she denied I had brought this up before however in June 2018 she was provided with contact information for them). She asked why bariatric surgery would be an option but not hysterectomy.  I explained that the deep pelvic organs are in a different anatomic location to the foregut which limits access to these organs for safe surgery.   Plan:  Continue attempts at weight loss. I will see her back in 3 months to re-evaluate weight loss efforts (<300lbs). If she is heading in the right direction, we will consider scheduling surgery (hysterectomy). She would require a minilaparotomy for specimen delivery.  HPI: 62 year old Afro-American female seen in consultation request of Dr. Vanessa Kick regarding management of a newly diagnosed endometrial cancer. The patient's had a long history of irregular bleeding which is gradually become heavier. Patient underwent hysteroscopy and endometrial biopsy showing a grade 1 endometrial adenocarcinoma. Subsequently the patient's had minimal bleeding. Ultrasound showing the patient has fibroids.  Patient denies any past gynecologic history. She has never been pregnant.  Patient has number of morbid conditions including a BMI of 58, hypertension, diabetes, and hypercalcemia.  Management options were considered and we decided to treat using a Mirena IUD which was  placed in November 2017.  Following initial consultation the patient had a Mirena IUD placed. She's had no difficulty with the IUD. She denies any pelvic pain pressure or any vaginal bleeding.  The patient underwent D&C on 01/19/17. Mirena IUD was kept in situ during the procedure.  The pathology confirmed persistent grade1-2 endometrial cancer.  She met with diabetes nutritionist and educator, Jeanie Sewer, and has a nutrition plan and she lost 20lbs (approx) in 2 months in 2018.  Interval history:  Her A1c is up to 7.7 (from 7.2).  She has lost 5 lbs in the past 3 months   She has had no further bleeding since her D&C.  Review of Systems:10 point review of systems is negative except as noted in interval history.   Vitals: Blood pressure 137/66, pulse 85, temperature 97.9 F (36.6 C), temperature source Oral, resp. rate 18, height '5\' 4"'  (1.626 m), weight (!) 327 lb 14.4 oz (148.7 kg), last menstrual period 06/23/2016, SpO2 98 %.  Physical Exam: General : The patient is a healthy woman in no acute distress.  HEENT: normocephalic, extraoccular movements normal; neck is supple without thyromegally  Lynphnodes: Supraclavicular and inguinal nodes not enlarged  Abdomen: Morbidly obese, Soft, non-tender, no ascites, no organomegally, no masses, no hernias  Pelvic:  EGBUS: Normal female  Vagina: Normal, no lesions  Urethra and Bladder: Normal, non-tender  Cervix: I'm unable to see the IUD string but unable to fully visualize the cervix despite using a very long speculum. Unable to palpate the cervix adequately in order to obtain manual biopsy.  Uterus: Cannot be assessed given the patient's habitus. Bi-manual examination: Non-tender; no adenxal masses or nodularity  Rectal: normal sphincter tone, no masses, no blood  Lower extremities: No edema or  varicosities. Normal range of motion      Allergies  Allergen Reactions  . No Known Allergies     Past Medical History:  Diagnosis  Date  . Arthritis   . Complication of anesthesia    hard to wake up from parathyroidectomy  . Diabetes mellitus without complication ([place])    per patient " i am a type 2 diabetic"  . DIABETIC PERIPHERAL NEUROPATHY 02/28/2007  . DYSLIPIDEMIA 02/28/2007  . Family history of anesthesia complication    sister hard to wake up  . Heel spur, right   . Hepatitis    "states she has only been vaccinated but never had"  . HYPERTENSION 02/28/2007  . Morbid obesity ([place]) 02/28/2007  . Pneumonia    x2  . SLEEP APNEA 02/28/2007   does not use a cpap-could not use  . Trigger finger, left ring finger   . Uterine fibroid     Past Surgical History:  Procedure Laterality Date  . BREAST LUMPECTOMY WITH NEEDLE LOCALIZATION Left 01/01/2013   Procedure: LEFT BREAST LUMPECTOMY WITH NEEDLE LOCALIZATION;  Surgeon: Marcello Moores A. Cornett, MD;  Location: [place];  Service: General;  Laterality: Left;  . CERVICAL BIOPSY  W/ LOOP ELECTRODE EXCISION    . CERVICAL POLYPECTOMY N/A 06/27/2016   Procedure: CERVICAL POLYPECTOMY;  Surgeon: Vanessa Kick, MD;  Location: [facility];  Service: Gynecology;  Laterality: N/A;  . DILATION AND CURETTAGE OF UTERUS  2009  . DILATION AND CURETTAGE OF UTERUS N/A 06/27/2016   Procedure: DILATATION AND CURETTAGE;  Surgeon: Vanessa Kick, MD;  Location: [facility];  Service: Gynecology;  Laterality: N/A;  . DILATION AND CURETTAGE OF UTERUS N/A 01/19/2017   Procedure: DILATATION AND CURETTAGE;  Surgeon: Everitt Amber, MD;  [facility];  Service: Gynecology;  Laterality: N/A;  . ELECTROCARDIOGRAM  11/19/2004  . HYSTEROSCOPY N/A 06/27/2016   Procedure: HYSTEROSCOPY;  Surgeon: Vanessa Kick, MD;  Location: [facility];  Service: Gynecology;  Laterality: N/A;  . PARATHYROIDECTOMY Left 06/30/2015   Procedure: LEFT PARATHYROIDECTOMY;  Surgeon: Armandina Gemma, MD;  [facility];  Service: General;  Laterality: Left;  . TUBAL LIGATION  1996    Current Outpatient Medications  Medication Sig Dispense  Refill  . aspirin 81 MG tablet Take 81 mg by mouth daily.    [name] atorvastatin (LIPITOR) 20 MG tablet Take 20 mg by mouth at bedtime.     [name] diltiazem (TIAZAC) 300 MG 24 hr capsule TAKE ONE CAPSULE EVERY DAY 90 capsule 3  . hydrALAZINE (APRESOLINE) 25 MG tablet Take 25 mg by mouth 2 (two) times daily.  0  . losartan (COZAAR) 100 MG tablet Take 100 mg by mouth daily.    . metFORMIN (GLUCOPHAGE-XR) 500 MG 24 hr tablet Take 1,500 mg by mouth daily.   4  . NOVOLIN 70/30 (70-30) 100 UNIT/ML injection INJECT 110 UNITS UNDER THE SKIN WITH BREAKFAST AND 30 UNITS WITH THE EVENING MEAL. (Patient taking differently: INJECT 90 UNITS UNDER THE SKIN WITH BREAKFAST AND 30 UNITS WITH THE EVENING MEAL.) 40 mL 0  . torsemide (DEMADEX) 10 MG tablet Take 1 tablet (10 mg total) by mouth daily. **PT NEEDS APPT FOR FURTHER REFILLS** (Patient taking differently: Take 20 mg by mouth daily. **PT NEEDS APPT FOR FURTHER REFILLS**) 30 tablet 1  . levonorgestrel (MIRENA) 20 MCG/24HR IUD 1 Intra Uterine Device (1 each total) by Intrauterine route once. 1 each 0   No current facility-administered medications for this visit.     Social History  Socioeconomic History  . Marital status: Single    Spouse name: Not on file  . Number of children: Not on file  . Years of education: Not on file  . Highest education level: Not on file  Social Needs  . Financial resource strain: Not on file  . Food insecurity - worry: Not on file  . Food insecurity - inability: Not on file  . Transportation needs - medical: Not on file  . Transportation needs - non-medical: Not on file  Occupational History  . Occupation: Sales executive for [employer]: [place]  Tobacco Use  . Smoking status: Never Smoker  . Smokeless tobacco: Never Used  Substance and Sexual Activity  . Alcohol use: No  . Drug use: No  . Sexual activity: Not on file  Other Topics Concern  . Not on file  Social History Narrative  .  Not on file    Family History  Problem Relation Age of Onset  . Diabetes Mother   . Arthritis Mother   . Hypertension Mother   . Arthritis Father   . Cancer Father        Prostate  . Dementia Father   . Alzheimer's disease Father   . Cancer Sister        Lymphoma  . Arthritis Sister   . Arthritis Brother   . Cancer Sister        Peritoneal  . Arthritis Sister   . Arthritis Sister   . Arthritis Sister   . Arthritis Brother   . Diabetes Other        Sibling  . Diabetes Other        Sibling      Thereasa Solo, MD 11/02/2017, 1:22 PM

## 2017-11-02 NOTE — Patient Instructions (Addendum)
Please notify Dr Denman George if you develop bleeding symptoms.  Dr Denman George is recommending that you meet with the surgeons from [facility] to discuss bariatric surgery options.   For information about Bariatric surgery, call [phone].  Office number for [facility] is [phone].  Follow up with Dr. Denman George in June 2019 or sooner if needed.

## 2018-01-13 DIAGNOSIS — J029 Acute pharyngitis, unspecified: Secondary | ICD-10-CM | POA: Diagnosis not present

## 2018-01-13 DIAGNOSIS — J209 Acute bronchitis, unspecified: Secondary | ICD-10-CM | POA: Diagnosis not present

## 2018-02-06 DIAGNOSIS — E210 Primary hyperparathyroidism: Secondary | ICD-10-CM | POA: Diagnosis not present

## 2018-02-06 DIAGNOSIS — M8580 Other specified disorders of bone density and structure, unspecified site: Secondary | ICD-10-CM | POA: Diagnosis not present

## 2018-02-06 DIAGNOSIS — E119 Type 2 diabetes mellitus without complications: Secondary | ICD-10-CM | POA: Diagnosis not present

## 2018-02-06 DIAGNOSIS — Z794 Long term (current) use of insulin: Secondary | ICD-10-CM | POA: Diagnosis not present

## 2018-02-07 ENCOUNTER — Encounter: Payer: Self-pay | Admitting: Gynecologic Oncology

## 2018-02-07 ENCOUNTER — Inpatient Hospital Stay: Payer: BLUE CROSS/BLUE SHIELD | Attending: Gynecologic Oncology | Admitting: Gynecologic Oncology

## 2018-02-07 VITALS — BP 132/56 | HR 83 | Temp 98.2°F | Resp 20 | Ht 64.0 in | Wt 326.0 lb

## 2018-02-07 DIAGNOSIS — Z975 Presence of (intrauterine) contraceptive device: Secondary | ICD-10-CM | POA: Diagnosis not present

## 2018-02-07 DIAGNOSIS — C541 Malignant neoplasm of endometrium: Secondary | ICD-10-CM | POA: Diagnosis not present

## 2018-02-07 NOTE — Progress Notes (Signed)
Follow-up Note: Gyn-Onc   Stacey Wise 61 y.o. female  Chief Complaint  Patient presents with  . Endometrial cancer [facility])    Assessment :Grade 1 endometrial adenocarcinoma. Morbid obesity and other comorbid medical conditions including diabetes hypertension and hypercalcemia. Currently asymptomatic with a Mirena IUD in place. No bleeding, nor further success with weight loss. Remains non-operative candidate for deep pelvic surgery. I explained that the issue was exposure to the deep pelvic organs is limited with such extensive visceral adiposity. We again discussed the option of bariatric surgery (however she cannot afford this).   Plan:  Continue attempts at weight loss. I will see her back in 3 months to re-evaluate weight loss efforts (<300lbs). If she is heading in the right direction, we will consider scheduling surgery (hysterectomy). She would require a minilaparotomy for specimen delivery. We will obtain an [name] to better delineate uterine size and obtain an A11C  HPI: 62 year old Afro-American female seen in consultation request of Dr. Vanessa Kick regarding management of a newly diagnosed endometrial cancer. The patient's had a long history of irregular bleeding which is gradually become heavier. Patient underwent hysteroscopy and endometrial biopsy showing a grade 1 endometrial adenocarcinoma. Subsequently the patient's had minimal bleeding. Ultrasound showing the patient has fibroids.  Patient denies any past gynecologic history. She has never been pregnant.  Patient has number of morbid conditions including a BMI of 58, hypertension, diabetes, and hypercalcemia.  Management options were considered and we decided to treat using a Mirena IUD which was placed in November 2017.  Following initial consultation the patient had a Mirena IUD placed. She's had no difficulty with the IUD. She denies any pelvic pain pressure or any vaginal bleeding.  The patient underwent D&C on  01/19/17. Mirena IUD was kept in situ during the procedure.  The pathology confirmed persistent grade1-2 endometrial cancer.  She met with diabetes nutritionist and educator, Jeanie Sewer, and has a nutrition plan and she lost 20lbs (approx) in 2 months in 2018.  Interval history:  Her A1c is up to 8.3 (from 7.7).  She has lost 1 lbs in the past 3 months   She has had no further bleeding since her D&C.  Review of Systems:10 point review of systems is negative except as noted in interval history.   Vitals: Blood pressure (!) 132/56, pulse 83, temperature 98.2 F (36.8 C), temperature source Oral, resp. rate 20, height '5\' 4"'$  (1.626 m), weight (!) 326 lb (147.9 kg), last menstrual period 06/23/2016, SpO2 100 %.  Physical Exam: General : The patient is a healthy woman in no acute distress.  HEENT: normocephalic, extraoccular movements normal; neck is supple without thyromegally  Lynphnodes: Supraclavicular and inguinal nodes not enlarged  Abdomen: Morbidly obese, Soft, non-tender, no ascites, no organomegally, no masses, no hernias  Pelvic:  EGBUS: Normal female  Vagina: Normal, no lesions  Urethra and Bladder: Normal, non-tender  Cervix: I'm unable to see the IUD string but unable to fully visualize the cervix despite using a very long speculum. Unable to palpate the cervix adequately in order to obtain manual biopsy.  Uterus: Cannot be assessed given the patient's habitus. Bi-manual examination: Non-tender; no adenxal masses or nodularity  Rectal: normal sphincter tone, no masses, no blood  Lower extremities: No edema or varicosities. Normal range of motion      Allergies  Allergen Reactions  . No Known Allergies     Past Medical History:  Diagnosis Date  . Arthritis   . Complication of anesthesia  hard to wake up from parathyroidectomy  . Diabetes mellitus without complication ([place])    per patient " i am a type 2 diabetic"  . DIABETIC PERIPHERAL NEUROPATHY 02/28/2007   . DYSLIPIDEMIA 02/28/2007  . Family history of anesthesia complication    sister hard to wake up  . Heel spur, right   . Hepatitis    "states she has only been vaccinated but never had"  . HYPERTENSION 02/28/2007  . Morbid obesity ([place]) 02/28/2007  . Pneumonia    x2  . SLEEP APNEA 02/28/2007   does not use a cpap-could not use  . Trigger finger, left ring finger   . Uterine fibroid     Past Surgical History:  Procedure Laterality Date  . BREAST LUMPECTOMY WITH NEEDLE LOCALIZATION Left 01/01/2013   Procedure: LEFT BREAST LUMPECTOMY WITH NEEDLE LOCALIZATION;  Surgeon: Marcello Moores A. Cornett, MD;  Location: [facility];  Service: General;  Laterality: Left;  . CERVICAL BIOPSY  W/ LOOP ELECTRODE EXCISION    . CERVICAL POLYPECTOMY N/A 06/27/2016   Procedure: CERVICAL POLYPECTOMY;  Surgeon: Vanessa Kick, MD;  Location: [facility];  Service: Gynecology;  Laterality: N/A;  . DILATION AND CURETTAGE OF UTERUS  2009  . DILATION AND CURETTAGE OF UTERUS N/A 06/27/2016   Procedure: DILATATION AND CURETTAGE;  Surgeon: Vanessa Kick, MD;  Location: [facility];  Service: Gynecology;  Laterality: N/A;  . DILATION AND CURETTAGE OF UTERUS N/A 01/19/2017   Procedure: DILATATION AND CURETTAGE;  Surgeon: Everitt Amber, MD;  Location: [facility];  Service: Gynecology;  Laterality: N/A;  . ELECTROCARDIOGRAM  11/19/2004  . HYSTEROSCOPY N/A 06/27/2016   Procedure: HYSTEROSCOPY;  Surgeon: Vanessa Kick, MD;  Location: [facility];  Service: Gynecology;  Laterality: N/A;  . PARATHYROIDECTOMY Left 06/30/2015   Procedure: LEFT PARATHYROIDECTOMY;  Surgeon: Armandina Gemma, MD;  Location: [facility];  Service: General;  Laterality: Left;  . TUBAL LIGATION  1996    Current Outpatient Medications  Medication Sig Dispense Refill  . aspirin 81 MG tablet Take 81 mg by mouth daily.    [name] atorvastatin (LIPITOR) 20 MG tablet Take 20 mg by mouth at bedtime.     [name] diltiazem (TIAZAC) 300 MG 24 hr capsule TAKE ONE CAPSULE EVERY DAY 90 capsule 3  .  hydrALAZINE (APRESOLINE) 25 MG tablet Take 25 mg by mouth 2 (two) times daily.  0  . insulin lispro protamine-lispro (HUMALOG 75/25 MIX) (75-25) 100 UNIT/ML SUSP injection Inject into the skin. Humalog 75/25 mix, take 80units in the morning and 24units in the evening.    [name] losartan (COZAAR) 100 MG tablet Take 100 mg by mouth daily.    . metFORMIN (GLUCOPHAGE-XR) 500 MG 24 hr tablet Take 1,500 mg by mouth daily.   4  . torsemide (DEMADEX) 10 MG tablet Take 1 tablet (10 mg total) by mouth daily. **PT NEEDS APPT FOR FURTHER REFILLS** (Patient taking differently: Take 20 mg by mouth daily. **PT NEEDS APPT FOR FURTHER REFILLS**) 30 tablet 1  . levonorgestrel (MIRENA) 20 MCG/24HR IUD 1 Intra Uterine Device (1 each total) by Intrauterine route once. 1 each 0   No current facility-administered medications for this visit.     Social History   Socioeconomic History  . Marital status: Single    Spouse name: Not on file  . Number of children: Not on file  . Years of education: Not on file  . Highest education level: Not on file  Occupational History  . Occupation: Sales executive for [employer]  Employer: [employer]  Social Needs  . Financial resource strain: Not on file  . Food insecurity:    Worry: Not on file    Inability: Not on file  . Transportation needs:    Medical: Not on file    Non-medical: Not on file  Tobacco Use  . Smoking status: Never Smoker  . Smokeless tobacco: Never Used  Substance and Sexual Activity  . Alcohol use: No  . Drug use: No  . Sexual activity: Not on file  Lifestyle  . Physical activity:    Days per week: Not on file    Minutes per session: Not on file  . Stress: Not on file  Relationships  . Social connections:    Talks on phone: Not on file    Gets together: Not on file    Attends religious service: Not on file    Active member of club or organization: Not on file    Attends meetings of clubs or organizations: Not on file     Relationship status: Not on file  . Intimate partner violence:    Fear of current or ex partner: Not on file    Emotionally abused: Not on file    Physically abused: Not on file    Forced sexual activity: Not on file  Other Topics Concern  . Not on file  Social History Narrative  . Not on file    Family History  Problem Relation Age of Onset  . Diabetes Mother   . Arthritis Mother   . Hypertension Mother   . Arthritis Father   . Cancer Father        Prostate  . Dementia Father   . Alzheimer's disease Father   . Cancer Sister        Lymphoma  . Arthritis Sister   . Arthritis Brother   . Cancer Sister        Peritoneal  . Arthritis Sister   . Arthritis Sister   . Arthritis Sister   . Arthritis Brother   . Diabetes Other        Sibling  . Diabetes Other        Sibling      Thereasa Solo, MD 02/07/2018, 1:32 PM

## 2018-02-07 NOTE — Patient Instructions (Signed)
.  Dr Denman George will see you back in 3 months to be evaluated for possible surgery. An ultrasound and HbA1c will be drawn prior to that visit.

## 2018-02-14 DIAGNOSIS — E119 Type 2 diabetes mellitus without complications: Secondary | ICD-10-CM | POA: Diagnosis not present

## 2018-02-14 DIAGNOSIS — Z5181 Encounter for therapeutic drug level monitoring: Secondary | ICD-10-CM | POA: Diagnosis not present

## 2018-02-14 DIAGNOSIS — M8580 Other specified disorders of bone density and structure, unspecified site: Secondary | ICD-10-CM | POA: Diagnosis not present

## 2018-02-14 DIAGNOSIS — Z794 Long term (current) use of insulin: Secondary | ICD-10-CM | POA: Diagnosis not present

## 2018-04-24 DIAGNOSIS — E119 Type 2 diabetes mellitus without complications: Secondary | ICD-10-CM | POA: Diagnosis not present

## 2018-04-25 DIAGNOSIS — E559 Vitamin D deficiency, unspecified: Secondary | ICD-10-CM | POA: Diagnosis not present

## 2018-05-09 ENCOUNTER — Ambulatory Visit (HOSPITAL_COMMUNITY)
Admission: RE | Admit: 2018-05-09 | Discharge: 2018-05-09 | Disposition: A | Discharge status: Home / Self Care | Payer: BLUE CROSS/BLUE SHIELD | Source: Ambulatory Visit | Attending: Gynecologic Oncology | Admitting: Gynecologic Oncology

## 2018-05-09 ENCOUNTER — Inpatient Hospital Stay: Payer: BLUE CROSS/BLUE SHIELD | Attending: Gynecologic Oncology

## 2018-05-09 DIAGNOSIS — Z6841 Body Mass Index (BMI) 40.0 and over, adult: Secondary | ICD-10-CM | POA: Diagnosis not present

## 2018-05-09 DIAGNOSIS — Z975 Presence of (intrauterine) contraceptive device: Secondary | ICD-10-CM | POA: Diagnosis not present

## 2018-05-09 DIAGNOSIS — D259 Leiomyoma of uterus, unspecified: Secondary | ICD-10-CM | POA: Diagnosis not present

## 2018-05-09 DIAGNOSIS — E119 Type 2 diabetes mellitus without complications: Secondary | ICD-10-CM | POA: Diagnosis not present

## 2018-05-09 DIAGNOSIS — E669 Obesity, unspecified: Secondary | ICD-10-CM | POA: Diagnosis not present

## 2018-05-09 DIAGNOSIS — C541 Malignant neoplasm of endometrium: Secondary | ICD-10-CM | POA: Insufficient documentation

## 2018-05-09 LAB — HEMOGLOBIN A1C
Hgb A1c MFr Bld: 7.6 % — ABNORMAL HIGH (ref 4.8–5.6)
Mean Plasma Glucose: 171.42 mg/dL

## 2018-05-11 ENCOUNTER — Inpatient Hospital Stay (HOSPITAL_BASED_OUTPATIENT_CLINIC_OR_DEPARTMENT_OTHER): Payer: BLUE CROSS/BLUE SHIELD | Admitting: Gynecologic Oncology

## 2018-05-11 ENCOUNTER — Encounter: Payer: Self-pay | Admitting: Gynecologic Oncology

## 2018-05-11 VITALS — BP 147/69 | HR 88 | Temp 98.3°F | Resp 18 | Ht 64.0 in | Wt 326.3 lb

## 2018-05-11 DIAGNOSIS — E669 Obesity, unspecified: Secondary | ICD-10-CM | POA: Diagnosis not present

## 2018-05-11 DIAGNOSIS — C541 Malignant neoplasm of endometrium: Secondary | ICD-10-CM | POA: Diagnosis not present

## 2018-05-11 DIAGNOSIS — Z975 Presence of (intrauterine) contraceptive device: Secondary | ICD-10-CM

## 2018-05-11 DIAGNOSIS — Z6841 Body Mass Index (BMI) 40.0 and over, adult: Secondary | ICD-10-CM | POA: Diagnosis not present

## 2018-05-11 DIAGNOSIS — E119 Type 2 diabetes mellitus without complications: Secondary | ICD-10-CM

## 2018-05-11 NOTE — Patient Instructions (Signed)
Please call Dr Serita Grit office at [phone] after October, 2019 to make an appointment to see her in January, 2020.  Try weightwatchers online (google this) to enquire about a weight loss program that does not require visits.

## 2018-05-11 NOTE — Progress Notes (Signed)
Follow-up Note: Gyn-Onc   Stacey Wise 62 y.o. female  Chief Complaint  Patient presents with  . Endometrial cancer [facility])    Assessment :Grade 1 endometrial adenocarcinoma. Morbid obesity and other comorbid medical conditions including diabetes hypertension and hypercalcemia. Currently asymptomatic with a Mirena IUD in place. No bleeding, nor further success with weight loss. Remains non-operative candidate for deep pelvic surgery. I explained that the issue was exposure to the deep pelvic organs is limited with such extensive visceral adiposity and her airway was compromised during the last anesthesia (difficult intubation). We again discussed the option of bariatric surgery (however she cannot afford this).   Plan:  Continue attempts at weight loss. Recommended weight watchers. I will see her back in 4 months to re-evaluate weight loss efforts (<300lbs). If she is heading in the right direction, we will consider scheduling surgery (hysterectomy). She would require a minilaparotomy for specimen delivery.  HPI: 62 year old Afro-American female seen in consultation request of Dr. Vanessa Kick regarding management of a newly diagnosed endometrial cancer. The patient's had a long history of irregular bleeding which is gradually become heavier. Patient underwent hysteroscopy and endometrial biopsy showing a grade 1 endometrial adenocarcinoma. Subsequently the patient's had minimal bleeding. Ultrasound showing the patient has fibroids.  Patient denies any past gynecologic history. She has never been pregnant.  Patient has number of morbid conditions including a BMI of 58, hypertension, diabetes, and hypercalcemia.  Management options were considered and we decided to treat using a Mirena IUD which was placed in November 2017.  Following initial consultation the patient had a Mirena IUD placed. She's had no difficulty with the IUD. She denies any pelvic pain pressure or any vaginal  bleeding.  The patient underwent D&C on 01/19/17. Mirena IUD was kept in situ during the procedure.  The pathology confirmed persistent grade1-2 endometrial cancer.  She met with diabetes nutritionist and educator, Jeanie Sewer, and has a nutrition plan and she lost 20lbs (approx) in 2 months in 2018.  Interval history:  Her A1c is stable at 7.6.  She has had no further bleeding since her D&C.  No weight loss in past 3 months.  TVUS in September, 2019 showed a bulky 13x6x6cm uterus with fibroids and a 56m endometrium. Unable to see IUD due to acoustic shadowing.   Review of Systems:10 point review of systems is negative except as noted in interval history.   Vitals: Blood pressure (!) 147/69, pulse 88, temperature 98.3 F (36.8 C), temperature source Oral, resp. rate 18, height _0  (1.626 m), weight (!) 326 lb 4.8 oz (148 kg), last menstrual period 06/23/2016, SpO2 99 %.  Physical Exam: General : The patient is a healthy woman in no acute distress.  HEENT: normocephalic, extraoccular movements normal; neck is supple without thyromegally  Lynphnodes: Supraclavicular and inguinal nodes not enlarged  Abdomen: Morbidly obese, Soft, non-tender, no ascites, no organomegally, no masses, no hernias  Pelvic:  EGBUS: Normal female  Vagina: Normal, no lesions  Urethra and Bladder: Normal, non-tender  Cervix: I'm unable to see the IUD string but unable to fully visualize the cervix despite using a very long speculum. Unable to palpate the cervix adequately in order confirm IUD placement. Uterus: Cannot be assessed given the patient's habitus. Bi-manual examination: Non-tender; no adenxal masses or nodularity  Rectal: normal sphincter tone, no masses, no blood  Lower extremities: No edema or varicosities. Normal range of motion      Allergies  Allergen Reactions  . No Known Allergies  Past Medical History:  Diagnosis Date  . Arthritis   . Complication of anesthesia    hard  to wake up from parathyroidectomy  . Diabetes mellitus without complication ([place])    per patient " i am a type 2 diabetic"  . DIABETIC PERIPHERAL NEUROPATHY 02/28/2007  . DYSLIPIDEMIA 02/28/2007  . Family history of anesthesia complication    sister hard to wake up  . Heel spur, right   . Hepatitis    "states she has only been vaccinated but never had"  . HYPERTENSION 02/28/2007  . Morbid obesity ([place]) 02/28/2007  . Pneumonia    x2  . SLEEP APNEA 02/28/2007   does not use a cpap-could not use  . Trigger finger, left ring finger   . Uterine fibroid     Past Surgical History:  Procedure Laterality Date  . BREAST LUMPECTOMY WITH NEEDLE LOCALIZATION Left 01/01/2013   Procedure: LEFT BREAST LUMPECTOMY WITH NEEDLE LOCALIZATION;  Surgeon: Marcello Moores A. Cornett, MD;  Location: [facility];  Service: General;  Laterality: Left;  . CERVICAL BIOPSY  W/ LOOP ELECTRODE EXCISION    . CERVICAL POLYPECTOMY N/A 06/27/2016   Procedure: CERVICAL POLYPECTOMY;  Surgeon: Vanessa Kick, MD;  Location: [facility];  Service: Gynecology;  Laterality: N/A;  . DILATION AND CURETTAGE OF UTERUS  2009  . DILATION AND CURETTAGE OF UTERUS N/A 06/27/2016   Procedure: DILATATION AND CURETTAGE;  Surgeon: Vanessa Kick, MD;  Location: [facility];  Service: Gynecology;  Laterality: N/A;  . DILATION AND CURETTAGE OF UTERUS N/A 01/19/2017   Procedure: DILATATION AND CURETTAGE;  Surgeon: Everitt Amber, MD;  [facility];  Service: Gynecology;  Laterality: N/A;  . ELECTROCARDIOGRAM  11/19/2004  . HYSTEROSCOPY N/A 06/27/2016   Procedure: HYSTEROSCOPY;  Surgeon: Vanessa Kick, MD;  Location: [facility];  Service: Gynecology;  Laterality: N/A;  . PARATHYROIDECTOMY Left 06/30/2015   Procedure: LEFT PARATHYROIDECTOMY;  Surgeon: Armandina Gemma, MD;  Location: [facility];  Service: General;  Laterality: Left;  . TUBAL LIGATION  1996    Current Outpatient Medications  Medication Sig Dispense Refill  . aspirin 81 MG tablet Take 81 mg by mouth daily.     [name] atorvastatin (LIPITOR) 20 MG tablet Take 20 mg by mouth at bedtime.     [name] diltiazem (TIAZAC) 300 MG 24 hr capsule TAKE ONE CAPSULE EVERY DAY 90 capsule 3  . hydrALAZINE (APRESOLINE) 25 MG tablet Take 25 mg by mouth 2 (two) times daily.  0  . insulin lispro protamine-lispro (HUMALOG 75/25 MIX) (75-25) 100 UNIT/ML SUSP injection Inject into the skin. Humalog 75/25 mix, take 80units in the morning and 24units in the evening.    [name] losartan (COZAAR) 100 MG tablet Take 100 mg by mouth daily.    . metFORMIN (GLUCOPHAGE-XR) 500 MG 24 hr tablet Take 1,500 mg by mouth daily.   4  . torsemide (DEMADEX) 10 MG tablet Take 1 tablet (10 mg total) by mouth daily. **PT NEEDS APPT FOR FURTHER REFILLS** (Patient taking differently: Take 20 mg by mouth daily. **PT NEEDS APPT FOR FURTHER REFILLS**) 30 tablet 1  . levonorgestrel (MIRENA) 20 MCG/24HR IUD 1 Intra Uterine Device (1 each total) by Intrauterine route once. 1 each 0   No current facility-administered medications for this visit.     Social History   Socioeconomic History  . Marital status: Single    Spouse name: Not on file  . Number of children: Not on file  . Years of education: Not on file  .  Highest education level: Not on file  Occupational History  . Occupation: Sales executive for [employer]: [place]  . Financial resource strain: Not on file  . Food insecurity:    Worry: Not on file    Inability: Not on file  . Transportation needs:    Medical: Not on file    Non-medical: Not on file  Tobacco Use  . Smoking status: Never Smoker  . Smokeless tobacco: Never Used  Substance and Sexual Activity  . Alcohol use: No  . Drug use: No  . Sexual activity: Not on file  Lifestyle  . Physical activity:    Days per week: Not on file    Minutes per session: Not on file  . Stress: Not on file  Relationships  . Social connections:    Talks on phone: Not on file    Gets together: Not on  file    Attends religious service: Not on file    Active member of club or organization: Not on file    Attends meetings of clubs or organizations: Not on file    Relationship status: Not on file  . Intimate partner violence:    Fear of current or ex partner: Not on file    Emotionally abused: Not on file    Physically abused: Not on file    Forced sexual activity: Not on file  Other Topics Concern  . Not on file  Social History Narrative  . Not on file    Family History  Problem Relation Age of Onset  . Diabetes Mother   . Arthritis Mother   . Hypertension Mother   . Arthritis Father   . Cancer Father        Prostate  . Dementia Father   . Alzheimer's disease Father   . Cancer Sister        Lymphoma  . Arthritis Sister   . Arthritis Brother   . Cancer Sister        Peritoneal  . Arthritis Sister   . Arthritis Sister   . Arthritis Sister   . Arthritis Brother   . Diabetes Other        Sibling  . Diabetes Other        Sibling      Thereasa Solo, MD 05/11/2018, 12:59 PM

## 2018-06-25 DIAGNOSIS — Z794 Long term (current) use of insulin: Secondary | ICD-10-CM | POA: Diagnosis not present

## 2018-06-25 DIAGNOSIS — E119 Type 2 diabetes mellitus without complications: Secondary | ICD-10-CM | POA: Diagnosis not present

## 2018-06-25 DIAGNOSIS — F331 Major depressive disorder, recurrent, moderate: Secondary | ICD-10-CM | POA: Diagnosis not present

## 2018-07-05 ENCOUNTER — Telehealth: Payer: Self-pay | Admitting: *Deleted

## 2018-07-05 NOTE — Telephone Encounter (Signed)
Patient called and scheduled an appt for January

## 2018-08-02 DIAGNOSIS — E119 Type 2 diabetes mellitus without complications: Secondary | ICD-10-CM | POA: Diagnosis not present

## 2018-08-02 DIAGNOSIS — E210 Primary hyperparathyroidism: Secondary | ICD-10-CM | POA: Diagnosis not present

## 2018-08-02 DIAGNOSIS — E559 Vitamin D deficiency, unspecified: Secondary | ICD-10-CM | POA: Diagnosis not present

## 2018-08-02 DIAGNOSIS — Z794 Long term (current) use of insulin: Secondary | ICD-10-CM | POA: Diagnosis not present

## 2018-08-02 DIAGNOSIS — M8580 Other specified disorders of bone density and structure, unspecified site: Secondary | ICD-10-CM | POA: Diagnosis not present

## 2018-09-03 ENCOUNTER — Inpatient Hospital Stay: Payer: BLUE CROSS/BLUE SHIELD | Attending: Gynecologic Oncology | Admitting: Gynecologic Oncology

## 2018-09-03 ENCOUNTER — Encounter: Payer: Self-pay | Admitting: Gynecologic Oncology

## 2018-09-03 VITALS — BP 147/72 | HR 90 | Temp 98.6°F | Resp 18 | Ht 64.0 in | Wt 326.0 lb

## 2018-09-03 DIAGNOSIS — Z975 Presence of (intrauterine) contraceptive device: Secondary | ICD-10-CM | POA: Diagnosis not present

## 2018-09-03 DIAGNOSIS — C541 Malignant neoplasm of endometrium: Secondary | ICD-10-CM | POA: Insufficient documentation

## 2018-09-03 NOTE — Patient Instructions (Signed)
Please notify Dr Denman George at phone number [phone] if you notice new vaginal bleeding.   Please contact Dr Serita Grit office (at [phone]) in April, 2020 to request an appointment with her for July, 2020.

## 2018-09-03 NOTE — Progress Notes (Signed)
Follow-up Note: Gyn-Onc   Stacey Wise 63 y.o. female  Chief Complaint  Patient presents with  . Endometrial cancer [facility])    Assessment :Grade 1 endometrial adenocarcinoma. Morbid obesity and other comorbid medical conditions including diabetes hypertension and hypercalcemia. Currently asymptomatic with a Mirena IUD in place. No bleeding, nor further success with weight loss. Is going to try psychologist.  Remains non-operative candidate for deep pelvic surgery. I explained that the issue was exposure to the deep pelvic organs is limited with such extensive visceral adiposity and her airway was compromised during the last anesthesia (difficult intubation). We again discussed the option of bariatric surgery (however she cannot afford this).  Plan:  Continue attempts at weight loss. Recommended  Psychology as was recommended by her PCP.  I will see her back in 6 months to re-evaluate weight loss efforts (<300lbs). If she is heading in the right direction, we will consider scheduling surgery (hysterectomy). She would require a minilaparotomy for specimen delivery (and is at high risk for wound complications given her poorly controlled DM and deep abdominal adiposity).  HPI: 63 year old Afro-American female seen in consultation request of Dr. Vanessa Kick regarding management of a newly diagnosed endometrial cancer. The patient's had a long history of irregular bleeding which is gradually become heavier. Patient underwent hysteroscopy and endometrial biopsy showing a grade 1 endometrial adenocarcinoma. Subsequently the patient's had minimal bleeding. Ultrasound showing the patient has fibroids.  Patient denies any past gynecologic history. She has never been pregnant.  Patient has number of morbid conditions including a BMI of 58, hypertension, diabetes, and hypercalcemia.  Management options were considered and we decided to treat using a Mirena IUD which was placed in November  2017.  Following initial consultation the patient had a Mirena IUD placed. She's had no difficulty with the IUD. She denies any pelvic pain pressure or any vaginal bleeding.  The patient underwent D&C on 01/19/17. Mirena IUD was kept in situ during the procedure.  The pathology confirmed persistent grade1-2 endometrial cancer.  She met with diabetes nutritionist and educator, Jeanie Sewer, and has a nutrition plan and she lost 20lbs (approx) in 2 months in 2018.  Interval history:  She has had no further bleeding since her D&C.  No weight loss in past 7 months.  TVUS in September, 2019 showed a bulky 13x6x6cm uterus with fibroids and a 44m endometrium. Unable to see IUD due to acoustic shadowing.   Her last HbA1c went up to 8.2.   Review of Systems:10 point review of systems is negative except as noted in interval history.   Vitals: Blood pressure (!) 147/72, pulse 90, temperature 98.6 F (37 C), resp. rate 18, height 5' 4" (1.626 m), weight (!) 326 lb (147.9 kg), last menstrual period 06/23/2016.  Physical Exam: General : The patient is a healthy woman in no acute distress.  HEENT: normocephalic, extraoccular movements normal; neck is supple without thyromegally  Lynphnodes: Supraclavicular and inguinal nodes not enlarged  Abdomen: Morbidly obese, Soft, non-tender, no ascites, no organomegally, no masses, no hernias  Pelvic:  EGBUS: Normal female  Vagina: Normal, no lesions  Urethra and Bladder: Normal, non-tender  Cervix: I'm able to see the IUD strings but unable to fully visualize the cervix despite using a very long speculum. Unable to palpate the cervix. Uterus: Cannot be assessed given the patient's habitus. Bi-manual examination: Non-tender; no adenxal masses or nodularity  Rectal: normal sphincter tone, no masses, no blood  Lower extremities: No edema or varicosities. Normal range of  motion      Allergies  Allergen Reactions  . No Known Allergies     Past  Medical History:  Diagnosis Date  . Arthritis   . Complication of anesthesia    hard to wake up from parathyroidectomy  . Diabetes mellitus without complication ([place])    per patient " i am a type 2 diabetic"  . DIABETIC PERIPHERAL NEUROPATHY 02/28/2007  . DYSLIPIDEMIA 02/28/2007  . Family history of anesthesia complication    sister hard to wake up  . Heel spur, right   . Hepatitis    "states she has only been vaccinated but never had"  . HYPERTENSION 02/28/2007  . Morbid obesity ([place]) 02/28/2007  . Pneumonia    x2  . SLEEP APNEA 02/28/2007   does not use a cpap-could not use  . Trigger finger, left ring finger   . Uterine fibroid     Past Surgical History:  Procedure Laterality Date  . BREAST LUMPECTOMY WITH NEEDLE LOCALIZATION Left 01/01/2013   Procedure: LEFT BREAST LUMPECTOMY WITH NEEDLE LOCALIZATION;  Surgeon: Marcello Moores A. Cornett, MD;  Location: [place];  Service: General;  Laterality: Left;  . CERVICAL BIOPSY  W/ LOOP ELECTRODE EXCISION    . CERVICAL POLYPECTOMY N/A 06/27/2016   Procedure: CERVICAL POLYPECTOMY;  Surgeon: Vanessa Kick, MD;  Location: [place] [facility];  Service: Gynecology;  Laterality: N/A;  . DILATION AND CURETTAGE OF UTERUS  2009  . DILATION AND CURETTAGE OF UTERUS N/A 06/27/2016   Procedure: DILATATION AND CURETTAGE;  Surgeon: Vanessa Kick, MD;  Location: [facility];  Service: Gynecology;  Laterality: N/A;  . DILATION AND CURETTAGE OF UTERUS N/A 01/19/2017   Procedure: DILATATION AND CURETTAGE;  Surgeon: Everitt Amber, MD;  Location: [facility];  Service: Gynecology;  Laterality: N/A;  . ELECTROCARDIOGRAM  11/19/2004  . HYSTEROSCOPY N/A 06/27/2016   Procedure: HYSTEROSCOPY;  Surgeon: Vanessa Kick, MD;  Location: [facility];  Service: Gynecology;  Laterality: N/A;  . PARATHYROIDECTOMY Left 06/30/2015   Procedure: LEFT PARATHYROIDECTOMY;  Surgeon: Armandina Gemma, MD;  Location: [facility];  Service: General;  Laterality: Left;  . TUBAL LIGATION  1996    Current Outpatient  Medications  Medication Sig Dispense Refill  . aspirin 81 MG tablet Take 81 mg by mouth daily.    [name] atorvastatin (LIPITOR) 20 MG tablet Take 20 mg by mouth at bedtime.     [name] diltiazem (TIAZAC) 300 MG 24 hr capsule TAKE ONE CAPSULE EVERY DAY 90 capsule 3  . hydrALAZINE (APRESOLINE) 25 MG tablet Take 25 mg by mouth 2 (two) times daily.  0  . insulin lispro protamine-lispro (HUMALOG 75/25 MIX) (75-25) 100 UNIT/ML SUSP injection Inject into the skin. Humalog 75/25 mix, take 80units in the morning and 24units in the evening.    [name] losartan (COZAAR) 100 MG tablet Take 100 mg by mouth daily.    . metFORMIN (GLUCOPHAGE-XR) 500 MG 24 hr tablet Take 1,500 mg by mouth daily.   4  . torsemide (DEMADEX) 10 MG tablet Take 1 tablet (10 mg total) by mouth daily. **PT NEEDS APPT FOR FURTHER REFILLS** (Patient taking differently: Take 20 mg by mouth daily. **PT NEEDS APPT FOR FURTHER REFILLS**) 30 tablet 1  . levonorgestrel (MIRENA) 20 MCG/24HR IUD 1 Intra Uterine Device (1 each total) by Intrauterine route once. 1 each 0   No current facility-administered medications for this visit.     Social History   Socioeconomic History  . Marital status: Single    Spouse name: Not on  file  . Number of children: Not on file  . Years of education: Not on file  . Highest education level: Not on file  Occupational History  . Occupation: Quality Control for Industrial Manufacturer    Employer: [employer]  Social Needs  . Financial resource strain: Not on file  . Food insecurity:    Worry: Not on file    Inability: Not on file  . Transportation needs:    Medical: Not on file    Non-medical: Not on file  Tobacco Use  . Smoking status: Never Smoker  . Smokeless tobacco: Never Used  Substance and Sexual Activity  . Alcohol use: No  . Drug use: No  . Sexual activity: Not on file  Lifestyle  . Physical activity:    Days per week: Not on file    Minutes per session: Not on file  . Stress: Not on file   Relationships  . Social connections:    Talks on phone: Not on file    Gets together: Not on file    Attends religious service: Not on file    Active member of club or organization: Not on file    Attends meetings of clubs or organizations: Not on file    Relationship status: Not on file  . Intimate partner violence:    Fear of current or ex partner: Not on file    Emotionally abused: Not on file    Physically abused: Not on file    Forced sexual activity: Not on file  Other Topics Concern  . Not on file  Social History Narrative  . Not on file    Family History  Problem Relation Age of Onset  . Diabetes Mother   . Arthritis Mother   . Hypertension Mother   . Arthritis Father   . Cancer Father        Prostate  . Dementia Father   . Alzheimer's disease Father   . Cancer Sister        Lymphoma  . Arthritis Sister   . Arthritis Brother   . Cancer Sister        Peritoneal  . Arthritis Sister   . Arthritis Sister   . Arthritis Sister   . Arthritis Brother   . Diabetes Other        Sibling  . Diabetes Other        Sibling      Emma C Rossi, MD 09/03/2018, 12:29 PM          

## 2018-10-26 DIAGNOSIS — F331 Major depressive disorder, recurrent, moderate: Secondary | ICD-10-CM | POA: Diagnosis not present

## 2018-10-26 DIAGNOSIS — E119 Type 2 diabetes mellitus without complications: Secondary | ICD-10-CM | POA: Diagnosis not present

## 2018-10-26 DIAGNOSIS — Z794 Long term (current) use of insulin: Secondary | ICD-10-CM | POA: Diagnosis not present

## 2018-11-06 ENCOUNTER — Ambulatory Visit
Admission: RE | Admit: 2018-11-06 | Discharge: 2018-11-06 | Disposition: A | Discharge status: Home / Self Care | Payer: BLUE CROSS/BLUE SHIELD | Source: Ambulatory Visit | Attending: Internal Medicine | Admitting: Internal Medicine

## 2018-11-06 ENCOUNTER — Other Ambulatory Visit: Payer: Self-pay | Admitting: Pediatric Pulmonology

## 2018-11-06 ENCOUNTER — Other Ambulatory Visit: Payer: Self-pay | Admitting: Internal Medicine

## 2018-11-06 DIAGNOSIS — M79641 Pain in right hand: Secondary | ICD-10-CM | POA: Diagnosis not present

## 2018-11-06 DIAGNOSIS — Z23 Encounter for immunization: Secondary | ICD-10-CM | POA: Diagnosis not present

## 2018-11-06 DIAGNOSIS — E210 Primary hyperparathyroidism: Secondary | ICD-10-CM | POA: Diagnosis not present

## 2018-11-06 DIAGNOSIS — M8580 Other specified disorders of bone density and structure, unspecified site: Secondary | ICD-10-CM | POA: Diagnosis not present

## 2018-11-06 DIAGNOSIS — E119 Type 2 diabetes mellitus without complications: Secondary | ICD-10-CM | POA: Diagnosis not present

## 2018-11-06 DIAGNOSIS — E559 Vitamin D deficiency, unspecified: Secondary | ICD-10-CM | POA: Diagnosis not present

## 2018-11-06 DIAGNOSIS — Z794 Long term (current) use of insulin: Secondary | ICD-10-CM | POA: Diagnosis not present

## 2018-11-06 DIAGNOSIS — S61431A Puncture wound without foreign body of right hand, initial encounter: Secondary | ICD-10-CM

## 2018-11-06 DIAGNOSIS — S6991XA Unspecified injury of right wrist, hand and finger(s), initial encounter: Secondary | ICD-10-CM | POA: Diagnosis not present

## 2018-11-09 DIAGNOSIS — E1165 Type 2 diabetes mellitus with hyperglycemia: Secondary | ICD-10-CM | POA: Diagnosis not present

## 2019-02-22 ENCOUNTER — Ambulatory Visit: Payer: BLUE CROSS/BLUE SHIELD | Admitting: Gynecologic Oncology

## 2019-03-11 DIAGNOSIS — M542 Cervicalgia: Secondary | ICD-10-CM | POA: Diagnosis not present

## 2019-03-11 DIAGNOSIS — M5030 Other cervical disc degeneration, unspecified cervical region: Secondary | ICD-10-CM | POA: Diagnosis not present

## 2019-03-25 DIAGNOSIS — M542 Cervicalgia: Secondary | ICD-10-CM | POA: Diagnosis not present

## 2019-03-25 DIAGNOSIS — M5030 Other cervical disc degeneration, unspecified cervical region: Secondary | ICD-10-CM | POA: Diagnosis not present

## 2019-04-30 IMAGING — DX DG SHOULDER 2+V*L*
3 series · 3 of 3 positions shown · non-contrast
Comparison: None in PACs

CLINICAL DATA: Left shoulder injury during a fall yesterday.
Patient reports the pain to be anterior associated with decreased
range of motion.

EXAM:
LEFT SHOULDER - 2+ VIEW

[dg shoulder left (1 of 3)]
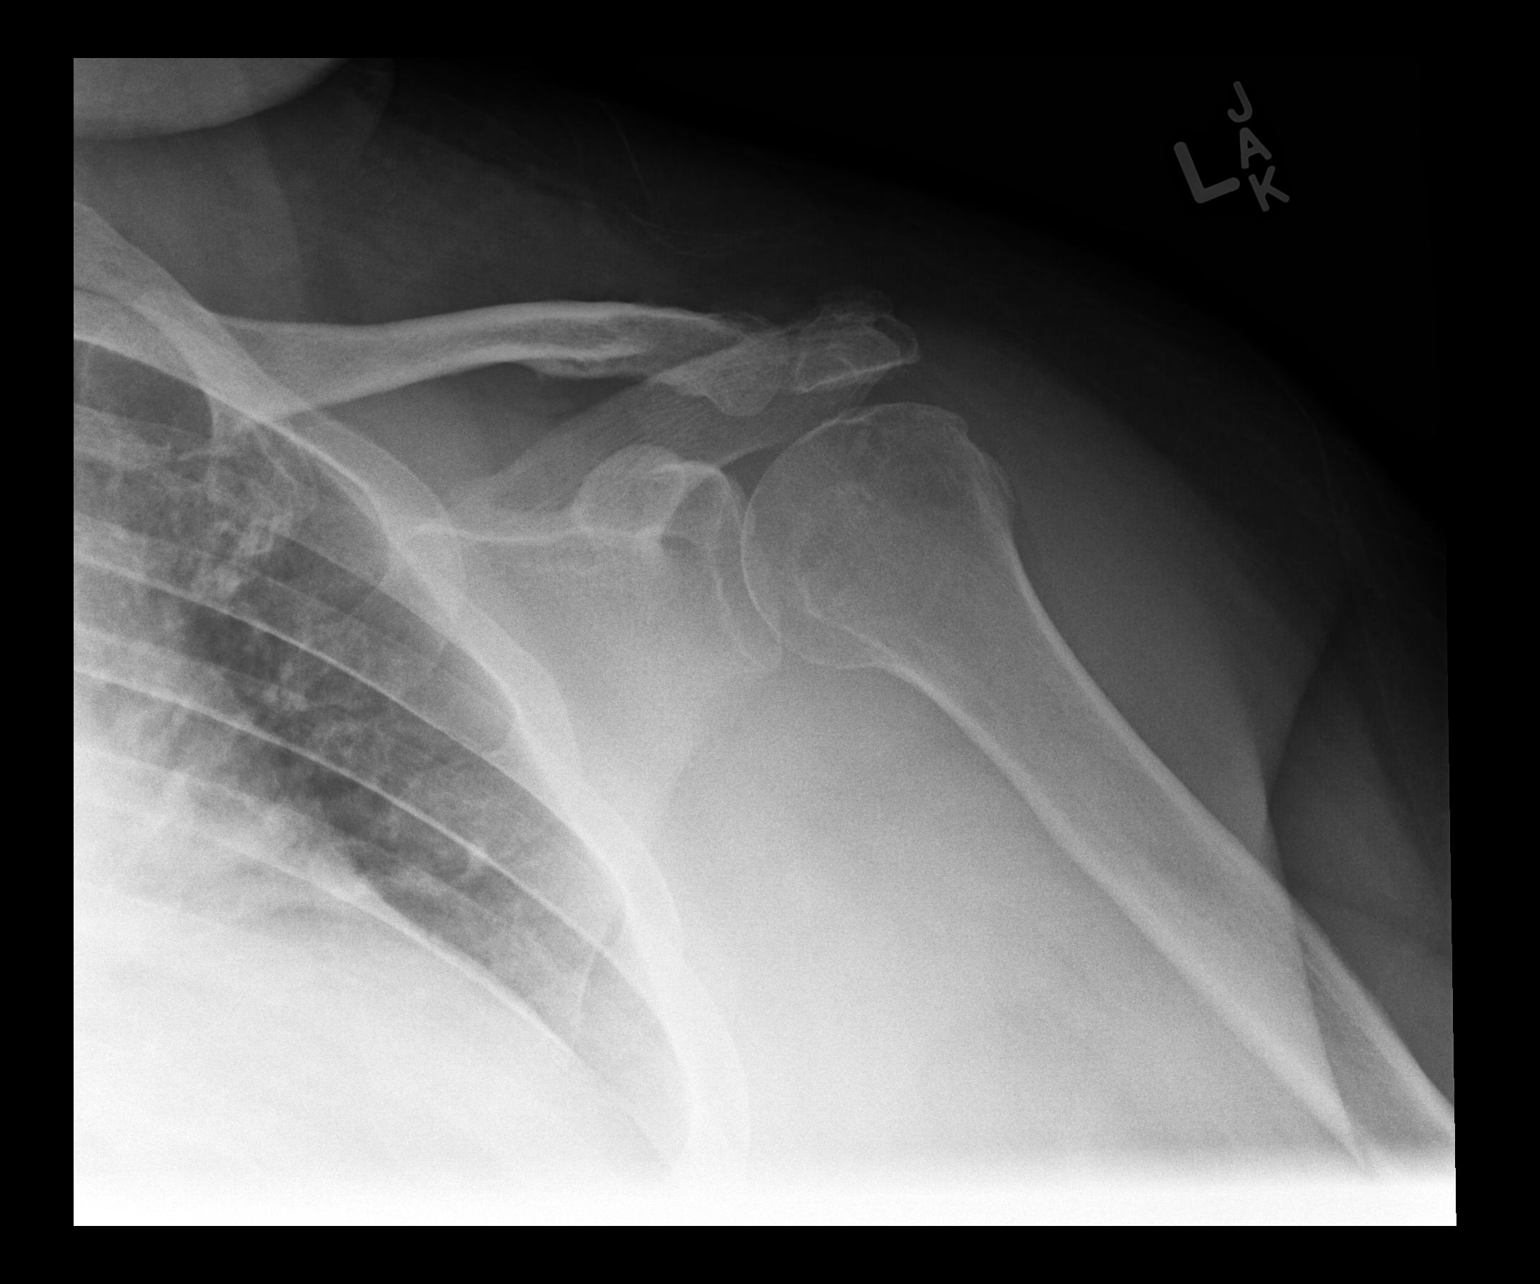

[dg shoulder left (2 of 3)]
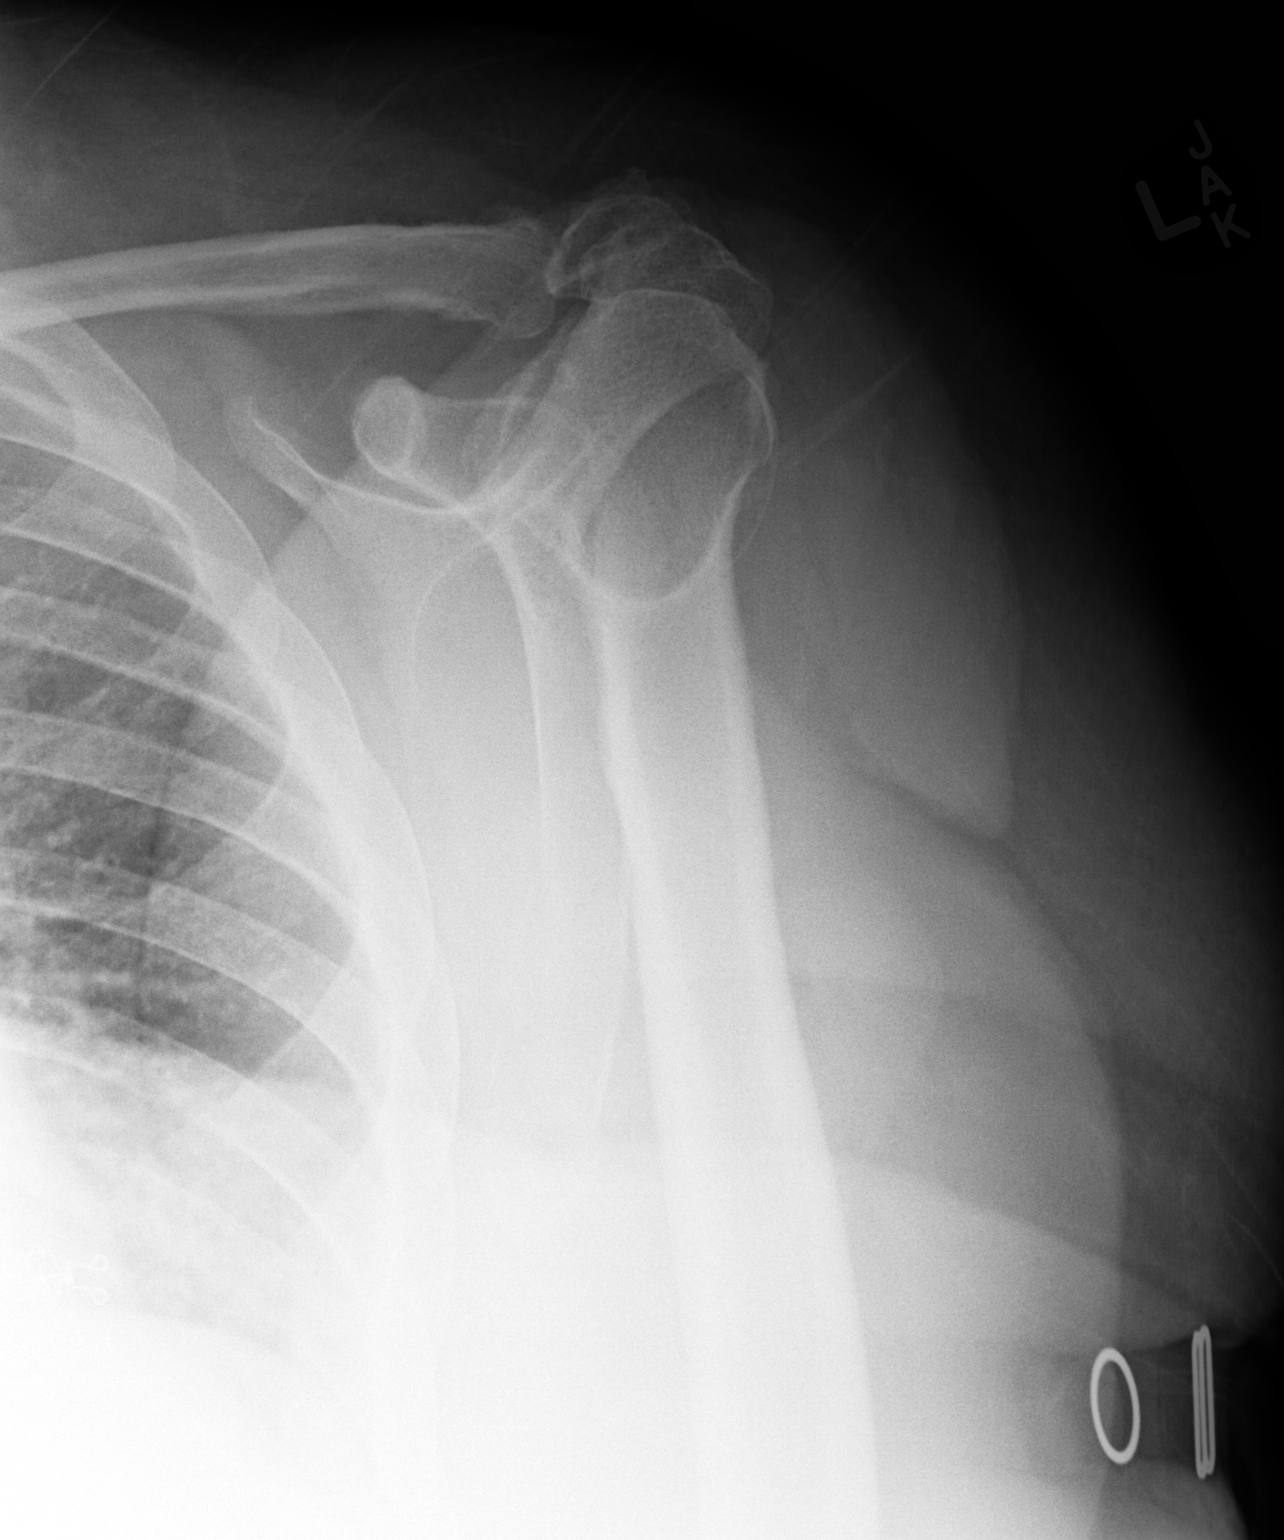

[dg shoulder left (3 of 3)]
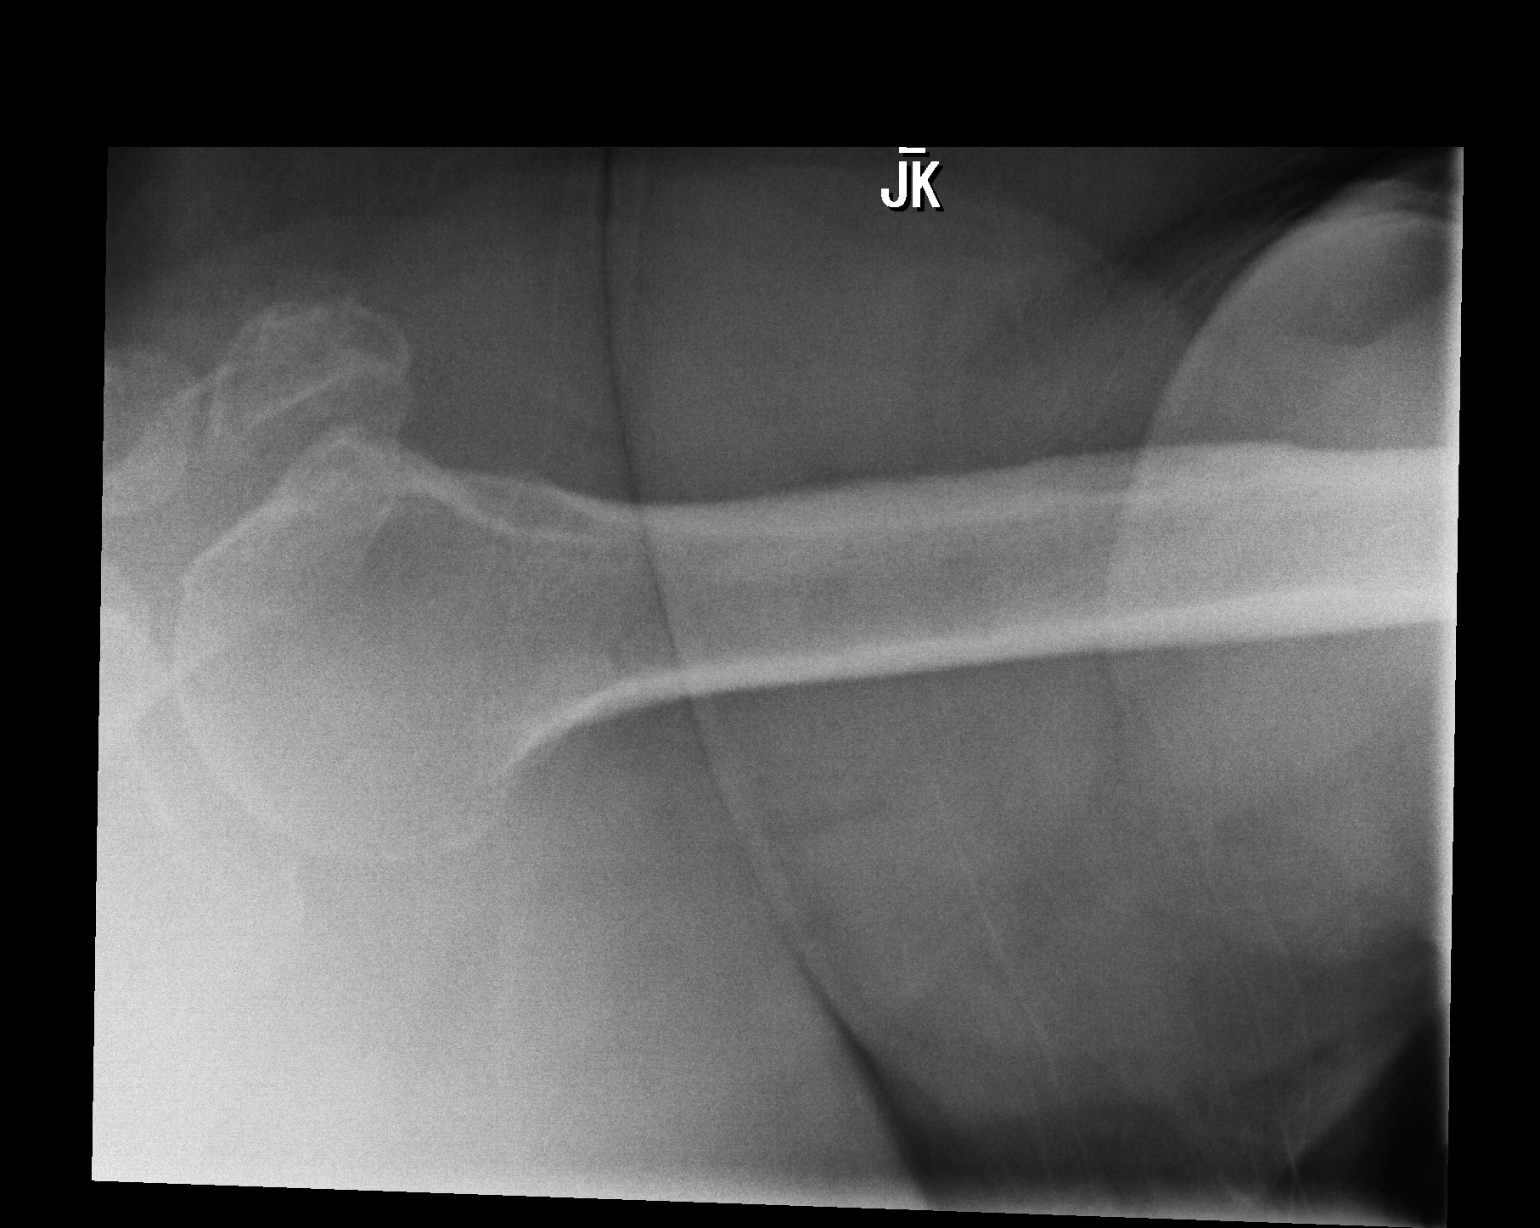

[3 of 3 positions shown; findings below may reference images not displayed]

FINDINGS: The bones are subjectively adequately mineralized. There is no acute
fracture nor dislocation. The glenohumeral and AC joint spaces are
reasonably well-maintained. There is narrowing of the subacromial
subdeltoid space. The observed portions of the left clavicle and
upper left ribs reveal no acute abnormalities.
IMPRESSION: There is mild degenerative narrowing of the subacromial subdeltoid
space. There is no acute fracture nor dislocation.

## 2019-05-16 DIAGNOSIS — E119 Type 2 diabetes mellitus without complications: Secondary | ICD-10-CM | POA: Diagnosis not present

## 2019-05-16 DIAGNOSIS — E559 Vitamin D deficiency, unspecified: Secondary | ICD-10-CM | POA: Diagnosis not present

## 2019-05-16 DIAGNOSIS — E210 Primary hyperparathyroidism: Secondary | ICD-10-CM | POA: Diagnosis not present

## 2019-05-16 DIAGNOSIS — M8580 Other specified disorders of bone density and structure, unspecified site: Secondary | ICD-10-CM | POA: Diagnosis not present

## 2019-05-16 DIAGNOSIS — Z794 Long term (current) use of insulin: Secondary | ICD-10-CM | POA: Diagnosis not present

## 2019-06-04 DIAGNOSIS — I10 Essential (primary) hypertension: Secondary | ICD-10-CM | POA: Diagnosis not present

## 2019-06-04 DIAGNOSIS — Z794 Long term (current) use of insulin: Secondary | ICD-10-CM | POA: Diagnosis not present

## 2019-06-04 DIAGNOSIS — F331 Major depressive disorder, recurrent, moderate: Secondary | ICD-10-CM | POA: Diagnosis not present

## 2019-06-04 DIAGNOSIS — E119 Type 2 diabetes mellitus without complications: Secondary | ICD-10-CM | POA: Diagnosis not present

## 2019-09-18 DIAGNOSIS — M8580 Other specified disorders of bone density and structure, unspecified site: Secondary | ICD-10-CM | POA: Diagnosis not present

## 2019-09-18 DIAGNOSIS — E210 Primary hyperparathyroidism: Secondary | ICD-10-CM | POA: Diagnosis not present

## 2019-09-18 DIAGNOSIS — E559 Vitamin D deficiency, unspecified: Secondary | ICD-10-CM | POA: Diagnosis not present

## 2019-09-18 DIAGNOSIS — E119 Type 2 diabetes mellitus without complications: Secondary | ICD-10-CM | POA: Diagnosis not present

## 2019-09-18 DIAGNOSIS — Z794 Long term (current) use of insulin: Secondary | ICD-10-CM | POA: Diagnosis not present

## 2019-09-20 ENCOUNTER — Other Ambulatory Visit: Payer: Self-pay | Admitting: Internal Medicine

## 2019-09-20 DIAGNOSIS — E2839 Other primary ovarian failure: Secondary | ICD-10-CM

## 2019-09-20 DIAGNOSIS — E559 Vitamin D deficiency, unspecified: Secondary | ICD-10-CM

## 2019-09-20 DIAGNOSIS — E210 Primary hyperparathyroidism: Secondary | ICD-10-CM

## 2019-11-05 DIAGNOSIS — E119 Type 2 diabetes mellitus without complications: Secondary | ICD-10-CM | POA: Diagnosis not present

## 2019-11-05 DIAGNOSIS — I10 Essential (primary) hypertension: Secondary | ICD-10-CM | POA: Diagnosis not present

## 2019-11-05 DIAGNOSIS — Z794 Long term (current) use of insulin: Secondary | ICD-10-CM | POA: Diagnosis not present

## 2019-11-05 DIAGNOSIS — F331 Major depressive disorder, recurrent, moderate: Secondary | ICD-10-CM | POA: Diagnosis not present

## 2019-11-15 ENCOUNTER — Other Ambulatory Visit: Payer: BLUE CROSS/BLUE SHIELD

## 2019-11-15 ENCOUNTER — Other Ambulatory Visit: Payer: Self-pay

## 2019-11-15 ENCOUNTER — Ambulatory Visit
Admission: RE | Admit: 2019-11-15 | Discharge: 2019-11-15 | Disposition: A | Discharge status: Home / Self Care | Payer: BLUE CROSS/BLUE SHIELD | Source: Ambulatory Visit | Attending: Internal Medicine | Admitting: Internal Medicine

## 2019-11-15 DIAGNOSIS — E210 Primary hyperparathyroidism: Secondary | ICD-10-CM

## 2019-11-15 DIAGNOSIS — E559 Vitamin D deficiency, unspecified: Secondary | ICD-10-CM

## 2019-11-15 DIAGNOSIS — E2839 Other primary ovarian failure: Secondary | ICD-10-CM

## 2019-11-15 DIAGNOSIS — Z780 Asymptomatic menopausal state: Secondary | ICD-10-CM | POA: Diagnosis not present

## 2019-11-15 DIAGNOSIS — Z13820 Encounter for screening for osteoporosis: Secondary | ICD-10-CM | POA: Diagnosis not present

## 2019-12-11 ENCOUNTER — Ambulatory Visit: Payer: BC Managed Care – PPO | Attending: Internal Medicine

## 2019-12-11 DIAGNOSIS — Z20822 Contact with and (suspected) exposure to covid-19: Secondary | ICD-10-CM | POA: Diagnosis not present

## 2019-12-12 LAB — NOVEL CORONAVIRUS, NAA: SARS-CoV-2, NAA: NOT DETECTED

## 2019-12-12 LAB — SARS-COV-2, NAA 2 DAY TAT

## 2019-12-16 DIAGNOSIS — E119 Type 2 diabetes mellitus without complications: Secondary | ICD-10-CM | POA: Diagnosis not present

## 2019-12-18 DIAGNOSIS — M8580 Other specified disorders of bone density and structure, unspecified site: Secondary | ICD-10-CM | POA: Diagnosis not present

## 2019-12-18 DIAGNOSIS — Z794 Long term (current) use of insulin: Secondary | ICD-10-CM | POA: Diagnosis not present

## 2019-12-18 DIAGNOSIS — E119 Type 2 diabetes mellitus without complications: Secondary | ICD-10-CM | POA: Diagnosis not present

## 2019-12-20 DIAGNOSIS — E559 Vitamin D deficiency, unspecified: Secondary | ICD-10-CM | POA: Diagnosis not present

## 2019-12-20 DIAGNOSIS — I10 Essential (primary) hypertension: Secondary | ICD-10-CM | POA: Diagnosis not present

## 2019-12-20 DIAGNOSIS — E119 Type 2 diabetes mellitus without complications: Secondary | ICD-10-CM | POA: Diagnosis not present

## 2019-12-20 DIAGNOSIS — E210 Primary hyperparathyroidism: Secondary | ICD-10-CM | POA: Diagnosis not present

## 2019-12-20 DIAGNOSIS — E1165 Type 2 diabetes mellitus with hyperglycemia: Secondary | ICD-10-CM | POA: Diagnosis not present

## 2019-12-20 DIAGNOSIS — F331 Major depressive disorder, recurrent, moderate: Secondary | ICD-10-CM | POA: Diagnosis not present

## 2020-02-21 DIAGNOSIS — E1165 Type 2 diabetes mellitus with hyperglycemia: Secondary | ICD-10-CM | POA: Diagnosis not present

## 2020-02-21 DIAGNOSIS — I10 Essential (primary) hypertension: Secondary | ICD-10-CM | POA: Diagnosis not present

## 2020-02-21 DIAGNOSIS — F331 Major depressive disorder, recurrent, moderate: Secondary | ICD-10-CM | POA: Diagnosis not present

## 2020-02-21 DIAGNOSIS — E119 Type 2 diabetes mellitus without complications: Secondary | ICD-10-CM | POA: Diagnosis not present

## 2020-05-12 ENCOUNTER — Telehealth: Payer: Self-pay | Admitting: *Deleted

## 2020-05-12 ENCOUNTER — Other Ambulatory Visit: Payer: Self-pay | Admitting: Gynecologic Oncology

## 2020-05-12 DIAGNOSIS — N939 Abnormal uterine and vaginal bleeding, unspecified: Secondary | ICD-10-CM

## 2020-05-12 DIAGNOSIS — C541 Malignant neoplasm of endometrium: Secondary | ICD-10-CM

## 2020-05-12 NOTE — Telephone Encounter (Signed)
Patient called requesting an appt to see Dr Denman George. Patient stated "I was to see her last summer but [place] hit. I am now having some issues. Since Friday I have been having bloody discharge. The discharge is bright red but the clots are dark red. It does have an odor. I have no change in my bowels or bladder. I have no bloating, but do have some fullness feeling. I have been using regular pads and changing them every 3-4 hours. I have not been soaking them. I do have some discomfort but no pain." Scheduled the patient to see Dr Denman George on Friday. Also explained that the message would be give to [facility] APP and someone would call her back if we needed her to do anything before the appt.

## 2020-05-12 NOTE — Telephone Encounter (Signed)
Per [facility] APP patient needs an [name] scan scheduled and her MD visit moved. Called and left the patient a message to call the office back. [name] scan scheduled for 9/20 at 2:30 pm. Will move the MD visit once the patient returns my call

## 2020-05-12 NOTE — Progress Notes (Signed)
Patient called with new vaginal bleeding.  Hx endo ca with IUD for treatment.  [name] ordered to evaluate IUD and endometrium.

## 2020-05-12 NOTE — Telephone Encounter (Signed)
Patient returned call and was giving the [name] scan appt date/time. Moved MD visit from 9/17 to 9/29. Patient stated "the bloody discharge was pretty much stopped but I still have some clots." Explained that she needs to call the office back if the bleeding gets worse before her MD visit

## 2020-05-15 ENCOUNTER — Ambulatory Visit: Payer: BC Managed Care – PPO | Admitting: Gynecologic Oncology

## 2020-05-18 ENCOUNTER — Other Ambulatory Visit: Payer: Self-pay | Admitting: Gynecologic Oncology

## 2020-05-18 ENCOUNTER — Other Ambulatory Visit: Payer: Self-pay

## 2020-05-18 ENCOUNTER — Ambulatory Visit (HOSPITAL_COMMUNITY)
Admission: RE | Admit: 2020-05-18 | Discharge: 2020-05-18 | Disposition: A | Discharge status: Home / Self Care | Payer: BC Managed Care – PPO | Source: Ambulatory Visit | Attending: Gynecologic Oncology | Admitting: Gynecologic Oncology

## 2020-05-18 DIAGNOSIS — N939 Abnormal uterine and vaginal bleeding, unspecified: Secondary | ICD-10-CM | POA: Insufficient documentation

## 2020-05-18 DIAGNOSIS — C541 Malignant neoplasm of endometrium: Secondary | ICD-10-CM | POA: Insufficient documentation

## 2020-05-18 DIAGNOSIS — Z8542 Personal history of malignant neoplasm of other parts of uterus: Secondary | ICD-10-CM | POA: Diagnosis not present

## 2020-05-18 DIAGNOSIS — D251 Intramural leiomyoma of uterus: Secondary | ICD-10-CM | POA: Diagnosis not present

## 2020-05-25 ENCOUNTER — Telehealth: Payer: Self-pay | Admitting: *Deleted

## 2020-05-25 NOTE — Telephone Encounter (Signed)
Called and left a message for the patient to call the office back. Patient needs to be scheduled for a lab appt

## 2020-05-26 ENCOUNTER — Ambulatory Visit: Payer: BC Managed Care – PPO | Admitting: Gynecologic Oncology

## 2020-05-26 ENCOUNTER — Other Ambulatory Visit: Payer: Self-pay | Admitting: Gynecologic Oncology

## 2020-05-26 DIAGNOSIS — F331 Major depressive disorder, recurrent, moderate: Secondary | ICD-10-CM | POA: Diagnosis not present

## 2020-05-26 DIAGNOSIS — I10 Essential (primary) hypertension: Secondary | ICD-10-CM | POA: Diagnosis not present

## 2020-05-26 DIAGNOSIS — Z794 Long term (current) use of insulin: Secondary | ICD-10-CM | POA: Diagnosis not present

## 2020-05-26 DIAGNOSIS — E1142 Type 2 diabetes mellitus with diabetic polyneuropathy: Secondary | ICD-10-CM

## 2020-05-26 DIAGNOSIS — E1165 Type 2 diabetes mellitus with hyperglycemia: Secondary | ICD-10-CM | POA: Diagnosis not present

## 2020-05-26 NOTE — Telephone Encounter (Signed)
Patient returned call this morning and was scheduled for a lab appt tomorrow before her MD visit

## 2020-05-27 ENCOUNTER — Other Ambulatory Visit: Payer: Self-pay

## 2020-05-27 ENCOUNTER — Inpatient Hospital Stay (HOSPITAL_BASED_OUTPATIENT_CLINIC_OR_DEPARTMENT_OTHER): Payer: BC Managed Care – PPO | Admitting: Gynecologic Oncology

## 2020-05-27 ENCOUNTER — Inpatient Hospital Stay: Payer: BC Managed Care – PPO | Attending: Gynecologic Oncology

## 2020-05-27 ENCOUNTER — Encounter: Payer: Self-pay | Admitting: Gynecologic Oncology

## 2020-05-27 VITALS — BP 138/59 | HR 93 | Temp 97.3°F | Resp 20 | Wt 324.4 lb

## 2020-05-27 DIAGNOSIS — Z6841 Body Mass Index (BMI) 40.0 and over, adult: Secondary | ICD-10-CM | POA: Diagnosis not present

## 2020-05-27 DIAGNOSIS — Z7982 Long term (current) use of aspirin: Secondary | ICD-10-CM | POA: Insufficient documentation

## 2020-05-27 DIAGNOSIS — C541 Malignant neoplasm of endometrium: Secondary | ICD-10-CM | POA: Insufficient documentation

## 2020-05-27 DIAGNOSIS — E1142 Type 2 diabetes mellitus with diabetic polyneuropathy: Secondary | ICD-10-CM

## 2020-05-27 DIAGNOSIS — E1165 Type 2 diabetes mellitus with hyperglycemia: Secondary | ICD-10-CM | POA: Diagnosis not present

## 2020-05-27 DIAGNOSIS — Z975 Presence of (intrauterine) contraceptive device: Secondary | ICD-10-CM | POA: Insufficient documentation

## 2020-05-27 DIAGNOSIS — E785 Hyperlipidemia, unspecified: Secondary | ICD-10-CM | POA: Insufficient documentation

## 2020-05-27 DIAGNOSIS — I10 Essential (primary) hypertension: Secondary | ICD-10-CM | POA: Insufficient documentation

## 2020-05-27 DIAGNOSIS — Z79899 Other long term (current) drug therapy: Secondary | ICD-10-CM | POA: Diagnosis not present

## 2020-05-27 DIAGNOSIS — Z794 Long term (current) use of insulin: Secondary | ICD-10-CM | POA: Insufficient documentation

## 2020-05-27 LAB — HEMOGLOBIN A1C
Hgb A1c MFr Bld: 8.2 % — ABNORMAL HIGH (ref 4.8–5.6)
Mean Plasma Glucose: 188.64 mg/dL

## 2020-05-27 NOTE — Patient Instructions (Signed)
Dr Denman George is recommending that you work on blood glucose control for the next 2 months. She will recheck your HbA1c in early December.   If your HbA1c is less than 8% you will be considered for a hysterectomy. If the HbA1c is more than 8% you will be considered for a D&C and IUD replacement.  If the HbA1c is less than 8% and you are taken to the OR for a hysterectomy, this may need to be aborted if your lungs do not tolerate the steep head down positioning, or if Dr Denman George cannot visualize the uterus due to deep abdominal contents. If this occurs, she will instead perform a D&C and replacement of the IUD.

## 2020-05-27 NOTE — Progress Notes (Signed)
Follow-up Note: Gyn-Onc   Stacey Wise 64 y.o. female  Chief Complaint  Patient presents with  . Endometrial cancer    follow up    Assessment :Grade 1 endometrial adenocarcinoma. Morbid obesity and other comorbid medical conditions including poorly controlled diabetes hypertension and hypercalcemia. Newly symptomatic with a Mirena IUD in place. Though this is at the "end" of it's therapeutic efficacy.  We are not able to sample in the office due to anatomic constraints.   Her HbA1c is >8%.  Remains a poor operative candidate for deep pelvic surgery. I explained that the issue was exposure to the deep pelvic organs is limited with such extensive visceral adiposity and her airway was compromised during the last anesthesia (difficult intubation).  She requires repeat sampling.  I am recommending that she sees her endocrinologist and works on blood glucose control for the next 2 months. We will recheck HbA1c in 2 months, and if trending down she will be considered for a robotic hysterectomy for a uterus >250gm with BSO and minilaparotomy for specimen delivery, with the caveat being that if her obesity prevents her tolerating steep Trendelenburg or visualization of the pelvic anatomy, hysterectomy will be aborted and we will proceed with D&C and replacement of a new Mirena IUD. She will need anesthesia to weigh in preop due to her history of difficult intubation. We will have Dr Jacelyn Grip weigh in preop regarding preop "clearance"/optimization.   If her recheck HbA1c remains greater than 8% in 2 months, we will proceed instead with a repeat D&C and replacement of a new Mirena IUD.  I will see her back in early December for a plan for reevaluation of hemoglobin A1c and consideration for which surgical option is most appropriate.  Plan:  Continue attempts at weight loss. Recommended  Psychology as was recommended by her PCP.  I will see her back in 6 months to re-evaluate weight loss efforts  (<300lbs). If she is heading in the right direction, we will consider scheduling surgery (hysterectomy). She would require a minilaparotomy for specimen delivery (and is at high risk for wound complications given her poorly controlled DM and deep abdominal adiposity).  HPI: 64 year old Afro-American female seen in consultation request of Dr. Vanessa Kick regarding management of a newly diagnosed endometrial cancer. The patient's had a long history of irregular bleeding which is gradually become heavier. Patient underwent hysteroscopy and endometrial biopsy showing a grade 1 endometrial adenocarcinoma. Subsequently the patient's had minimal bleeding. Ultrasound showing the patient has fibroids.  Patient denies any past gynecologic history. She has never been pregnant.  Patient has number of morbid conditions including a BMI of 58, hypertension, diabetes, and hypercalcemia.  Management options were considered and we decided to treat using a Mirena IUD which was placed in November 2017.  Following initial consultation the patient had a Mirena IUD placed. She's had no difficulty with the IUD. She denies any pelvic pain pressure or any vaginal bleeding.  The patient underwent D&C on 01/19/17. Mirena IUD was kept in situ during the procedure.  The pathology confirmed persistent grade1-2 endometrial cancer.  She met with diabetes nutritionist and educator, Jeanie Sewer, and has a nutrition plan and she lost 20lbs (approx) in 2 months in 2018.  No weight loss in 4 years.  TVUS in September, 2019 showed a bulky 13x6x6cm uterus with fibroids and a 16m endometrium. Unable to see IUD due to acoustic shadowing.   Interval history:  After several years of no vaginal bleeding, she developed an episode  of vaginal bleeding in August, 2021. A TVUS was ordered and performed on May 18, 2020.  This revealed a uterus measuring 8.2 x 5 x 7.2 cm with intramural fibroids the largest of which measuring 5 cm in  the lower uterine segment.  The endometrium could not be visualized due to fibroids.  The IUD was not definitively seen.  The ovaries were not visualized.  Her vaginal spotting stopped within days of beginning.  HbA1c was drawn on 05/19/20 and was elevated at 8.2%.  Review of Systems:10 point review of systems is negative except as noted in interval history.   Vitals: Blood pressure (!) 138/59, pulse 93, temperature (!) 97.3 F (36.3 C), resp. rate 20, weight (!) 324 lb 6.4 oz (147.1 kg), last menstrual period 06/23/2016, SpO2 98 %.  Physical Exam: General : The patient is a healthy woman in no acute distress.  HEENT: normocephalic, extraoccular movements normal; neck is supple without thyromegally  Lynphnodes: Supraclavicular and inguinal nodes not enlarged  Abdomen: Morbidly obese, Soft, non-tender, no ascites, no organomegally, no masses, no hernias  Pelvic:  EGBUS: Normal female  Vagina: Normal, no lesions  Urethra and Bladder: Normal, non-tender  Cervix: I'm able to see the IUD strings but unable to fully visualize the cervix despite using a very long speculum. Unable to palpate the cervix. Uterus: Cannot be assessed given the patient's habitus. Bi-manual examination: Non-tender; no adenxal masses or nodularity  Rectal: normal sphincter tone, no masses, no blood  Lower extremities: No edema or varicosities. Normal range of motion      Allergies  Allergen Reactions  . No Known Allergies     Past Medical History:  Diagnosis Date  . Arthritis   . Complication of anesthesia    hard to wake up from parathyroidectomy  . Diabetes mellitus without complication ([place])    per patient " i am a type 2 diabetic"  . DIABETIC PERIPHERAL NEUROPATHY 02/28/2007  . DYSLIPIDEMIA 02/28/2007  . Family history of anesthesia complication    sister hard to wake up  . Heel spur, right   . Hepatitis    "states she has only been vaccinated but never had"  . HYPERTENSION 02/28/2007  . Morbid obesity  ([place]) 02/28/2007  . Pneumonia    x2  . SLEEP APNEA 02/28/2007   does not use a cpap-could not use  . Trigger finger, left ring finger   . Uterine fibroid     Past Surgical History:  Procedure Laterality Date  . BREAST LUMPECTOMY WITH NEEDLE LOCALIZATION Left 01/01/2013   Procedure: LEFT BREAST LUMPECTOMY WITH NEEDLE LOCALIZATION;  Surgeon: Marcello Moores A. Cornett, MD;  Location: [place];  Service: General;  Laterality: Left;  . CERVICAL BIOPSY  W/ LOOP ELECTRODE EXCISION    . CERVICAL POLYPECTOMY N/A 06/27/2016   Procedure: CERVICAL POLYPECTOMY;  Surgeon: Vanessa Kick, MD;  Location: [facility];  Service: Gynecology;  Laterality: N/A;  . DILATION AND CURETTAGE OF UTERUS  2009  . DILATION AND CURETTAGE OF UTERUS N/A 06/27/2016   Procedure: DILATATION AND CURETTAGE;  Surgeon: Vanessa Kick, MD;  Location: [facility];  Service: Gynecology;  Laterality: N/A;  . DILATION AND CURETTAGE OF UTERUS N/A 01/19/2017   Procedure: DILATATION AND CURETTAGE;  Surgeon: Everitt Amber, MD;  [facility];  Service: Gynecology;  Laterality: N/A;  . ELECTROCARDIOGRAM  11/19/2004  . HYSTEROSCOPY N/A 06/27/2016   Procedure: HYSTEROSCOPY;  Surgeon: Vanessa Kick, MD;  Location: [facility];  Service: Gynecology;  Laterality: N/A;  . PARATHYROIDECTOMY  Left 06/30/2015   Procedure: LEFT PARATHYROIDECTOMY;  Surgeon: Armandina Gemma, MD;  [facility];  Service: General;  Laterality: Left;  . TUBAL LIGATION  1996    Current Outpatient Medications  Medication Sig Dispense Refill  . aspirin 81 MG tablet Take 81 mg by mouth daily.    [name] atorvastatin (LIPITOR) 20 MG tablet Take 20 mg by mouth at bedtime.     [name] diltiazem (TIAZAC) 300 MG 24 hr capsule TAKE ONE CAPSULE EVERY DAY 90 capsule 3  . hydrALAZINE (APRESOLINE) 50 MG tablet Take 50 mg by mouth 3 (three) times daily.    . insulin lispro protamine-lispro (HUMALOG 75/25 MIX) (75-25) 100 UNIT/ML SUSP injection Inject into the skin. Humalog 75/25 mix, take 60units in the  morning and 22units in the evening.    [name] losartan (COZAAR) 100 MG tablet Take 100 mg by mouth daily.    . metFORMIN (GLUCOPHAGE-XR) 500 MG 24 hr tablet Take 500 mg by mouth daily. Take 2 tablets in the am and 1 tablet in the pm  4  . TORSEMIDE PO Take 30 mg by mouth daily in the afternoon.    [name] levonorgestrel (MIRENA) 20 MCG/24HR IUD 1 Intra Uterine Device (1 each total) by Intrauterine route once. 1 each 0   No current facility-administered medications for this visit.    Social History   Socioeconomic History  . Marital status: Single    Spouse name: Not on file  . Number of children: Not on file  . Years of education: Not on file  . Highest education level: Not on file  Occupational History  . Occupation: Sales executive for [employer]: [place]  Tobacco Use  . Smoking status: Never Smoker  . Smokeless tobacco: Never Used  Vaping Use  . Vaping Use: Never used  Substance and Sexual Activity  . Alcohol use: No  . Drug use: No  . Sexual activity: Not on file  Other Topics Concern  . Not on file  Social History Narrative  . Not on file   Social Determinants of Health   Financial Resource Strain:   . Difficulty of Paying Living Expenses: Not on file  Food Insecurity:   . Worried About Charity fundraiser in the Last Year: Not on file  . Ran Out of Food in the Last Year: Not on file  Transportation Needs:   . Lack of Transportation (Medical): Not on file  . Lack of Transportation (Non-Medical): Not on file  Physical Activity:   . Days of Exercise per Week: Not on file  . Minutes of Exercise per Session: Not on file  Stress:   . Feeling of Stress : Not on file  Social Connections:   . Frequency of Communication with Friends and Family: Not on file  . Frequency of Social Gatherings with Friends and Family: Not on file  . Attends Religious Services: Not on file  . Active Member of Clubs or Organizations: Not on file  . Attends Theatre manager Meetings: Not on file  . Marital Status: Not on file  Intimate Partner Violence:   . Fear of Current or Ex-Partner: Not on file  . Emotionally Abused: Not on file  . Physically Abused: Not on file  . Sexually Abused: Not on file    Family History  Problem Relation Age of Onset  . Diabetes Mother   . Arthritis Mother   . Hypertension Mother   . Arthritis Father   .  Cancer Father        Prostate  . Dementia Father   . Alzheimer's disease Father   . Cancer Sister        Lymphoma  . Arthritis Sister   . Arthritis Brother   . Cancer Sister        Peritoneal  . Arthritis Sister   . Arthritis Sister   . Arthritis Sister   . Arthritis Brother   . Diabetes Other        Sibling  . Diabetes Other        Sibling      Thereasa Solo, MD 05/27/2020, 4:03 PM

## 2020-06-18 DIAGNOSIS — Z794 Long term (current) use of insulin: Secondary | ICD-10-CM | POA: Diagnosis not present

## 2020-06-18 DIAGNOSIS — M8580 Other specified disorders of bone density and structure, unspecified site: Secondary | ICD-10-CM | POA: Diagnosis not present

## 2020-06-18 DIAGNOSIS — E1165 Type 2 diabetes mellitus with hyperglycemia: Secondary | ICD-10-CM | POA: Diagnosis not present

## 2020-06-25 DIAGNOSIS — H2512 Age-related nuclear cataract, left eye: Secondary | ICD-10-CM | POA: Diagnosis not present

## 2020-07-20 DIAGNOSIS — M8580 Other specified disorders of bone density and structure, unspecified site: Secondary | ICD-10-CM | POA: Diagnosis not present

## 2020-07-20 DIAGNOSIS — Z794 Long term (current) use of insulin: Secondary | ICD-10-CM | POA: Diagnosis not present

## 2020-07-20 DIAGNOSIS — E1165 Type 2 diabetes mellitus with hyperglycemia: Secondary | ICD-10-CM | POA: Diagnosis not present

## 2020-08-18 DIAGNOSIS — Z961 Presence of intraocular lens: Secondary | ICD-10-CM | POA: Diagnosis not present

## 2020-08-18 DIAGNOSIS — H18413 Arcus senilis, bilateral: Secondary | ICD-10-CM | POA: Diagnosis not present

## 2020-08-18 DIAGNOSIS — H25042 Posterior subcapsular polar age-related cataract, left eye: Secondary | ICD-10-CM | POA: Diagnosis not present

## 2020-08-18 DIAGNOSIS — H2512 Age-related nuclear cataract, left eye: Secondary | ICD-10-CM | POA: Diagnosis not present

## 2020-08-24 NOTE — Progress Notes (Signed)
Follow-up Note: Gyn-Onc   Stacey Wise 64 y.o. female  Chief Complaint  Patient presents with  . Endometrial cancer ([place])    Assessment and Plan :Grade 1 endometrial adenocarcinoma. Morbid obesity and other comorbid medical conditions including poorly controlled diabetes hypertension and hypercalcemia. Newly symptomatic with a Mirena IUD in place. Though this is at the "end" of it's therapeutic efficacy.  She requires repeat sampling (due to new bleeding). We are not able to sample in the office due to anatomic constraints.  She requires either hysterectomy or D&C and replacement of Mirena IUD.  Her HbA1c is >8%. Will follow-up today's value.  Remains a high risk operative candidate for deep pelvic surgery. I explained that the issue was exposure to the deep pelvic organs is limited with such extensive visceral adiposity and her airway was compromised during the last anesthesia (difficult intubation).  If her recheck HbA1c remains greater than 8%, we will proceed instead with a repeat D&C and replacement of a new Mirena IUD.  If HbA1c is <8% she will be offered an attempted robotic hysterectomy for a uterus >250gm with BSO and minilaparotomy for specimen delivery, with the caveat being that if her obesity prevents her tolerating steep Trendelenburg or visualization of the pelvic anatomy, hysterectomy will be aborted and we will proceed with D&C and replacement of a new Mirena IUD. She will need anesthesia to weigh in preop due to her history of difficult intubation. We will have Dr Jacelyn Grip weigh in preop regarding preop "clearance"/optimization.   We will bring her back for the appropriate preoperative testing and counseling with [facility].   HPI: 64 year old Afro-American female seen in consultation request of Dr. Vanessa Kick regarding management of a newly diagnosed endometrial cancer. The patient's had a long history of irregular bleeding which is gradually become heavier. Patient  underwent hysteroscopy and endometrial biopsy showing a grade 1 endometrial adenocarcinoma. Subsequently the patient's had minimal bleeding. Ultrasound showing the patient has fibroids.  Patient denies any past gynecologic history. She has never been pregnant.  Patient has number of morbid conditions including a BMI of 55, hypertension, diabetes, and hypercalcemia.  Management options were considered at the time of diagnosis and we decided to treat using a Mirena IUD which was placed in November 2017.  Following initial consultation the patient had a Mirena IUD placed. She's had no difficulty with the IUD. She denies any pelvic pain pressure or any vaginal bleeding.  The patient underwent D&C on 01/19/17. Mirena IUD was kept in situ during the procedure.  The pathology confirmed persistent grade1-2 endometrial cancer.  She met with diabetes nutritionist and educator, Jeanie Sewer, and has a nutrition plan and she lost 20lbs (approx) in 2 months in 2018.  No weight loss in 4 years.  TVUS in September, 2019 showed a bulky 13x6x6cm uterus with fibroids and a 32m endometrium. Unable to see IUD due to acoustic shadowing.   After several years of no vaginal bleeding, she developed an episode of vaginal bleeding in August, 2021. A TVUS was ordered and performed on May 18, 2020.  This revealed a uterus measuring 8.2 x 5 x 7.2 cm with intramural fibroids the largest of which measuring 5 cm in the lower uterine segment.  The endometrium could not be visualized due to fibroids.  The IUD was not definitively seen.  The ovaries were not visualized.  HbA1c was drawn on 05/19/20 and was elevated at 8.2%.  Interval history:  She met with an endocrinologist and was started on  Trulicity. Her sugars have been lower per patient. She has lost 5 lbs in 2 months.  Her vaginal spotting is lighter.  Review of Systems:10 point review of systems is negative except as noted in interval history.   Vitals:  Blood pressure (!) 145/62, pulse 98, temperature 97.9 F (36.6 C), temperature source Tympanic, resp. rate 17, height '5\' 4"'  (1.626 m), weight (!) 319 lb (144.7 kg), last menstrual period 06/23/2016, SpO2 99 %.  Physical Exam: General : The patient is a healthy woman in no acute distress.  HEENT: normocephalic, extraoccular movements normal; neck is supple without thyromegally  Lynphnodes: Supraclavicular and inguinal nodes not enlarged  Abdomen: Morbidly obese, Soft, non-tender, no ascites, no organomegally, no masses, no hernias  Pelvic:  EGBUS: Normal female  Vagina: Normal, no lesions  Urethra and Bladder: Normal, non-tender   Cervix: unable to see the IUD strings but unable to fully visualize the cervix despite using a very long speculum. Unable to palpate the cervix. It is flush with the vagina.  Uterus: Cannot be assessed given the patient's habitus. Bi-manual examination: Non-tender; no adenxal masses or nodularity  Rectal: normal sphincter tone, no masses, no blood  Lower extremities: No edema or varicosities. Normal range of motion      Allergies  Allergen Reactions  . Empagliflozin-Metformin Hcl     Other reaction(s): headaches and urine frequency  . No Known Allergies     Past Medical History:  Diagnosis Date  . Arthritis   . Complication of anesthesia    hard to wake up from parathyroidectomy  . Diabetes mellitus without complication ([place])    per patient " i am a type 2 diabetic"  . DIABETIC PERIPHERAL NEUROPATHY 02/28/2007  . DYSLIPIDEMIA 02/28/2007  . Family history of anesthesia complication    sister hard to wake up  . Heel spur, right   . Hepatitis    "states she has only been vaccinated but never had"  . HYPERTENSION 02/28/2007  . Morbid obesity ([place]) 02/28/2007  . Pneumonia    x2  . SLEEP APNEA 02/28/2007   does not use a cpap-could not use  . Trigger finger, left ring finger   . Uterine fibroid     Past Surgical History:  Procedure Laterality Date  .  BREAST LUMPECTOMY WITH NEEDLE LOCALIZATION Left 01/01/2013   Procedure: LEFT BREAST LUMPECTOMY WITH NEEDLE LOCALIZATION;  Surgeon: Marcello Moores A. Cornett, MD;  Location: [facility];  Service: General;  Laterality: Left;  . CERVICAL BIOPSY  W/ LOOP ELECTRODE EXCISION    . CERVICAL POLYPECTOMY N/A 06/27/2016   Procedure: CERVICAL POLYPECTOMY;  Surgeon: Vanessa Kick, MD;  Location: [facility];  Service: Gynecology;  Laterality: N/A;  . DILATION AND CURETTAGE OF UTERUS  2009  . DILATION AND CURETTAGE OF UTERUS N/A 06/27/2016   Procedure: DILATATION AND CURETTAGE;  Surgeon: Vanessa Kick, MD;  Location: [facility];  Service: Gynecology;  Laterality: N/A;  . DILATION AND CURETTAGE OF UTERUS N/A 01/19/2017   Procedure: DILATATION AND CURETTAGE;  Surgeon: Everitt Amber, MD;  Location: [facility];  Service: Gynecology;  Laterality: N/A;  . ELECTROCARDIOGRAM  11/19/2004  . HYSTEROSCOPY N/A 06/27/2016   Procedure: HYSTEROSCOPY;  Surgeon: Vanessa Kick, MD;  Location: [facility];  Service: Gynecology;  Laterality: N/A;  . PARATHYROIDECTOMY Left 06/30/2015   Procedure: LEFT PARATHYROIDECTOMY;  Surgeon: Armandina Gemma, MD;  Location: [facility];  Service: General;  Laterality: Left;  . TUBAL LIGATION  1996    Current Outpatient Medications  Medication Sig Dispense  Refill  . aspirin 81 MG tablet Take 81 mg by mouth daily.    [name] atorvastatin (LIPITOR) 20 MG tablet Take 20 mg by mouth at bedtime.     [name] diltiazem (TIAZAC) 300 MG 24 hr capsule TAKE ONE CAPSULE EVERY DAY 90 capsule 3  . hydrALAZINE (APRESOLINE) 50 MG tablet Take 50 mg by mouth 3 (three) times daily.    . insulin lispro protamine-lispro (HUMALOG 75/25 MIX) (75-25) 100 UNIT/ML SUSP injection Inject into the skin. Humalog 75/25 mix, take 60units in the morning and 22units in the evening.    [name] levonorgestrel (MIRENA) 20 MCG/24HR IUD 1 Intra Uterine Device (1 each total) by Intrauterine route once. 1 each 0  . losartan (COZAAR) 100 MG tablet Take 100 mg by mouth daily.     . metFORMIN (GLUCOPHAGE-XR) 500 MG 24 hr tablet Take 500 mg by mouth daily. Take 2 tablets in the am and 1 tablet in the pm  4  . TORSEMIDE PO Take 30 mg by mouth daily in the afternoon.    . TRULICITY 0.13 HY/3.8OI SOPN Inject 0.75 mg into the skin once a week.     No current facility-administered medications for this visit.    Social History   Socioeconomic History  . Marital status: Single    Spouse name: Not on file  . Number of children: Not on file  . Years of education: Not on file  . Highest education level: Not on file  Occupational History  . Occupation: Sales executive for [employer]: [place]  Tobacco Use  . Smoking status: Never Smoker  . Smokeless tobacco: Never Used  Vaping Use  . Vaping Use: Never used  Substance and Sexual Activity  . Alcohol use: No  . Drug use: No  . Sexual activity: Not on file  Other Topics Concern  . Not on file  Social History Narrative  . Not on file   Social Determinants of Health   Financial Resource Strain: Not on file  Food Insecurity: Not on file  Transportation Needs: Not on file  Physical Activity: Not on file  Stress: Not on file  Social Connections: Not on file  Intimate Partner Violence: Not on file    Family History  Problem Relation Age of Onset  . Diabetes Mother   . Arthritis Mother   . Hypertension Mother   . Arthritis Father   . Cancer Father        Prostate  . Dementia Father   . Alzheimer's disease Father   . Cancer Sister        Lymphoma  . Arthritis Sister   . Arthritis Brother   . Cancer Sister        Peritoneal  . Arthritis Sister   . Arthritis Sister   . Arthritis Sister   . Arthritis Brother   . Diabetes Other        Sibling  . Diabetes Other        Sibling      Thereasa Solo, MD 08/26/2020, 9:25 AM

## 2020-08-25 ENCOUNTER — Telehealth: Payer: Self-pay

## 2020-08-25 DIAGNOSIS — C541 Malignant neoplasm of endometrium: Secondary | ICD-10-CM

## 2020-08-25 NOTE — Telephone Encounter (Signed)
TC to review meaningful use, no answer, left message to return call.   

## 2020-08-26 ENCOUNTER — Encounter: Payer: Self-pay | Admitting: Gynecologic Oncology

## 2020-08-26 ENCOUNTER — Inpatient Hospital Stay: Payer: BC Managed Care – PPO | Attending: Gynecologic Oncology | Admitting: Gynecologic Oncology

## 2020-08-26 ENCOUNTER — Inpatient Hospital Stay: Payer: BC Managed Care – PPO

## 2020-08-26 ENCOUNTER — Other Ambulatory Visit: Payer: Self-pay

## 2020-08-26 VITALS — BP 145/62 | HR 98 | Temp 97.9°F | Resp 17 | Ht 64.0 in | Wt 319.0 lb

## 2020-08-26 DIAGNOSIS — Z975 Presence of (intrauterine) contraceptive device: Secondary | ICD-10-CM | POA: Diagnosis not present

## 2020-08-26 DIAGNOSIS — I10 Essential (primary) hypertension: Secondary | ICD-10-CM | POA: Diagnosis not present

## 2020-08-26 DIAGNOSIS — E1165 Type 2 diabetes mellitus with hyperglycemia: Secondary | ICD-10-CM | POA: Diagnosis not present

## 2020-08-26 DIAGNOSIS — Z79899 Other long term (current) drug therapy: Secondary | ICD-10-CM | POA: Insufficient documentation

## 2020-08-26 DIAGNOSIS — E1142 Type 2 diabetes mellitus with diabetic polyneuropathy: Secondary | ICD-10-CM

## 2020-08-26 DIAGNOSIS — C541 Malignant neoplasm of endometrium: Secondary | ICD-10-CM | POA: Insufficient documentation

## 2020-08-26 DIAGNOSIS — Z6841 Body Mass Index (BMI) 40.0 and over, adult: Secondary | ICD-10-CM | POA: Diagnosis not present

## 2020-08-26 DIAGNOSIS — Z794 Long term (current) use of insulin: Secondary | ICD-10-CM | POA: Insufficient documentation

## 2020-08-26 LAB — HEMOGLOBIN A1C
Hgb A1c MFr Bld: 7.3 % — ABNORMAL HIGH (ref 4.8–5.6)
Mean Plasma Glucose: 162.81 mg/dL

## 2020-08-26 NOTE — Patient Instructions (Signed)
Dr Andrey Farmer will check your HbA1c result from today's lab. If this is greater than 8%, she will schedule you for a D&C and replacement of your IUD.  If this is less than 8%, she will schedule you for an attempted robotic hysterectomy with an incision to remove the uterine specimen.  This may need to be aborted, in which case an IUD would be placed during the surgery, if you do not tolerate the positioning and the ability to ventilate the lungs during hysterectomy.  Dr. Oliver Hum office will call you with the results of your hemoglobin A1c and the surgical plan.  They will bring you back for a preoperative visit with [facility].

## 2020-08-27 ENCOUNTER — Other Ambulatory Visit: Payer: Self-pay | Admitting: Gynecologic Oncology

## 2020-08-27 DIAGNOSIS — C541 Malignant neoplasm of endometrium: Secondary | ICD-10-CM

## 2020-09-01 ENCOUNTER — Telehealth: Payer: Self-pay

## 2020-09-01 NOTE — Telephone Encounter (Signed)
Spoke with [name] and told her we will arrange for her to have a preop withMelissa Cross,NP on 09-10-20 and that we will call her with the time (either before pre-op or after). Will depend on what new patients are in that 9 and 10am spot. Reviewed out of work time for [facility] and IUD placement vis Robo hysterectomy with possible mini lap so she could discuss with her HR department.

## 2020-09-04 NOTE — Telephone Encounter (Signed)
Told Stacey Wise that she needs to call her PCP to be evaluated.  She took a home covid test and it was negative. Told her that she needed to talk with her PCP regarding evaluation. Pt verbalized understanding. Told Stacey Wise that FMLA paper work has not been received.  Gave her fax numbers: [phone] or alt number [phone] [id number]. Pt verbalized understanding.

## 2020-09-05 DIAGNOSIS — J0100 Acute maxillary sinusitis, unspecified: Secondary | ICD-10-CM | POA: Diagnosis not present

## 2020-09-05 DIAGNOSIS — J069 Acute upper respiratory infection, unspecified: Secondary | ICD-10-CM | POA: Diagnosis not present

## 2020-09-08 ENCOUNTER — Telehealth: Payer: Self-pay | Admitting: *Deleted

## 2020-09-08 NOTE — Patient Instructions (Signed)
Preparing for your Surgery  Plan for surgery on September 15, 2020 with Dr. Everitt Amber at [place] will be scheduled for a robotic assisted total laparoscopic hysterectomy (removal of the uterus and cervix), bilateral salpingo-oophorectomy (removal of both ovaries and fallopian tubes), mini laparotomy to deliver the specimens given the size of your uterus, possible dilation and curettage of the uterus with Mirena intrauterine device placement if the surgery cannot be tolerated from a physical standpoint.  Pre-operative Testing -(DONE) You will receive a phone call from presurgical testing at [facility] to arrange for a pre-operative appointment, lab appointment, and COVID test. The COVID test normally happens 3 days prior to the surgery and they ask that you self quarantine after the test up until surgery to decrease chance of exposure.  -Bring your insurance card, copy of an advanced directive if applicable, medication list  -At that visit, you will be asked to sign a consent for a possible blood transfusion in case a transfusion becomes necessary during surgery.  The need for a blood transfusion is rare but having consent is a necessary part of your care.     -You should not be taking blood thinners or aspirin at least ten days prior to surgery unless instructed by your surgeon.  -Do not take supplements such as fish oil (omega 3), red yeast rice, turmeric before your surgery.   Day Before Surgery at [place] will be asked to take in a light diet the day before surgery. You will be advised you can have clear liquids up until 3 hours before your surgery.    Eat a light diet the day before surgery.  Examples including soups, broths, toast, yogurt, mashed potatoes.  AVOID GAS PRODUCING FOODS. Things to avoid include carbonated beverages (fizzy beverages), raw fruits and raw vegetables, or beans.   If your bowels are filled with gas, your surgeon will have difficulty  visualizing your pelvic organs which increases your surgical risks.  Your role in recovery Your role is to become active as soon as directed by your doctor, while still giving yourself time to heal.  Rest when you feel tired. You will be asked to do the following in order to speed your recovery:  - Cough and breathe deeply. This helps to clear and expand your lungs and can prevent pneumonia after surgery.  - [place]. Do mild physical activity. Walking or moving your legs help your circulation and body functions return to normal. Do not try to get up or walk alone the first time after surgery.   -If you develop swelling on one leg or the other, pain in the back of your leg, redness/warmth in one of your legs, please call the office or go to the Emergency Room to have a doppler to rule out a blood clot. For shortness of breath, chest pain-seek care in the Emergency Room as soon as possible. - Actively manage your pain. Managing your pain lets you move in comfort. We will ask you to rate your pain on a scale of zero to 10. It is your responsibility to tell your doctor or nurse where and how much you hurt so your pain can be treated.  Special Considerations -If you are diabetic, you may be placed on insulin after surgery to have closer control over your blood sugars to promote healing and recovery.  This does not mean that you will be discharged on insulin.  If applicable, your oral antidiabetics  will be resumed when you are tolerating a solid diet.  -Your final pathology results from surgery should be available around one week after surgery and the results will be relayed to you when available.  -Dr. Lahoma Crocker is the surgeon that assists your GYN Oncologist with surgery.  If you end up staying the night, the next day after your surgery you will either see Dr. Denman George, Dr. Berline Lopes, or Dr. Lahoma Crocker.  -FMLA forms can be faxed to [phone] and please allow 5-7  business days for completion.  Pain Management After Surgery -You have been prescribed your pain medication and bowel regimen medications before surgery so that you can have these available when you are discharged from the hospital. The pain medication is for use ONLY AFTER surgery and a new prescription will not be given.   -Make sure that you have Tylenol and Ibuprofen at home to use on a regular basis after surgery for pain control. We recommend alternating the medications every hour to six hours since they work differently and are processed in the body differently for pain relief.  -Review the attached handout on narcotic use and their risks and side effects.   Bowel Regimen -You have been prescribed Sennakot-S to take nightly to prevent constipation especially if you are taking the narcotic pain medication intermittently.  It is important to prevent constipation and drink adequate amounts of liquids. You can stop taking this medication when you are not taking pain medication and you are back on your normal bowel routine.  Risks of Surgery Risks of surgery are low but include bleeding, infection, damage to surrounding structures, re-operation, blood clots, and very rarely death.   Blood Transfusion Information (For the consent to be signed before surgery)  We will be checking your blood type before surgery so in case of emergencies, we will know what type of blood you would need.                                            WHAT IS A BLOOD TRANSFUSION?  A transfusion is the replacement of blood or some of its parts. Blood is made up of multiple cells which provide different functions.  Red blood cells carry oxygen and are used for blood loss replacement.  White blood cells fight against infection.  Platelets control bleeding.  Plasma helps clot blood.  Other blood products are available for specialized needs, such as hemophilia or other clotting disorders. BEFORE THE TRANSFUSION   Who gives blood for transfusions?   You may be able to donate blood to be used at a later date on yourself (autologous donation).  Relatives can be asked to donate blood. This is generally not any safer than if you have received blood from a stranger. The same precautions are taken to ensure safety when a relative's blood is donated.  Healthy volunteers who are fully evaluated to make sure their blood is safe. This is blood bank blood. Transfusion therapy is the safest it has ever been in the practice of medicine. Before blood is taken from a donor, a complete history is taken to make sure that person has no history of diseases nor engages in risky social behavior (examples are intravenous drug use or sexual activity with multiple partners). The donor's travel history is screened to minimize risk of transmitting infections, such as malaria. The donated blood is tested  for signs of infectious diseases, such as HIV and hepatitis. The blood is then tested to be sure it is compatible with you in order to minimize the chance of a transfusion reaction. If you or a relative donates blood, this is often done in anticipation of surgery and is not appropriate for emergency situations. It takes many days to process the donated blood. RISKS AND COMPLICATIONS Although transfusion therapy is very safe and saves many lives, the main dangers of transfusion include:   Getting an infectious disease.  Developing a transfusion reaction. This is an allergic reaction to something in the blood you were given. Every precaution is taken to prevent this. The decision to have a blood transfusion has been considered carefully by your caregiver before blood is given. Blood is not given unless the benefits outweigh the risks.  AFTER SURGERY INSTRUCTIONS  Return to work: 4 weeks if applicable  Activity: 1. Be up and out of the bed during the day.  Take a nap if needed.  You may walk up steps but be careful and use the hand  rail.  Stair climbing will tire you more than you think, you may need to stop part way and rest.   2. No lifting or straining for 4 weeks over 10 pounds. No pushing, pulling, straining for 4 weeks.  3. No driving for 1 week(s).  Do not drive if you are taking narcotic pain medicine and make sure that your reaction time has returned.   4. You can shower as soon as the next day after surgery. Shower daily.  Use your regular soap and water (not directly on the incision) and pat your incision(s) dry afterwards; don't rub.  No tub baths or submerging your body in water until cleared by your surgeon. If you have the soap that was given to you by pre-surgical testing that was used before surgery, you do not need to use it afterwards because this can irritate your incisions.   5. No sexual activity and nothing in the vagina for 8 weeks.  6. You may experience a small amount of clear drainage from your incisions, which is normal.  If the drainage persists, increases, or changes color please call the office.  7. Do not use creams, lotions, or ointments such as neosporin on your incisions after surgery until advised by your surgeon because they can cause removal of the dermabond glue on your incisions.    8. You may experience vaginal spotting after surgery or around the 6-8 week mark from surgery when the stitches at the top of the vagina begin to dissolve.  The spotting is normal but if you experience heavy bleeding, call our office.  9. Take Tylenol or ibuprofen first for pain and only use narcotic pain medication for severe pain not relieved by the Tylenol or Ibuprofen.  Monitor your Tylenol intake to a max of 4,000 mg in a 24 hour period. You can alternate these medications after surgery.  Diet: 1. Low sodium Heart Healthy Diet is recommended but you are cleared to resume your normal (before surgery) diet after your procedure.  2. It is safe to use a laxative, such as Miralax or Colace, if you have  difficulty moving your bowels. You have been prescribed Sennakot at bedtime every evening to keep bowel movements regular and to prevent constipation.    Wound Care: 1. Keep clean and dry.  Shower daily.  Reasons to call the Doctor:  Fever - Oral temperature greater than 100.4 degrees  Fahrenheit  Foul-smelling vaginal discharge  Difficulty urinating  Nausea and vomiting  Increased pain at the site of the incision that is unrelieved with pain medicine.  Difficulty breathing with or without chest pain  New calf pain especially if only on one side  Sudden, continuing increased vaginal bleeding with or without clots.   Contacts: For questions or concerns you should contact:  Dr. Everitt Amber at [phone]  [name], NP at [phone]  After Hours: call [phone] and have the GYN Oncologist paged/contacted (after 5 pm or on the weekends).  Messages sent via mychart are for non-urgent matters and are not responded to after hours so for urgent needs, please call the after hours number.   Dilation and Curettage or Vacuum Curettage (POSSIBLE)  Dilation and curettage (D&C) and vacuum curettage are minor procedures. A D&C involves stretching the cervix (dilation) and scraping the inside lining of the uterus with surgical instruments (curettage). During a D&C, tissue is gently scraped from the lining of the uterus (endometrium), starting from the top portion of the uterus down to the lowest part of the uterus. During a vacuum curettage, the lining and tissue in the uterus are removed with the use of gentle suction. Curettage may be performed to either diagnose or treat a problem. For diagnosis A diagnostic curettage may be done if you have:  Irregular bleeding in the uterus.  Bleeding with the development of clots.  Spotting between menstrual periods.  Prolonged menstrual periods or other abnormal bleeding.  Bleeding after menopause.  No menstrual period  (amenorrhea).  A change in size and shape of the uterus.  Abnormal endometrial cells discovered during a Pap test. For treatment Curettage may be done:  To remove an IUD (intrauterine device).  To remove remaining placenta after giving birth.  During an abortion.  During a miscarriage.  To remove growths in the lining of the uterus.  To remove some rare types of non-cancerous lumps (fibroids). Tell a health care provider about:  Any allergies you have, including allergies to prescribed medicine or latex.  All medicines you are taking, including vitamins, herbs, eye drops, creams, and over-the-counter medicines.  Any blood-thinning medicine you may be taking.  Any problems you or family members have had with anesthetic medicines.  Any blood disorders you have.  Any surgeries you have had.  Your medical history and any medical conditions you have.  Whether you are pregnant or may be pregnant.  Recent vaginal infections you have had.  Recent menstrual periods, bleeding problems you have had, and what form of birth control (contraception) you use. What are the risks? Generally, this is a safe procedure. However, problems may occur, including:  Infection.  Heavy vaginal bleeding.  Allergic reactions to medicines.  Damage to the cervix or other structures or organs.  Development of scar tissue (adhesions) inside the uterus. This can cause abnormal periods and may make it harder to get pregnant.  A hole (perforation) in the wall of the uterus. This is rare. What happens before the procedure? Medicines  Ask your health care provider about: ? Changing or stopping your regular medicines. This is especially important if you are taking diabetes medicines or blood thinners. ? Taking medicines such as aspirin and ibuprofen. These medicines can thin your blood. Do not take these medicines unless your health care provider tells you to take them. ? Taking over-the-counter  medicines, vitamins, herbs, and supplements.  You may be given a medicine to soften the cervix in order to  help with dilation. Surgery safety Ask your health care provider what steps will be taken to help prevent infection. These may include:  Removing hair at the surgery site.  Washing skin with a germ-killing soap.  Taking antibiotic medicine. General instructions  Do not use any products that contain nicotine or tobacco for at least 4 weeks before the procedure. These products include cigarettes, e-cigarettes, and chewing tobacco. If you need help quitting, ask your health care provider.  For 24 hours before your procedure, do not: ? Douche. ? Use tampons. ? Use medicines, creams, or suppositories in the vagina. ? Have sex.  You may be given a pregnancy test on the day of the procedure.  You may have a blood or urine sample taken.  Plan to have someone take you home from the hospital or clinic.  If you will be going home right after the procedure, plan to have someone with you for 24 hours. What happens during the procedure?  An IV will be inserted into one of your veins.  You will be given one of the following: ? A medicine that numbs the area in and around the cervix (local anesthetic). ? A medicine to make you fall asleep (general anesthetic).  You will lie down on your back, with your feet in foot rests (stirrups).  The size and position of your uterus will be checked.  A lubricated instrument (speculum or Sims retractor) will be inserted into the back side of your vagina. The speculum will be used to hold apart the walls of your vagina so your health care provider can see your cervix.  A tool (tenaculum) will be attached to the lip of the cervix to stabilize it.  Your cervix will be softened and dilated. This may be done by: ? Taking medicine, either orally or vaginally. ? Having thin rods (laminaria) or gradual widening instruments (tapered dilators) inserted  into your cervix.  A small, sharp, curved instrument (curette) will be used to scrape a small amount of tissue or cells from the endometrium or cervical canal. In some cases, gentle suction is applied with the curette.  The curette will then be removed.  The cells will be taken to a lab for testing. The procedure may vary among health care providers and hospitals.   What happens after the procedure?  Your blood pressure, heart rate, breathing rate, and blood oxygen level will be monitored until you leave the hospital or clinic.  You may have mild cramping, backache, pain, and light bleeding or spotting. You may pass small blood clots from your vagina.  You may have to wear compression stockings. These stockings help to prevent blood clots and reduce swelling in your legs. Summary  Dilation and curettage (D&C) involves stretching (dilating) the cervix and scraping the inside lining of the uterus (curettage).  Follow your health care provider's instructions about when to stop eating and drinking, and whether to stop or change any medicines.  After the procedure, you may have mild cramping, backache, pain, and light bleeding or spotting. You may pass small blood clots from your vagina.  Plan to have someone take you home from the hospital or clinic. This information is not intended to replace advice given to you by your health care provider. Make sure you discuss any questions you have with your health care provider. Document Revised: 09/17/2019 Document Reviewed: 09/17/2019 Elsevier Patient Education  2021 [employer].   Intrauterine Device Insertion An intrauterine device (IUD) is a medical device that  gets inserted into the uterus to prevent pregnancy. It is a small, T-shaped device that has one or two nylon strings hanging down from it. The strings hang out of the lower part of the uterus (cervix) to allow for future IUD removal. There are two types of IUDs available:  Copper IUD.  This type of IUD has copper wire wrapped around it. Copper makes the uterus and fallopian tubes produce a fluid that kills sperm. A copper IUD may last up to 10 years.  Hormone IUD. This type of IUD is made of plastic and contains the hormone progestin (synthetic progesterone). The hormone thickens mucus in the cervix and prevents sperm from entering the uterus. It also thins the uterine lining to prevent implantation of a fertilized egg. The hormone can weaken or kill the sperm that get into the uterus. A hormone IUD may last 3-5 years. Tell a health care provider about:  Any allergies you have.  All medicines you are taking, including vitamins, herbs, eye drops, creams, and over-the-counter medicines.  Any problems you or family members have had with anesthetic medicines.  Any blood disorders you have.  Any surgeries you have had.  Any medical conditions you have, including any STIs (sexually transmitted infections) you may have.  Whether you are pregnant or may be pregnant. What are the risks? Generally, this is a safe procedure. However, problems may occur, including:  Infection.  Bleeding.  Allergic reactions to medicines.  Accidental puncture (perforation) of the uterus, or damage to other structures or organs.  Accidental placement of the IUD either in the muscle layer of the uterus (myometrium) or outside the uterus.  The IUD falling out of the uterus (expulsion). This is more common among women who have recently had a child.  Pregnancy that happens in the fallopian tube (ectopic pregnancy).  Infection of the uterus and fallopian tubes (pelvic inflammatory disease). What happens before the procedure?  Schedule the IUD insertion for when you will have your menstrual period or right after, to make sure you are not pregnant. Placement of the IUD is better tolerated shortly after a menstrual cycle.  Follow instructions from your health care provider about eating or  drinking restrictions.  Ask your health care provider about changing or stopping your regular medicines. This is especially important if you are taking diabetes medicines or blood thinners.  You may get a pain reliever to take before the procedure.  You may have tests for:  Pregnancy. A pregnancy test involves having a urine sample taken.  STIs. Placing an IUD in someone who has an STI can make the infection worse.  Cervical cancer. You may have a Pap test to check for this type of cancer. This means collecting cells from your cervix to be examined under a microscope.  You may have a physical exam to determine the size and position of your uterus. The procedure may vary among health care providers and hospitals. What happens during the procedure?  A tool (speculum) will be placed in your vagina and widened so that your health care provider can see your cervix.  Medicine may be applied to your cervix to help lower your risk of infection (antiseptic medicine).  You may be given an anesthetic medicine to numb each side of your cervix (intracervical block or paracervical block). This medicine is usually given by an injection into the cervix.  A tool (uterine sound) will be inserted into your uterus to determine the length of your uterus and the direction  that your uterus may be tilted.  A slim instrument or tube (IUD inserter) that holds the IUD will be inserted into your vagina, through your cervical canal, and into your uterus.  The IUD will be placed in the uterus, and the IUD inserter will be removed.  The strings that are attached to the IUD will be trimmed so that they lie just below the cervix. The procedure may vary among health care providers and hospitals. What happens after the procedure?  You may have bleeding after the procedure. This is normal. It varies from light bleeding (spotting) for a few days to menstrual-like bleeding.  You may have cramping and pain.  You may  feel dizzy or light-headed.  You may have lower back pain. Summary  An intrauterine device (IUD) is a small, T-shaped device that has one or two nylon strings hanging down from it.  Two types of IUDs are available. You may have a copper IUD or a hormone IUD.  Schedule the IUD insertion for when you will have your menstrual period or right after, to make sure you are not pregnant. Placement of the IUD is better tolerated shortly after a menstrual cycle.  You may have bleeding after the procedure. This is normal. It varies from light spotting for a few days to menstrual-like bleeding. This information is not intended to replace advice given to you by your health care provider. Make sure you discuss any questions you have with your health care provider. Document Released: 04/13/2011 Document Revised: 07/06/2016 Document Reviewed: 07/06/2016 Elsevier Interactive Patient Education  2017 [employer].  Levonorgestrel intrauterine device (IUD)  What is this medicine? LEVONORGESTREL IUD (LEE voe nor jes trel) is a contraceptive (birth control) device. The device is placed inside the uterus by a healthcare professional. It is used to prevent pregnancy and to treat other conditions such as endometrial cancer. This device can also be used to treat heavy bleeding that occurs during your period. This medicine may be used for other purposes; ask your health care provider or pharmacist if you have questions. COMMON BRAND NAME(S): Minette Headland What should I tell my health care provider before I take this medicine? They need to know if you have any of these conditions: -abnormal Pap smear -cancer of the breast, uterus, or cervix -diabetes -endometritis -genital or pelvic infection now or in the past -have more than one sexual partner or your partner has more than one partner -heart disease -history of an ectopic or tubal pregnancy -immune system problems -IUD in place -liver  disease or tumor -problems with blood clots or take blood-thinners -seizures -use intravenous drugs -uterus of unusual shape -vaginal bleeding that has not been explained -an unusual or allergic reaction to levonorgestrel, other hormones, silicone, or polyethylene, medicines, foods, dyes, or preservatives -pregnant or trying to get pregnant -breast-feeding How should I use this medicine? This device is placed inside the uterus by a health care professional. Talk to your pediatrician regarding the use of this medicine in children. Special care may be needed. Overdosage: If you think you have taken too much of this medicine contact a poison control center or emergency room at once. NOTE: This medicine is only for you. Do not share this medicine with others. What if I miss a dose? This does not apply. Depending on the brand of device you have inserted, the device will need to be replaced every 3 to 5 years if you wish to continue using this type of birth control.  What may interact with this medicine? Do not take this medicine with any of the following medications: -amprenavir -bosentan -fosamprenavir This medicine may also interact with the following medications: -aprepitant -armodafinil -barbiturate medicines for inducing sleep or treating seizures -bexarotene -boceprevir -griseofulvin -medicines to treat seizures like carbamazepine, ethotoin, felbamate, oxcarbazepine, phenytoin, topiramate -modafinil -pioglitazone -rifabutin -rifampin -rifapentine -some medicines to treat HIV infection like atazanavir, efavirenz, indinavir, lopinavir, nelfinavir, tipranavir, ritonavir -St. John's wort -warfarin This list may not describe all possible interactions. Give your health care provider a list of all the medicines, herbs, non-prescription drugs, or dietary supplements you use. Also tell them if you smoke, drink alcohol, or use illegal drugs. Some items may interact with your  medicine. What should I watch for while using this medicine? Visit your doctor or health care professional for regular check ups. See your doctor if you or your partner has sexual contact with others, becomes HIV positive, or gets a sexual transmitted disease. This product does not protect you against HIV infection (AIDS) or other sexually transmitted diseases. You can check the placement of the IUD yourself by reaching up to the top of your vagina with clean fingers to feel the threads. Do not pull on the threads. It is a good habit to check placement after each menstrual period. Call your doctor right away if you feel more of the IUD than just the threads or if you cannot feel the threads at all. The IUD may come out by itself. You may become pregnant if the device comes out. If you notice that the IUD has come out use a backup birth control method like condoms and call your health care provider. Using tampons will not change the position of the IUD and are okay to use during your period. This IUD can be safely scanned with magnetic resonance imaging (MRI) only under specific conditions. Before you have an MRI, tell your healthcare provider that you have an IUD in place, and which type of IUD you have in place. What side effects may I notice from receiving this medicine? Side effects that you should report to your doctor or health care professional as soon as possible: -allergic reactions like skin rash, itching or hives, swelling of the face, lips, or tongue -fever, flu-like symptoms -genital sores -high blood pressure -no menstrual period for 6 weeks during use -pain, swelling, warmth in the leg -pelvic pain or tenderness -severe or sudden headache -signs of pregnancy -stomach cramping -sudden shortness of breath -trouble with balance, talking, or walking -unusual vaginal bleeding, discharge -yellowing of the eyes or skin Side effects that usually do not require medical attention (report  to your doctor or health care professional if they continue or are bothersome): -acne -breast pain -change in sex drive or performance -changes in weight -cramping, dizziness, or faintness while the device is being inserted -headache -irregular menstrual bleeding within first 3 to 6 months of use -nausea This list may not describe all possible side effects. Call your doctor for medical advice about side effects. You may report side effects to FDA at 1-800-FDA-1088. Where should I keep my medicine? This does not apply. NOTE: This sheet is a summary. It may not cover all possible information. If you have questions about this medicine, talk to your doctor, pharmacist, or health care provider.  2018 Elsevier/Gold Standard (2016-05-27 14:14:56)

## 2020-09-08 NOTE — Telephone Encounter (Signed)
Called and scheduled the patient to see Melissa APP for a pre op appt on 1/13 at 10 am

## 2020-09-08 NOTE — Progress Notes (Signed)
Patient here for a pre-operative appointment prior to her scheduled surgery on September 15, 2020. She is scheduled for a robotic assisted total laparoscopic hysterectomy (removal of the uterus and cervix), bilateral salpingo-oophorectomy (removal of both ovaries and fallopian tubes), mini laparotomy to deliver the specimens given the size of your uterus, possible dilation and curettage of the uterus with Mirena intrauterine device placement if the surgery cannot be tolerated from a physical standpoint.  She had her pre-admission testing appointment this am at [facility].  The surgery was discussed in detail.  See after visit summary for additional details. Visual aids used to discuss items related to surgery including sequential compression stockings, foley catheter, IV pump, multi-modal pain regimen including tylenol, photo of the surgical robot, female reproductive system to discuss surgery in detail.      Discussed post-op pain management in detail including the aspects of the enhanced recovery pathway.  Advised her that a new prescription would be sent in for tramadol and it is only to be used for after her upcoming surgery.  We discussed the use of tylenol post-op and to monitor for a maximum of 4,000 mg in a 24 hour period.  Also prescribed sennakot to be used after surgery and to hold if having loose stools.  Discussed bowel regimen in detail. She has stopped her aspirin.   Discussed the use of lovenox pre-op, SCDs, and measures to take at home to prevent DVT including frequent mobility.  Reportable signs and symptoms of DVT discussed. Post-operative instructions discussed and expectations for after surgery. Incisional care discussed as well including reportable signs and symptoms including erythema, drainage, wound separation.     10 minutes spent with the patient.  All questions answered. Verbalizing understanding of material discussed. No needs or concerns voiced at the end of the visit.   Advised  patient to call for any needs.  Advised that her post-operative medications had been prescribed and could be picked up at any time.

## 2020-09-09 NOTE — Progress Notes (Signed)
DUE TO COVID-19 ONLY ONE VISITOR IS ALLOWED TO COME WITH YOU AND STAY IN THE WAITING ROOM ONLY DURING PRE OP AND PROCEDURE DAY OF SURGERY. THE 1 VISITOR  MAY VISIT WITH YOU AFTER SURGERY IN YOUR PRIVATE ROOM DURING VISITING HOURS ONLY!  YOU NEED TO HAVE A COVID 19 TEST ON__1/14/2022 _____ @_______ , THIS TEST MUST BE DONE BEFORE SURGERY,  COVID TESTING SITE [address] [place] [address], IT IS ON THE RIGHT GOING OUT [address] APPROXIMATELY  2 MINUTES PAST [employer] ON THE RIGHT. ONCE YOUR COVID TEST IS COMPLETED,  PLEASE BEGIN THE QUARANTINE INSTRUCTIONS AS OUTLINED IN YOUR HANDOUT.                Brent Noto  09/09/2020   Your procedure is scheduled on: 1/18/.2022   Report to [facility] Main  Entrance   Report to admitting at       0545 AM     Call this number if you have problems the morning of surgery [phone]    Remember: Do not eat food , candy gum or mints :After Midnight. You may have clear liquids from midnight until 0445am     CLEAR LIQUID DIET   Foods Allowed                                                                       Coffee and tea, regular and decaf                              Plain Jell-O any favor except red or purple                                            Fruit ices (not with fruit pulp)                                      Iced Popsicles                                     Carbonated beverages, regular and diet                                    Cranberry, grape and apple juices Sports drinks like Gatorade Lightly seasoned clear broth or consume(fat free) Sugar, honey syrup   _____________________________________________________________________    BRUSH YOUR TEETH MORNING OF SURGERY AND RINSE YOUR MOUTH OUT, NO CHEWING GUM CANDY OR MINTS.     Take these medicines the morning of surgery with A SIP OF WATER: Diltiazem, hydralazine   DO NOT TAKE ANY DIABETIC MEDICATIONS DAY OF YOUR SURGERY                                You may not have any metal  on your body including hair pins and              piercings  Do not wear jewelry, make-up, lotions, powders or perfumes, deodorant             Do not wear nail polish on your fingernails.  Do not shave  48 hours prior to surgery.              Men may shave face and neck.   Do not bring valuables to the hospital. [place].  Contacts, dentures or bridgework may not be worn into surgery.  Leave suitcase in the car. After surgery it may be brought to your room.     Patients discharged the day of surgery will not be allowed to drive home. IF YOU ARE HAVING SURGERY AND GOING HOME THE SAME DAY, YOU MUST HAVE AN ADULT TO DRIVE YOU HOME AND BE WITH YOU FOR 24 HOURS. YOU MAY GO HOME BY TAXI OR UBER OR ORTHERWISE, BUT AN ADULT MUST ACCOMPANY YOU HOME AND STAY WITH YOU FOR 24 HOURS.  Name and phone number of your driver:  Special Instructions: N/A              Please read over the following fact sheets you were given: _____________________________________________________________________  [facility] - Preparing for Surgery Before surgery, you can play an important role.  Because skin is not sterile, your skin needs to be as free of germs as possible.  You can reduce the number of germs on your skin by washing with CHG (chlorahexidine gluconate) soap before surgery.  CHG is an antiseptic cleaner which kills germs and bonds with the skin to continue killing germs even after washing. Please DO NOT use if you have an allergy to CHG or antibacterial soaps.  If your skin becomes reddened/irritated stop using the CHG and inform your nurse when you arrive at Short Stay. Do not shave (including legs and underarms) for at least 48 hours prior to the first CHG shower.  You may shave your face/neck. Please follow these instructions carefully:  1.  Shower with CHG Soap the night before surgery and the  morning of  Surgery.  2.  If you choose to wash your hair, wash your hair first as usual with your  normal  shampoo.  3.  After you shampoo, rinse your hair and body thoroughly to remove the  shampoo.                           4.  Use CHG as you would any other liquid soap.  You can apply chg directly  to the skin and wash                       Gently with a scrungie or clean washcloth.  5.  Apply the CHG Soap to your body ONLY FROM THE NECK DOWN.   Do not use on face/ open                           Wound or open sores. Avoid contact with eyes, ears mouth and genitals (private parts).                       Wash face,  Genitals (private parts) with your normal soap.             6.  Wash thoroughly, paying special attention to the area where your surgery  will be performed.  7.  Thoroughly rinse your body with warm water from the neck down.  8.  DO NOT shower/wash with your normal soap after using and rinsing off  the CHG Soap.                9.  Pat yourself dry with a clean towel.            10.  Wear clean pajamas.            11.  Place clean sheets on your bed the night of your first shower and do not  sleep with pets. Day of Surgery : Do not apply any lotions/deodorants the morning of surgery.  Please wear clean clothes to the hospital/surgery center.  FAILURE TO FOLLOW THESE INSTRUCTIONS MAY RESULT IN THE CANCELLATION OF YOUR SURGERY PATIENT SIGNATURE_________________________________  NURSE SIGNATURE__________________________________  ________________________________________________________________________

## 2020-09-10 ENCOUNTER — Encounter (HOSPITAL_COMMUNITY): Payer: Self-pay

## 2020-09-10 ENCOUNTER — Other Ambulatory Visit: Payer: Self-pay

## 2020-09-10 ENCOUNTER — Telehealth: Payer: Self-pay

## 2020-09-10 ENCOUNTER — Inpatient Hospital Stay: Payer: BC Managed Care – PPO | Attending: Gynecologic Oncology | Admitting: Gynecologic Oncology

## 2020-09-10 ENCOUNTER — Encounter (HOSPITAL_COMMUNITY)
Admission: RE | Admit: 2020-09-10 | Discharge: 2020-09-10 | Disposition: A | Discharge status: Home / Self Care | Payer: BC Managed Care – PPO | Source: Ambulatory Visit | Attending: Gynecologic Oncology | Admitting: Gynecologic Oncology

## 2020-09-10 VITALS — BP 154/75 | HR 101 | Temp 98.7°F | Resp 16 | Ht 64.0 in | Wt 317.0 lb

## 2020-09-10 DIAGNOSIS — Z01818 Encounter for other preprocedural examination: Secondary | ICD-10-CM | POA: Insufficient documentation

## 2020-09-10 DIAGNOSIS — C541 Malignant neoplasm of endometrium: Secondary | ICD-10-CM | POA: Insufficient documentation

## 2020-09-10 HISTORY — DX: Malignant (primary) neoplasm, unspecified: C80.1

## 2020-09-10 LAB — URINALYSIS, ROUTINE W REFLEX MICROSCOPIC
Bilirubin Urine: NEGATIVE
Glucose, UA: NEGATIVE mg/dL
Hgb urine dipstick: NEGATIVE
Ketones, ur: NEGATIVE mg/dL
Leukocytes,Ua: NEGATIVE
Nitrite: NEGATIVE
Protein, ur: NEGATIVE mg/dL
Specific Gravity, Urine: 1.005 (ref 1.005–1.030)
pH: 5 (ref 5.0–8.0)

## 2020-09-10 LAB — COMPREHENSIVE METABOLIC PANEL
ALT: 18 U/L (ref 0–44)
AST: 19 U/L (ref 15–41)
Albumin: 3.7 g/dL (ref 3.5–5.0)
Alkaline Phosphatase: 86 U/L (ref 38–126)
Anion gap: 12 (ref 5–15)
BUN: 19 mg/dL (ref 8–23)
CO2: 28 mmol/L (ref 22–32)
Calcium: 10.4 mg/dL — ABNORMAL HIGH (ref 8.9–10.3)
Chloride: 105 mmol/L (ref 98–111)
Creatinine, Ser: 1 mg/dL (ref 0.44–1.00)
GFR, Estimated: >60 mL/min (ref >60)
Glucose, Bld: 155 mg/dL — ABNORMAL HIGH (ref 70–99)
Potassium: 3.4 mmol/L — ABNORMAL LOW (ref 3.5–5.1)
Sodium: 145 mmol/L (ref 135–145)
Total Bilirubin: 0.5 mg/dL (ref 0.3–1.2)
Total Protein: 6.8 g/dL (ref 6.5–8.1)

## 2020-09-10 LAB — CBC
HCT: 39.4 % (ref 36.0–46.0)
Hemoglobin: 12.4 g/dL (ref 12.0–15.0)
MCH: 27.3 pg (ref 26.0–34.0)
MCHC: 31.5 g/dL (ref 30.0–36.0)
MCV: 86.6 fL (ref 80.0–100.0)
Platelets: 256 10*3/uL (ref 150–400)
RBC: 4.55 MIL/uL (ref 3.87–5.11)
RDW: 14.3 % (ref 11.5–15.5)
WBC: 8.4 10*3/uL (ref 4.0–10.5)
nRBC: 0 % (ref 0.0–0.2)

## 2020-09-10 MED ORDER — IBUPROFEN 600 MG PO TABS: 600.0000 mg | ORAL_TABLET | Freq: Four times a day (QID) | ORAL | 0 refills | Status: DC | PRN

## 2020-09-10 MED ORDER — TRAMADOL HCL 50 MG PO TABS: 50.0000 mg | ORAL_TABLET | Freq: Four times a day (QID) | ORAL | 0 refills | Status: DC | PRN

## 2020-09-10 MED ORDER — SENNOSIDES-DOCUSATE SODIUM 8.6-50 MG PO TABS: 2.0000 | ORAL_TABLET | Freq: Every day | ORAL | 0 refills | Status: DC

## 2020-09-10 NOTE — Telephone Encounter (Signed)
Told Ms Stacey Wise that her blood type is B positive. Her potassium level is slightly low at 3.4. She needs to increase her diet with potassium rich foods such as bananas cantaloupe, and skins of baked potato per Stacey John, NP.  Pt is diabetic so recommended to watch her food exchanges.

## 2020-09-10 NOTE — Progress Notes (Addendum)
Anesthesia Review:  PCP: DR Aletta Edouard seen on 09/05/2020 for sinusitis - put on amoxicillin and phenergan with codeine cough syrup. Requested LOV note .   Cardiologist : Chest x-ray : EKG :`09/10/2020  Echo : Stress test: Cardiac Cath :  Activity level: can do a flight of stairs slowly per pt  Sleep Study/ CPAP : Fasting Blood Sugar :      / Checks Blood Sugar -- times a day:   Blood Thinner/ Instructions /Last Dose: ASA / Instructions/ Last Dose :  08/26/2020 - last hgba1c-7.3  81 mg Aspirin  Checks glucose twice daily per pt  Pt was supposed to have anesthesia consult at time of preop for hx of difficulty airway.

## 2020-09-12 ENCOUNTER — Other Ambulatory Visit (HOSPITAL_COMMUNITY)
Admission: RE | Admit: 2020-09-12 | Discharge: 2020-09-12 | Disposition: A | Discharge status: Home / Self Care | Payer: BC Managed Care – PPO | Source: Ambulatory Visit | Attending: Gynecologic Oncology | Admitting: Gynecologic Oncology

## 2020-09-12 DIAGNOSIS — Z01812 Encounter for preprocedural laboratory examination: Secondary | ICD-10-CM | POA: Diagnosis not present

## 2020-09-12 DIAGNOSIS — U071 COVID-19: Secondary | ICD-10-CM | POA: Diagnosis not present

## 2020-09-12 LAB — SARS CORONAVIRUS 2 (TAT 6-24 HRS): SARS Coronavirus 2: POSITIVE — AB

## 2020-09-13 NOTE — Progress Notes (Signed)
Unable to reach anyone through the on-call service for Dr. Everitt Amber, so an in box message left with + covid results for this pt. The pt is to have surgery on Tues 09/15/20 at [facility] at 7:45am. The pt has not been notified as the surgeon may need to answer pertinent questions.  These are the current guidelines:  Positive Results for:  Asymptomatic: Procedure postponed and quarantined for 10 days. Symptomatic: Procedure postponed and quarantined for 14 days. Hospitalized with Covid: postponed and quarantined  for 21 days. Immunocompromised: Procedure postponed and quarantine for 20 days.  The pt will not be retested for 90 days from the + result.

## 2020-09-14 ENCOUNTER — Telehealth: Payer: Self-pay | Admitting: Gynecologic Oncology

## 2020-09-14 ENCOUNTER — Telehealth: Payer: Self-pay | Admitting: Oncology

## 2020-09-14 MED ORDER — DEXTROSE 5 % IV SOLN
3.0000 g | INTRAVENOUS | Status: AC
  Filled 2020-09-14: qty 3000

## 2020-09-14 NOTE — Telephone Encounter (Signed)
Informed patient of COVID positive test result. She reported to me that she has had symptoms of nasal congestion, cough, fever and shortness of breath since January 6th. She reported that she went to an urgent care associated with [facility] on 09/06/19 and was presumed to have "sinusitis" and empirically treated with Augmentin with no improvement in symptoms. They did not offer COVID testing.  She then underwent COVID testing on 09/12/20 as part of preop screening. This was positive.  I called the patient and notified her of her results. I explained that her surgery needs to be postponed because she is symptomatic and this is not a category 1 procedure.   I informed the patient that we will reschedule for when she is asymptomatic and meets the hospital's policy for when it is safe to proceed after a positive test result.  I counseled her regarding the symptoms of severe COVID pneumonia. I counseled her to reach out to her PCP's office. I told her to inform them that she tested positive for COVID and was having cough and breathing symptoms. She is at very high risk for severe COVID assosicated disease because of her morbid obesity. She was informed to enquire with her PCP office if she is a candidate for treatments for her COVID related illness to decrease duration/severity. I counseled her regarding the symptoms she should watch for regarding severe COVID illness and notified her to present to the ER if these develop.   Thereasa Solo, MD

## 2020-09-14 NOTE — Telephone Encounter (Signed)
Called Jennise and discussed her surgery being canceled for tomorrow due to testing positive for Covid.  She said she had just talked with Dr. Denman George. Asked if 09/29/20 would work for her surgery to be rescheduled and she said it would.  Discussed that we will call her if this date changes.  She verbalized understanding and agreement.

## 2020-09-15 ENCOUNTER — Telehealth: Payer: Self-pay | Admitting: *Deleted

## 2020-09-15 DIAGNOSIS — C541 Malignant neoplasm of endometrium: Secondary | ICD-10-CM

## 2020-09-15 LAB — TYPE AND SCREEN
ABO/RH(D): B POS
Antibody Screen: NEGATIVE

## 2020-09-15 NOTE — Telephone Encounter (Signed)
Patient called to let Dr Denman George know she has tried several times yesterday and this morning to reach her PCP. Patient stated that no one is answering at the office. Dr Serita Grit message sent via fax to the PCP office

## 2020-09-15 NOTE — Telephone Encounter (Signed)
Per Dr Denman George called the patient and explained "Just keep trying to reach your PCP."

## 2020-09-16 NOTE — Telephone Encounter (Signed)
Patient called back last night and left a message that she reached her PCP. Stated that "Dr Jacelyn Grip recommended waiting 10 days before having surgery. She gave no other recommendations." Message given to Dr Denman George

## 2020-09-18 ENCOUNTER — Telehealth: Payer: Self-pay | Admitting: *Deleted

## 2020-09-18 NOTE — Telephone Encounter (Signed)
Fax disability papers

## 2020-09-21 ENCOUNTER — Ambulatory Visit: Payer: BC Managed Care – PPO | Admitting: Gynecologic Oncology

## 2020-09-21 DIAGNOSIS — Z794 Long term (current) use of insulin: Secondary | ICD-10-CM | POA: Diagnosis not present

## 2020-09-21 DIAGNOSIS — M8580 Other specified disorders of bone density and structure, unspecified site: Secondary | ICD-10-CM | POA: Diagnosis not present

## 2020-09-21 DIAGNOSIS — E1165 Type 2 diabetes mellitus with hyperglycemia: Secondary | ICD-10-CM | POA: Diagnosis not present

## 2020-09-23 ENCOUNTER — Other Ambulatory Visit: Payer: Self-pay

## 2020-09-23 ENCOUNTER — Encounter (HOSPITAL_COMMUNITY): Payer: Self-pay

## 2020-09-23 ENCOUNTER — Telehealth: Payer: Self-pay

## 2020-09-23 ENCOUNTER — Encounter (HOSPITAL_COMMUNITY)
Admission: RE | Admit: 2020-09-23 | Discharge: 2020-09-23 | Disposition: A | Discharge status: Home / Self Care | Payer: BC Managed Care – PPO | Source: Ambulatory Visit

## 2020-09-23 NOTE — Telephone Encounter (Signed)
Florentina Jenny stated that Ms Nobles will see Konrad Felix PA-C Friday 09-25-20 when pt there for labs at 1000 for evaluation. Information given to [name], NP.

## 2020-09-23 NOTE — Progress Notes (Addendum)
Anesthesia Review:  PCP: DR Jacelyn Grip  Was treated for sinusitis on 09/10/2020 when nurse last saw for preop appt .  Tested positive for covid on 09/12/2020.  Spoke with by phone on 09/23/2020 still has cough per pt .  PCP aware.  Called and [facility] for OB/GYN ONC lab work to be done on 09/25/2020 at 1000am   Has anesthesia consult ordered for difficulty airway by MD  Cardiologist : Chest x-ray : EKG :09/10/2020  Echo : Stress test: Cardiac Cath :  Activity level: can do a flgiht of stairs slowly per pt  Sleep Study/ CPAP : Fasting Blood Sugar :      / Checks Blood Sugar -- times a day:   Blood Thinner/ Instructions /Last Dose: ASA / Instructions/ Last Dose :  DM- type 2  Checks glucose twice daily  hgba1c-7.3 on 08/25/2020  Per pt DR Denman George mentioned difficult intubation on preop for upcoming surgery and DR Denman George stated it was at time of D and C which was 12/2016.  At [facility].  Roanna Banning made aware.

## 2020-09-23 NOTE — Progress Notes (Signed)
Completed updated phone call for surgery on 09/29/20.  REviewed and updated meds.  Pt tested postive for covid on 09/12/20.  Pt still has cough per pt .  PT to come in on 09/25/20 for labs for surgery on 09/29/20 and pick up preop instruction sheet.  Pt stll has hibiclens at home unopened.

## 2020-09-23 NOTE — Progress Notes (Signed)
DUE TO COVID-19 ONLY ONE VISITOR IS ALLOWED TO COME WITH YOU AND STAY IN THE WAITING ROOM ONLY DURING PRE OP AND PROCEDURE DAY OF SURGERY. THE 1 VISITOR  MAY VISIT WITH YOU AFTER SURGERY IN YOUR PRIVATE ROOM DURING VISITING HOURS ONLY!  YOU NEED TO HAVE A COVID 19 TEST ON_______ @_______ , THIS TEST MUST BE DONE BEFORE SURGERY,  COVID TESTING SITE [address] [place] [address], IT IS ON THE RIGHT GOING OUT [address] APPROXIMATELY  2 MINUTES PAST [employer] ON THE RIGHT. ONCE YOUR COVID TEST IS COMPLETED,  PLEASE BEGIN THE QUARANTINE INSTRUCTIONS AS OUTLINED IN YOUR HANDOUT.                Stacey Wise  09/23/2020   Your procedure is scheduled on: 09/30/2019    Report to [facility] Main  Entrance   Report to admitting at    [place] AM     Call this number if you have problems the morning of surgery [phone]    Remember: Do not eat food , candy gum or mints :After Midnight. You may have clear liquids from midnight until 0430am     CLEAR LIQUID DIET   Foods Allowed                                                                       Coffee and tea, regular and decaf                              Plain Jell-O any favor except red or purple                                            Fruit ices (not with fruit pulp)                                      Iced Popsicles                                     Carbonated beverages, regular and diet                                    Cranberry, grape and apple juices Sports drinks like Gatorade Lightly seasoned clear broth or consume(fat free) Sugar, honey syrup   _____________________________________________________________________    BRUSH YOUR TEETH MORNING OF SURGERY AND RINSE YOUR MOUTH OUT, NO CHEWING GUM CANDY OR MINTS.     Take these medicines the morning of surgery with A SIP OF WATER: diltiazem ( if takes in am ) , Hydralazine ,  Take 14 units of Humalog 75/25 nite before surgery.  DO NOT  TAKE ANY DIABETIC MEDICATIONS DAY OF YOUR SURGERY  You may not have any metal on your body including hair pins and              piercings  Do not wear jewelry, make-up, lotions, powders or perfumes, deodorant             Do not wear nail polish on your fingernails.  Do not shave  48 hours prior to surgery.              Men may shave face and neck.   Do not bring valuables to the hospital. [place].  Contacts, dentures or bridgework may not be worn into surgery.  Leave suitcase in the car. After surgery it may be brought to your room.     Patients discharged the day of surgery will not be allowed to drive home. IF YOU ARE HAVING SURGERY AND GOING HOME THE SAME DAY, YOU MUST HAVE AN ADULT TO DRIVE YOU HOME AND BE WITH YOU FOR 24 HOURS. YOU MAY GO HOME BY TAXI OR UBER OR ORTHERWISE, BUT AN ADULT MUST ACCOMPANY YOU HOME AND STAY WITH YOU FOR 24 HOURS.  Name and phone number of your driver:  Special Instructions: N/A              Please read over the following fact sheets you were given: _____________________________________________________________________  [facility] - Preparing for Surgery Before surgery, you can play an important role.  Because skin is not sterile, your skin needs to be as free of germs as possible.  You can reduce the number of germs on your skin by washing with CHG (chlorahexidine gluconate) soap before surgery.  CHG is an antiseptic cleaner which kills germs and bonds with the skin to continue killing germs even after washing. Please DO NOT use if you have an allergy to CHG or antibacterial soaps.  If your skin becomes reddened/irritated stop using the CHG and inform your nurse when you arrive at Short Stay. Do not shave (including legs and underarms) for at least 48 hours prior to the first CHG shower.  You may shave your face/neck. Please follow these instructions carefully:  1.  Shower with  CHG Soap the night before surgery and the  morning of Surgery.  2.  If you choose to wash your hair, wash your hair first as usual with your  normal  shampoo.  3.  After you shampoo, rinse your hair and body thoroughly to remove the  shampoo.                           4.  Use CHG as you would any other liquid soap.  You can apply chg directly  to the skin and wash                       Gently with a scrungie or clean washcloth.  5.  Apply the CHG Soap to your body ONLY FROM THE NECK DOWN.   Do not use on face/ open                           Wound or open sores. Avoid contact with eyes, ears mouth and genitals (private parts).  Wash face,  Genitals (private parts) with your normal soap.             6.  Wash thoroughly, paying special attention to the area where your surgery  will be performed.  7.  Thoroughly rinse your body with warm water from the neck down.  8.  DO NOT shower/wash with your normal soap after using and rinsing off  the CHG Soap.                9.  Pat yourself dry with a clean towel.            10.  Wear clean pajamas.            11.  Place clean sheets on your bed the night of your first shower and do not  sleep with pets. Day of Surgery : Do not apply any lotions/deodorants the morning of surgery.  Please wear clean clothes to the hospital/surgery center.  FAILURE TO FOLLOW THESE INSTRUCTIONS MAY RESULT IN THE CANCELLATION OF YOUR SURGERY PATIENT SIGNATURE_________________________________  NURSE SIGNATURE__________________________________  ________________________________________________________________________

## 2020-09-25 ENCOUNTER — Other Ambulatory Visit: Payer: Self-pay

## 2020-09-25 ENCOUNTER — Encounter (HOSPITAL_COMMUNITY)
Admission: RE | Admit: 2020-09-25 | Discharge: 2020-09-25 | Disposition: A | Discharge status: Home / Self Care | Payer: BC Managed Care – PPO | Source: Ambulatory Visit | Attending: Gynecologic Oncology | Admitting: Gynecologic Oncology

## 2020-09-25 DIAGNOSIS — Z01812 Encounter for preprocedural laboratory examination: Secondary | ICD-10-CM | POA: Insufficient documentation

## 2020-09-25 DIAGNOSIS — R059 Cough, unspecified: Secondary | ICD-10-CM | POA: Diagnosis not present

## 2020-09-25 DIAGNOSIS — U071 COVID-19: Secondary | ICD-10-CM | POA: Diagnosis not present

## 2020-09-25 LAB — CBC
HCT: 39.4 % (ref 36.0–46.0)
Hemoglobin: 12.3 g/dL (ref 12.0–15.0)
MCH: 27.4 pg (ref 26.0–34.0)
MCHC: 31.2 g/dL (ref 30.0–36.0)
MCV: 87.8 fL (ref 80.0–100.0)
Platelets: 302 10*3/uL (ref 150–400)
RBC: 4.49 MIL/uL (ref 3.87–5.11)
RDW: 14.5 % (ref 11.5–15.5)
WBC: 14.2 10*3/uL — ABNORMAL HIGH (ref 4.0–10.5)
nRBC: 0 % (ref 0.0–0.2)

## 2020-09-25 LAB — BASIC METABOLIC PANEL
Anion gap: 10 (ref 5–15)
BUN: 17 mg/dL (ref 8–23)
CO2: 27 mmol/L (ref 22–32)
Calcium: 10.5 mg/dL — ABNORMAL HIGH (ref 8.9–10.3)
Chloride: 103 mmol/L (ref 98–111)
Creatinine, Ser: 1.03 mg/dL — ABNORMAL HIGH (ref 0.44–1.00)
GFR, Estimated: >60 mL/min (ref >60)
Glucose, Bld: 180 mg/dL — ABNORMAL HIGH (ref 70–99)
Potassium: 4.1 mmol/L (ref 3.5–5.1)
Sodium: 140 mmol/L (ref 135–145)

## 2020-09-25 NOTE — Progress Notes (Signed)
CBC results 09/25/20.faxed to Dr. Denman George and Jerilynn Mages. [place] NP

## 2020-09-28 ENCOUNTER — Telehealth: Payer: Self-pay

## 2020-09-28 NOTE — Telephone Encounter (Signed)
Note    Ms Mcelhinney states that she will have her surgery on 10-13-20. Relayed this information to [name], NP.

## 2020-09-28 NOTE — Telephone Encounter (Signed)
Ms Stacey Wise states that she continues with an intermittent cough.  Afebrile. No other symptoms.  Dr. Jacelyn Wise sent in some cough medication but her insurance will not cover it. Told her to call his office to see if an alternative medication could be sent in. Told her that with her difficult airway access and continued cough Dr. Denman Wise wants to postpone the surgery to 2-8 or 10-13-20. Ms Stacey Wise has to discuss with HR as she has not worked and bills coming in. Told her that she needed to discuss th FMLA for covid with Dr. Jodi Wise office as we cover FMLA for the surgery. Ms Stacey Wise will call back with a date chosen for surgery after she discusses with her HR department.

## 2020-09-29 DIAGNOSIS — C541 Malignant neoplasm of endometrium: Secondary | ICD-10-CM

## 2020-09-29 LAB — TYPE AND SCREEN
ABO/RH(D): B POS
Antibody Screen: NEGATIVE

## 2020-10-05 DIAGNOSIS — H2512 Age-related nuclear cataract, left eye: Secondary | ICD-10-CM | POA: Diagnosis not present

## 2020-10-06 ENCOUNTER — Other Ambulatory Visit: Payer: Self-pay | Admitting: Gynecologic Oncology

## 2020-10-06 ENCOUNTER — Ambulatory Visit: Payer: BC Managed Care – PPO | Admitting: Gynecologic Oncology

## 2020-10-06 DIAGNOSIS — C541 Malignant neoplasm of endometrium: Secondary | ICD-10-CM

## 2020-10-06 NOTE — Progress Notes (Signed)
Stacey Wise aware to arrive at 8:30 AM 10/13/20, instructions reviewed, verbalized understanding.

## 2020-10-08 ENCOUNTER — Encounter: Payer: BC Managed Care – PPO | Admitting: Gynecologic Oncology

## 2020-10-12 ENCOUNTER — Telehealth: Payer: Self-pay

## 2020-10-12 MED ORDER — DEXTROSE 5 % IV SOLN
3.0000 g | INTRAVENOUS | Status: AC
  Administered 2020-10-13: 3 g via INTRAVENOUS
  Filled 2020-10-12: qty 3

## 2020-10-12 NOTE — Telephone Encounter (Signed)
Ms Nored states she understands her written pre-op instructions. No questions or concerns noted at this time. Ms Dennen has been off her ASA since January prior to her first surgery date.  She never resumed it.

## 2020-10-13 ENCOUNTER — Ambulatory Visit (HOSPITAL_COMMUNITY): Payer: BC Managed Care – PPO | Admitting: Physician Assistant

## 2020-10-13 ENCOUNTER — Other Ambulatory Visit: Payer: Self-pay

## 2020-10-13 ENCOUNTER — Encounter (HOSPITAL_COMMUNITY): Payer: Self-pay | Admitting: Gynecologic Oncology

## 2020-10-13 ENCOUNTER — Ambulatory Visit (HOSPITAL_COMMUNITY)
Admission: RE | Admit: 2020-10-13 | Discharge: 2020-10-13 | Disposition: A | Discharge status: Home / Self Care | Payer: BC Managed Care – PPO | Attending: Gynecologic Oncology | Admitting: Gynecologic Oncology

## 2020-10-13 ENCOUNTER — Encounter (HOSPITAL_COMMUNITY)
Admission: RE | Disposition: A | Discharge status: Home / Self Care | Payer: Self-pay | Source: Home / Self Care | Attending: Gynecologic Oncology

## 2020-10-13 DIAGNOSIS — E119 Type 2 diabetes mellitus without complications: Secondary | ICD-10-CM | POA: Insufficient documentation

## 2020-10-13 DIAGNOSIS — K429 Umbilical hernia without obstruction or gangrene: Secondary | ICD-10-CM | POA: Diagnosis not present

## 2020-10-13 DIAGNOSIS — D251 Intramural leiomyoma of uterus: Secondary | ICD-10-CM | POA: Insufficient documentation

## 2020-10-13 DIAGNOSIS — C541 Malignant neoplasm of endometrium: Secondary | ICD-10-CM | POA: Diagnosis not present

## 2020-10-13 DIAGNOSIS — Z888 Allergy status to other drugs, medicaments and biological substances status: Secondary | ICD-10-CM | POA: Diagnosis not present

## 2020-10-13 DIAGNOSIS — N800 Endometriosis of uterus: Secondary | ICD-10-CM | POA: Diagnosis not present

## 2020-10-13 DIAGNOSIS — G4730 Sleep apnea, unspecified: Secondary | ICD-10-CM | POA: Diagnosis not present

## 2020-10-13 DIAGNOSIS — N7001 Acute salpingitis: Secondary | ICD-10-CM | POA: Diagnosis not present

## 2020-10-13 DIAGNOSIS — N888 Other specified noninflammatory disorders of cervix uteri: Secondary | ICD-10-CM | POA: Diagnosis not present

## 2020-10-13 DIAGNOSIS — E785 Hyperlipidemia, unspecified: Secondary | ICD-10-CM | POA: Diagnosis not present

## 2020-10-13 DIAGNOSIS — Z8616 Personal history of COVID-19: Secondary | ICD-10-CM | POA: Diagnosis not present

## 2020-10-13 DIAGNOSIS — D259 Leiomyoma of uterus, unspecified: Secondary | ICD-10-CM | POA: Diagnosis not present

## 2020-10-13 DIAGNOSIS — Z6841 Body Mass Index (BMI) 40.0 and over, adult: Secondary | ICD-10-CM | POA: Insufficient documentation

## 2020-10-13 DIAGNOSIS — I10 Essential (primary) hypertension: Secondary | ICD-10-CM | POA: Diagnosis not present

## 2020-10-13 HISTORY — PX: ROBOTIC ASSISTED TOTAL HYSTERECTOMY WITH BILATERAL SALPINGO OOPHERECTOMY: SHX6086

## 2020-10-13 LAB — POCT I-STAT 7, (LYTES, BLD GAS, ICA,H+H)
Acid-Base Excess: 0 mmol/L (ref 0.0–2.0)
Bicarbonate: 26.8 mmol/L (ref 20.0–28.0)
Calcium, Ion: 1.43 mmol/L — ABNORMAL HIGH (ref 1.15–1.40)
HCT: 34 % — ABNORMAL LOW (ref 36.0–46.0)
Hemoglobin: 11.6 g/dL — ABNORMAL LOW (ref 12.0–15.0)
O2 Saturation: 95 %
Patient temperature: 36.1
Potassium: 4 mmol/L (ref 3.5–5.1)
Sodium: 140 mmol/L (ref 135–145)
TCO2: 28 mmol/L (ref 22–32)
pCO2 arterial: 51.8 mmHg — ABNORMAL HIGH (ref 32.0–48.0)
pH, Arterial: 7.317 — ABNORMAL LOW (ref 7.350–7.450)
pO2, Arterial: 82 mmHg — ABNORMAL LOW (ref 83.0–108.0)

## 2020-10-13 LAB — BASIC METABOLIC PANEL
Anion gap: 9 (ref 5–15)
BUN: 24 mg/dL — ABNORMAL HIGH (ref 8–23)
CO2: 26 mmol/L (ref 22–32)
Calcium: 10.5 mg/dL — ABNORMAL HIGH (ref 8.9–10.3)
Chloride: 107 mmol/L (ref 98–111)
Creatinine, Ser: 0.99 mg/dL (ref 0.44–1.00)
GFR, Estimated: >60 mL/min (ref >60)
Glucose, Bld: 176 mg/dL — ABNORMAL HIGH (ref 70–99)
Potassium: 3.7 mmol/L (ref 3.5–5.1)
Sodium: 142 mmol/L (ref 135–145)

## 2020-10-13 LAB — CBC WITH DIFFERENTIAL/PLATELET
Abs Immature Granulocytes: 0.07 10*3/uL (ref 0.00–0.07)
Basophils Absolute: 0.1 10*3/uL (ref 0.0–0.1)
Basophils Relative: 1 %
Eosinophils Absolute: 0.3 10*3/uL (ref 0.0–0.5)
Eosinophils Relative: 2 %
HCT: 40.1 % (ref 36.0–46.0)
Hemoglobin: 12.7 g/dL (ref 12.0–15.0)
Immature Granulocytes: 1 %
Lymphocytes Relative: 17 %
Lymphs Abs: 2.4 10*3/uL (ref 0.7–4.0)
MCH: 27.3 pg (ref 26.0–34.0)
MCHC: 31.7 g/dL (ref 30.0–36.0)
MCV: 86.2 fL (ref 80.0–100.0)
Monocytes Absolute: 0.9 10*3/uL (ref 0.1–1.0)
Monocytes Relative: 7 %
Neutro Abs: 10 10*3/uL — ABNORMAL HIGH (ref 1.7–7.7)
Neutrophils Relative %: 72 %
Platelets: UNDETERMINED 10*3/uL (ref 150–400)
RBC: 4.65 MIL/uL (ref 3.87–5.11)
RDW: 14.8 % (ref 11.5–15.5)
WBC: 13.8 10*3/uL — ABNORMAL HIGH (ref 4.0–10.5)
nRBC: 0 % (ref 0.0–0.2)

## 2020-10-13 LAB — GLUCOSE, CAPILLARY
Glucose-Capillary: 215 mg/dL — ABNORMAL HIGH (ref 70–99)
Glucose-Capillary: 197 mg/dL — ABNORMAL HIGH (ref 70–99)
Glucose-Capillary: 200 mg/dL — ABNORMAL HIGH (ref 70–99)

## 2020-10-13 LAB — TYPE AND SCREEN
ABO/RH(D): B POS
Antibody Screen: NEGATIVE

## 2020-10-13 SURGERY — HYSTERECTOMY, TOTAL, ROBOT-ASSISTED, LAPAROSCOPIC, WITH BILATERAL SALPINGO-OOPHORECTOMY
Anesthesia: General

## 2020-10-13 MED ORDER — STERILE WATER FOR IRRIGATION IR SOLN
Status: DC | PRN
  Administered 2020-10-13: 1000 mL

## 2020-10-13 MED ORDER — LEVONORGESTREL 20 MCG/24HR IU IUD
INTRAUTERINE_SYSTEM | INTRAUTERINE | Status: DC
  Filled 2020-10-13 (×2): qty 1

## 2020-10-13 MED ORDER — LABETALOL HCL 5 MG/ML IV SOLN
INTRAVENOUS | Status: AC
  Administered 2020-10-13: 5 mg via INTRAVENOUS
  Filled 2020-10-13: qty 4

## 2020-10-13 MED ORDER — ROCURONIUM BROMIDE 10 MG/ML (PF) SYRINGE
PREFILLED_SYRINGE | INTRAVENOUS | Status: DC | PRN
  Administered 2020-10-13: 20 mg via INTRAVENOUS
  Administered 2020-10-13: 60 mg via INTRAVENOUS
  Administered 2020-10-13: 20 mg via INTRAVENOUS

## 2020-10-13 MED ORDER — GABAPENTIN 300 MG PO CAPS
300.0000 mg | ORAL_CAPSULE | ORAL | Status: AC
  Administered 2020-10-13: 300 mg via ORAL
  Filled 2020-10-13: qty 1

## 2020-10-13 MED ORDER — LIDOCAINE HCL (PF) 2 % IJ SOLN
INTRAMUSCULAR | Status: AC
  Filled 2020-10-13: qty 5

## 2020-10-13 MED ORDER — PROPOFOL 10 MG/ML IV BOLUS
INTRAVENOUS | Status: AC
  Filled 2020-10-13: qty 20

## 2020-10-13 MED ORDER — CHLORHEXIDINE GLUCONATE 0.12 % MT SOLN
15.0000 mL | Freq: Once | OROMUCOSAL | Status: AC
  Administered 2020-10-13: 15 mL via OROMUCOSAL

## 2020-10-13 MED ORDER — MIDAZOLAM HCL 2 MG/2ML IJ SOLN
INTRAMUSCULAR | Status: AC
  Filled 2020-10-13: qty 2

## 2020-10-13 MED ORDER — ACETAMINOPHEN 325 MG PO TABS: 325.0000 mg | ORAL_TABLET | Freq: Once | ORAL | Status: DC | PRN

## 2020-10-13 MED ORDER — AMISULPRIDE (ANTIEMETIC) 5 MG/2ML IV SOLN: 10.0000 mg | Freq: Once | INTRAVENOUS | Status: DC | PRN

## 2020-10-13 MED ORDER — ALBUMIN HUMAN 5 % IV SOLN: INTRAVENOUS | Status: DC | PRN

## 2020-10-13 MED ORDER — ORAL CARE MOUTH RINSE: 15.0000 mL | Freq: Once | OROMUCOSAL | Status: AC

## 2020-10-13 MED ORDER — ACETAMINOPHEN 10 MG/ML IV SOLN: 1000.0000 mg | Freq: Once | INTRAVENOUS | Status: DC | PRN

## 2020-10-13 MED ORDER — LACTATED RINGERS IV SOLN: INTRAVENOUS | Status: DC

## 2020-10-13 MED ORDER — SODIUM CHLORIDE 0.9% FLUSH: 3.0000 mL | Freq: Two times a day (BID) | INTRAVENOUS | Status: DC

## 2020-10-13 MED ORDER — SCOPOLAMINE 1 MG/3DAYS TD PT72
1.0000 | MEDICATED_PATCH | TRANSDERMAL | Status: DC
  Administered 2020-10-13: 1.5 mg via TRANSDERMAL
  Filled 2020-10-13: qty 1

## 2020-10-13 MED ORDER — LIDOCAINE 2% (20 MG/ML) 5 ML SYRINGE
INTRAMUSCULAR | Status: DC | PRN
  Administered 2020-10-13: 40 mg via INTRAVENOUS

## 2020-10-13 MED ORDER — ACETAMINOPHEN 500 MG PO TABS
1000.0000 mg | ORAL_TABLET | ORAL | Status: AC
  Administered 2020-10-13: 1000 mg via ORAL
  Filled 2020-10-13: qty 2

## 2020-10-13 MED ORDER — FENTANYL CITRATE (PF) 100 MCG/2ML IJ SOLN
25.0000 ug | INTRAMUSCULAR | Status: DC | PRN
Start: 2020-10-13 — End: 2020-10-13
  Administered 2020-10-13: 50 ug via INTRAVENOUS

## 2020-10-13 MED ORDER — SUGAMMADEX SODIUM 200 MG/2ML IV SOLN
INTRAVENOUS | Status: DC | PRN
  Administered 2020-10-13: 350 mg via INTRAVENOUS

## 2020-10-13 MED ORDER — ROCURONIUM BROMIDE 10 MG/ML (PF) SYRINGE
PREFILLED_SYRINGE | INTRAVENOUS | Status: AC
  Filled 2020-10-13: qty 10

## 2020-10-13 MED ORDER — LABETALOL HCL 5 MG/ML IV SOLN
5.0000 mg | INTRAVENOUS | Status: AC | PRN
  Administered 2020-10-13: 5 mg via INTRAVENOUS

## 2020-10-13 MED ORDER — SUCCINYLCHOLINE CHLORIDE 200 MG/10ML IV SOSY
PREFILLED_SYRINGE | INTRAVENOUS | Status: DC | PRN
  Administered 2020-10-13: 120 mg via INTRAVENOUS

## 2020-10-13 MED ORDER — BUPIVACAINE HCL 0.25 % IJ SOLN
INTRAMUSCULAR | Status: DC | PRN
  Administered 2020-10-13: 30 mL

## 2020-10-13 MED ORDER — ENOXAPARIN SODIUM 40 MG/0.4ML ~~LOC~~ SOLN
40.0000 mg | SUBCUTANEOUS | Status: AC
  Administered 2020-10-13: 40 mg via SUBCUTANEOUS
  Filled 2020-10-13: qty 0.4

## 2020-10-13 MED ORDER — PHENYLEPHRINE 40 MCG/ML (10ML) SYRINGE FOR IV PUSH (FOR BLOOD PRESSURE SUPPORT)
PREFILLED_SYRINGE | INTRAVENOUS | Status: DC | PRN
  Administered 2020-10-13 (×2): 80 ug via INTRAVENOUS

## 2020-10-13 MED ORDER — SUGAMMADEX SODIUM 500 MG/5ML IV SOLN
INTRAVENOUS | Status: AC
  Filled 2020-10-13: qty 5

## 2020-10-13 MED ORDER — FENTANYL CITRATE (PF) 250 MCG/5ML IJ SOLN
INTRAMUSCULAR | Status: AC
  Filled 2020-10-13: qty 5

## 2020-10-13 MED ORDER — ACETAMINOPHEN 160 MG/5ML PO SOLN
325.0000 mg | Freq: Once | ORAL | Status: DC | PRN
Start: 2020-10-13 — End: 2020-10-13

## 2020-10-13 MED ORDER — ONDANSETRON HCL 4 MG/2ML IJ SOLN
INTRAMUSCULAR | Status: AC
  Filled 2020-10-13: qty 2

## 2020-10-13 MED ORDER — LACTATED RINGERS IR SOLN
Status: DC | PRN
  Administered 2020-10-13: 1000 mL

## 2020-10-13 MED ORDER — ONDANSETRON HCL 4 MG/2ML IJ SOLN
INTRAMUSCULAR | Status: DC | PRN
  Administered 2020-10-13: 4 mg via INTRAVENOUS

## 2020-10-13 MED ORDER — LIDOCAINE 2% (20 MG/ML) 5 ML SYRINGE
INTRAMUSCULAR | Status: DC | PRN
  Administered 2020-10-13: 1.5 mg/kg/h via INTRAVENOUS

## 2020-10-13 MED ORDER — FENTANYL CITRATE (PF) 100 MCG/2ML IJ SOLN
INTRAMUSCULAR | Status: AC
  Administered 2020-10-13: 50 ug via INTRAVENOUS
  Filled 2020-10-13: qty 2

## 2020-10-13 MED ORDER — INSULIN ASPART 100 UNIT/ML ~~LOC~~ SOLN
SUBCUTANEOUS | Status: AC
  Administered 2020-10-13: 5 [IU] via INTRAVENOUS
  Filled 2020-10-13: qty 1

## 2020-10-13 MED ORDER — FENTANYL CITRATE (PF) 100 MCG/2ML IJ SOLN
INTRAMUSCULAR | Status: AC
  Administered 2020-10-13: 25 ug via INTRAVENOUS
  Filled 2020-10-13: qty 2

## 2020-10-13 MED ORDER — INSULIN ASPART 100 UNIT/ML IV SOLN: 5.0000 [IU] | Freq: Once | INTRAVENOUS | Status: AC

## 2020-10-13 MED ORDER — BUPIVACAINE LIPOSOME 1.3 % IJ SUSP
20.0000 mL | Freq: Once | INTRAMUSCULAR | Status: AC
  Administered 2020-10-13: 20 mL
  Filled 2020-10-13: qty 20

## 2020-10-13 MED ORDER — LACTATED RINGERS IV SOLN: INTRAVENOUS | Status: DC | PRN

## 2020-10-13 MED ORDER — LIDOCAINE HCL 2 % IJ SOLN
INTRAMUSCULAR | Status: AC
  Filled 2020-10-13: qty 20

## 2020-10-13 MED ORDER — DEXAMETHASONE SODIUM PHOSPHATE 10 MG/ML IJ SOLN
INTRAMUSCULAR | Status: AC
  Filled 2020-10-13: qty 1

## 2020-10-13 MED ORDER — MIDAZOLAM HCL 2 MG/2ML IJ SOLN
INTRAMUSCULAR | Status: DC | PRN
  Administered 2020-10-13: 2 mg via INTRAVENOUS

## 2020-10-13 MED ORDER — MEPERIDINE HCL 50 MG/ML IJ SOLN: 6.2500 mg | INTRAMUSCULAR | Status: DC | PRN

## 2020-10-13 MED ORDER — BUPIVACAINE HCL 0.25 % IJ SOLN
INTRAMUSCULAR | Status: AC
  Filled 2020-10-13: qty 1

## 2020-10-13 MED ORDER — PROPOFOL 10 MG/ML IV BOLUS
INTRAVENOUS | Status: DC | PRN
  Administered 2020-10-13: 140 mg via INTRAVENOUS

## 2020-10-13 MED ORDER — FENTANYL CITRATE (PF) 250 MCG/5ML IJ SOLN
INTRAMUSCULAR | Status: DC | PRN
  Administered 2020-10-13 (×4): 50 ug via INTRAVENOUS
  Administered 2020-10-13: 100 ug via INTRAVENOUS
  Administered 2020-10-13: 50 ug via INTRAVENOUS

## 2020-10-13 MED ORDER — DEXAMETHASONE SODIUM PHOSPHATE 10 MG/ML IJ SOLN
INTRAMUSCULAR | Status: DC | PRN
  Administered 2020-10-13: 10 mg via INTRAVENOUS

## 2020-10-13 MED ORDER — FENTANYL CITRATE (PF) 100 MCG/2ML IJ SOLN
INTRAMUSCULAR | Status: AC
  Filled 2020-10-13: qty 2

## 2020-10-13 MED ORDER — ALBUMIN HUMAN 5 % IV SOLN
INTRAVENOUS | Status: AC
  Filled 2020-10-13: qty 250

## 2020-10-13 SURGICAL SUPPLY — 87 items
APPLICATOR SURGIFLO ENDO (HEMOSTASIS) IMPLANT
BACTOSHIELD CHG 4% 4OZ (MISCELLANEOUS) ×1
BAG LAPAROSCOPIC 12 15 PORT 16 (BASKET) ×2 IMPLANT
BAG RETRIEVAL 12/15 (BASKET) ×3
BLADE SURG SZ10 CARB STEEL (BLADE) ×3 IMPLANT
CATH ROBINSON RED A/P 16FR (CATHETERS) ×3 IMPLANT
CELLS DAT CNTRL 66122 CELL SVR (MISCELLANEOUS) IMPLANT
COVER BACK TABLE 60X90IN (DRAPES) ×3 IMPLANT
COVER TIP SHEARS 8 DVNC (MISCELLANEOUS) ×2 IMPLANT
COVER TIP SHEARS 8MM DA VINCI (MISCELLANEOUS) ×3
COVER WAND RF STERILE (DRAPES) IMPLANT
DECANTER SPIKE VIAL GLASS SM (MISCELLANEOUS) ×3 IMPLANT
DERMABOND ADVANCED (GAUZE/BANDAGES/DRESSINGS) ×1
DERMABOND ADVANCED .7 DNX12 (GAUZE/BANDAGES/DRESSINGS) ×2 IMPLANT
DRAPE ARM DVNC X/XI (DISPOSABLE) ×8 IMPLANT
DRAPE COLUMN DVNC XI (DISPOSABLE) ×2 IMPLANT
DRAPE DA VINCI XI ARM (DISPOSABLE) ×12
DRAPE DA VINCI XI COLUMN (DISPOSABLE) ×3
DRAPE SHEET LG 3/4 BI-LAMINATE (DRAPES) ×3 IMPLANT
DRAPE SURG IRRIG POUCH 19X23 (DRAPES) ×3 IMPLANT
DRAPE UNDERBUTTOCKS STRL (DISPOSABLE) ×3 IMPLANT
DRSG OPSITE POSTOP 4X6 (GAUZE/BANDAGES/DRESSINGS) ×3 IMPLANT
DRSG OPSITE POSTOP 4X8 (GAUZE/BANDAGES/DRESSINGS) IMPLANT
DRSG TELFA 3X8 NADH (GAUZE/BANDAGES/DRESSINGS) ×3 IMPLANT
ELECT PENCIL ROCKER SW 15FT (MISCELLANEOUS) ×3 IMPLANT
ELECT REM PT RETURN 15FT ADLT (MISCELLANEOUS) ×3 IMPLANT
GAUZE 4X4 16PLY RFD (DISPOSABLE) ×3 IMPLANT
GLOVE SURG ENC MOIS LTX SZ6 (GLOVE) ×12 IMPLANT
GLOVE SURG ENC MOIS LTX SZ6.5 (GLOVE) ×6 IMPLANT
GOWN STRL REUS W/ TWL LRG LVL3 (GOWN DISPOSABLE) ×8 IMPLANT
GOWN STRL REUS W/TWL LRG LVL3 (GOWN DISPOSABLE) ×12
HOLDER FOLEY CATH W/STRAP (MISCELLANEOUS) IMPLANT
IRRIG SUCT STRYKERFLOW 2 WTIP (MISCELLANEOUS) ×3
IRRIGATION SUCT STRKRFLW 2 WTP (MISCELLANEOUS) ×2 IMPLANT
KIT BASIN OR (CUSTOM PROCEDURE TRAY) ×3 IMPLANT
KIT PROCEDURE DA VINCI SI (MISCELLANEOUS)
KIT PROCEDURE DVNC SI (MISCELLANEOUS) IMPLANT
KIT TURNOVER KIT A (KITS) ×3 IMPLANT
MANIPULATOR UTERINE 4.5 ZUMI (MISCELLANEOUS) ×3 IMPLANT
NEEDLE HYPO 22GX1.5 SAFETY (NEEDLE) ×6 IMPLANT
NEEDLE SPNL 18GX3.5 QUINCKE PK (NEEDLE) IMPLANT
NEEDLE SPNL 22GX3.5 QUINCKE BK (NEEDLE) ×3 IMPLANT
NS IRRIG 1000ML POUR BTL (IV SOLUTION) ×3 IMPLANT
OBTURATOR OPTICAL STANDARD 8MM (TROCAR) ×3
OBTURATOR OPTICAL STND 8 DVNC (TROCAR) ×2
OBTURATOR OPTICALSTD 8 DVNC (TROCAR) ×2 IMPLANT
PACK LITHOTOMY IV (CUSTOM PROCEDURE TRAY) ×3 IMPLANT
PACK ROBOT GYN CUSTOM WL (TRAY / TRAY PROCEDURE) ×3 IMPLANT
PAD OB MATERNITY 4.3X12.25 (PERSONAL CARE ITEMS) ×3 IMPLANT
PAD POSITIONING PINK XL (MISCELLANEOUS) ×3 IMPLANT
PENCIL SMOKE EVACUATOR (MISCELLANEOUS) IMPLANT
PORT ACCESS TROCAR AIRSEAL 12 (TROCAR) ×2 IMPLANT
PORT ACCESS TROCAR AIRSEAL 5M (TROCAR) ×1
POUCH SPECIMEN RETRIEVAL 10MM (ENDOMECHANICALS) IMPLANT
RETRACTOR LAPSCP 12X46 CVD (ENDOMECHANICALS) ×2 IMPLANT
RETRACTOR WND ALEXIS 25 LRG (MISCELLANEOUS) IMPLANT
RTRCTR LAPSCP 12X46 CVD (ENDOMECHANICALS) ×3
RTRCTR WOUND ALEXIS 18CM MED (MISCELLANEOUS)
RTRCTR WOUND ALEXIS 25CM LRG (MISCELLANEOUS)
SCRUB CHG 4% DYNA-HEX 4OZ (MISCELLANEOUS) ×2 IMPLANT
SEAL CANN UNIV 5-8 DVNC XI (MISCELLANEOUS) ×8 IMPLANT
SEAL XI 5MM-8MM UNIVERSAL (MISCELLANEOUS) ×12
SET TRI-LUMEN FLTR TB AIRSEAL (TUBING) ×3 IMPLANT
SPONGE LAP 18X18 RF (DISPOSABLE) ×3 IMPLANT
SURGIFLO W/THROMBIN 8M KIT (HEMOSTASIS) IMPLANT
SURGILUBE 2OZ TUBE FLIPTOP (MISCELLANEOUS) ×3 IMPLANT
SUT MNCRL AB 4-0 PS2 18 (SUTURE) ×3 IMPLANT
SUT PDS AB 1 TP1 96 (SUTURE) IMPLANT
SUT VIC AB 0 CT1 27 (SUTURE)
SUT VIC AB 0 CT1 27XBRD ANTBC (SUTURE) IMPLANT
SUT VIC AB 2-0 CT1 27 (SUTURE)
SUT VIC AB 2-0 CT1 TAPERPNT 27 (SUTURE) IMPLANT
SUT VIC AB 3-0 CTX 36 (SUTURE) ×3 IMPLANT
SUT VIC AB 4-0 PS2 18 (SUTURE) ×9 IMPLANT
SUT VLOC 180 0 6IN GS21 (SUTURE) ×6 IMPLANT
SYR 10ML LL (SYRINGE) IMPLANT
SYR 20ML LL LF (SYRINGE) IMPLANT
SYR 50ML LL SCALE MARK (SYRINGE) IMPLANT
SYR BULB IRRIG 60ML STRL (SYRINGE) ×3 IMPLANT
TOWEL OR 17X26 10 PK STRL BLUE (TOWEL DISPOSABLE) ×3 IMPLANT
TOWEL OR NON WOVEN STRL DISP B (DISPOSABLE) ×3 IMPLANT
TRAP SPECIMEN MUCUS 40CC (MISCELLANEOUS) IMPLANT
TRAY FOLEY MTR SLVR 16FR STAT (SET/KITS/TRAYS/PACK) ×3 IMPLANT
TROCAR XCEL NON-BLD 5MMX100MML (ENDOMECHANICALS) IMPLANT
UNDERPAD 30X36 HEAVY ABSORB (UNDERPADS AND DIAPERS) ×6 IMPLANT
WATER STERILE IRR 1000ML POUR (IV SOLUTION) ×3 IMPLANT
YANKAUER SUCT BULB TIP 10FT TU (MISCELLANEOUS) ×3 IMPLANT

## 2020-10-13 NOTE — Anesthesia Procedure Notes (Signed)
Procedure Name: Intubation Date/Time: 10/13/2020 11:19 AM Performed by: Sharlette Dense, CRNA Patient Re-evaluated:Patient Re-evaluated prior to induction Oxygen Delivery Method: Circle system utilized Preoxygenation: Pre-oxygenation with 100% oxygen Induction Type: IV induction Ventilation: Mask ventilation without difficulty and Oral airway inserted - appropriate to patient size Laryngoscope Size: Miller and 2 Grade View: Grade I Tube size: 7.5 mm Number of attempts: 2 Airway Equipment and Method: Stylet Placement Confirmation: ETT inserted through vocal cords under direct vision,  positive ETCO2 and breath sounds checked- equal and bilateral Secured at: 22 cm Tube secured with: Tape Dental Injury: Teeth and Oropharynx as per pre-operative assessment

## 2020-10-13 NOTE — Transfer of Care (Signed)
Immediate Anesthesia Transfer of Care Note  Patient: Stacey Wise  Procedure(s) Performed: XI ROBOTIC ASSISTED TOTAL HYSTERECTOMY WITH BILATERAL SALPINGO OOPHORECTOMY GREATER THAN 250GMS, MINI LAPAROTOMY (Bilateral )  Patient Location: PACU  Anesthesia Type:General  Level of Consciousness: awake  Airway & Oxygen Therapy: Patient Spontanous Breathing and Patient connected to face mask oxygen  Post-op Assessment: Report given to RN and Post -op Vital signs reviewed and stable  Post vital signs: Reviewed and stable  Last Vitals:  Vitals Value Taken Time  BP 178/71 10/13/20 1437  Temp    Pulse 92 10/13/20 1439  Resp 17 10/13/20 1439  SpO2 100 % 10/13/20 1439  Vitals shown include unvalidated device data.  Last Pain:  Vitals:   10/13/20 0905  TempSrc:   PainSc: 0-No pain         Complications: No complications documented.

## 2020-10-13 NOTE — Anesthesia Procedure Notes (Signed)
Arterial Line Insertion Start/End2/15/2022 11:28 AM, 10/13/2020 11:30 AM Performed by: Effie Berkshire, MD, anesthesiologist  Patient location: Pre-op. Preanesthetic checklist: patient identified, IV checked, site marked, risks and benefits discussed, surgical consent, monitors and equipment checked, pre-op evaluation, timeout performed and anesthesia consent Lidocaine 1% used for infiltration Right, radial was placed Catheter size: 20 Fr Hand hygiene performed  and maximum sterile barriers used   Attempts: 1 Procedure performed without using ultrasound guided technique. Following insertion, dressing applied and Biopatch. Post procedure assessment: normal and unchanged  Patient tolerated the procedure well with no immediate complications.

## 2020-10-13 NOTE — Op Note (Signed)
OPERATIVE NOTE 10/13/20  Surgeon: Donaciano Eva   Assistants: Dr Lahoma Crocker (an MD assistant was necessary for tissue manipulation, management of robotic instrumentation, retraction and positioning due to the complexity of the case and hospital policies).   Anesthesia: General endotracheal anesthesia  ASA Class: 3   Pre-operative Diagnosis: endometrial cancer, morbid obesity, fibroid uterus  Post-operative Diagnosis: same  Operation: Robotic-assisted laparoscopic total hysterectomy >250gm with bilateral salpingoophorectomy (22 modifier for case complexity extreme obesity, BMI 58, with significant retroperitoneal and intraperitoneal adiposity. Obesity necessitated an additional 90 minutes of operating time to create safe exposure. Obesity required additional personnel in the operating room for positioning and retraction. Severe obesity substantially increased the complexity of the procedure).    Surgeon: Donaciano Eva  Assistant Surgeon: Lahoma Crocker MD  Anesthesia: GET  Urine Output: 250cc  Operative Findings:  : 16cm bulky fibroid uterus, normal appearing tubes and ovaries, small umbilical hernia without incarceration of visceral structures, extreme intraperitoneal adiposity and difficulty visualizing pelvic structures.   Estimated Blood Loss:  150cc      Total IV Fluids: 1,000 ml         Specimens: uterus with cervix and bilateral tubes and ovaries         Complications:  None; patient tolerated the procedure well.         Disposition: PACU - hemodynamically stable.  Procedure Details  The patient was seen in the Holding Room. The risks, benefits, complications, treatment options, and expected outcomes were discussed with the patient.  The patient concurred with the proposed plan, giving informed consent.  The site of surgery properly noted/marked. The patient was identified as Avarose Mervine and the procedure verified as a Robotic-assisted  hysterectomy with bilateral salpingo oophorectomy. A Time Out was held and the above information confirmed.  After induction of anesthesia, the patient was draped and prepped in the usual sterile manner. Pt was placed in supine position after anesthesia and draped and prepped in the usual sterile manner. The abdominal drape was placed after the CholoraPrep had been allowed to dry for 3 minutes.  Her arms were tucked to her side with all appropriate precautions.  The shoulders were stabilized with padded shoulder blocks applied to the acromium processes.  The patient was placed in the semi-lithotomy position in [place].  The perineum was prepped with Betadine. The patient was then prepped. Foley catheter was placed.  A sterile speculum was placed in the vagina.  The cervix was grasped with a single-tooth tenaculum and dilated with Kennon Rounds dilators.  The ZUMI uterine manipulator with a medium colpotomizer ring was placed without difficulty.  A pneum occluder balloon was placed over the manipulator.  OG tube placement was confirmed and to suction.   A tilt test was performed to ensure that she could tolerate steep trendelenberg and to ensure positioning remained stable.   Next, a 5 mm skin incision was made 1 cm below the subcostal margin in the midclavicular line.  The 5 mm Optiview port and scope was used for direct entry.  Opening pressure was under 10 mm CO2.  The abdomen was insufflated and the findings were noted as above.   At this point and all points during the procedure, the patient's intra-abdominal pressure did not exceed 15 mmHg. Next, a 10 mm skin incision was made 44YJ above the umbilicus and a right and left port was placed about 10 cm lateral to the robot port on the right and left side.  A fourth  arm was placed in the right lower quadrant 2 cm above and superior and medial to the anterior superior iliac spine. An additional 46mm left lateral upper quadrant port was placed to facilitate  additional retraction due to the extreme adiposity.   All ports were placed under direct visualization.  The patient was placed in steep Trendelenburg.  Bowel was folded away into the upper abdomen.  The robot was docked in the normal manner.  An endopaddle was necessary to use for retraction due to the high volume of bowel tissue from adiposity.  The hysterectomy was started after the round ligament on the right side was incised and the retroperitoneum was entered and the pararectal space was developed.  The ureter was noted to be on the medial leaf of the broad ligament.  The peritoneum above the ureter was incised and stretched and the infundibulopelvic ligament was skeletonized, cauterized and cut.  The posterior peritoneum was taken down to the level of the KOH ring.  The anterior peritoneum was also taken down.  The bladder flap was created to the level of the KOH ring.  The uterine artery on the right side was skeletonized, cauterized and cut in the normal manner.  A similar procedure was performed on the left.  The colpotomy was made and the uterus, cervix, bilateral ovaries and tubes were amputated and placed in an endocatch bag.  Pedicles were inspected and excellent hemostasis was achieved.    The colpotomy at the vaginal cuff was closed with 2 sutures of 0 v-loc on a CT1 needle in a running manner.  Irrigation was used and excellent hemostasis was achieved.  At this point in the procedure was completed.  Robotic instruments were removed under direct visulaization.  The robot was undocked.   A 6cm epigastric vertical incision was made with the scalpel. The subcutaneous skin was opened with the bovie. The fascia was opened with the bovie vertically and the peritoneum was opened sharply in the midline. The peritoneal incision was extended. The uterine specimen in the endocatch bag was retrieved through the incision. The fascia was closed with running looped #1 PDS in 2 running sutures with a mass  closure. The subcutaneous fat was closed with 2-0 vicryl. 20cc of exparel mixed with 20cc of marcaine was infiltrated into the incision. The incision was closed at the skin with monocryl and dermabond.   The 10 mm ports were closed with Vicryl on a UR-5 needle and the fascia was closed with 0 Vicryl on a UR-5 needle.  The skin was closed with 4-0 Vicryl in a subcuticular manner.  Dermabond was applied.  Sponge, lap and needle counts correct x 2.  The patient was taken to the recovery room in stable condition.  The vagina was swabbed with  minimal bleeding noted.   A running 4-0 vicryl was used to reapproximate the edges. All instrument and needle counts were correct x  3.   The patient was transferred to the recovery room in a stable condition.  Donaciano Eva, MD

## 2020-10-13 NOTE — Discharge Instructions (Signed)
Return to work: 4 weeks (2 weeks with physical restrictions).  Activity: 1. Be up and out of the bed during the day.  Take a nap if needed.  You may walk up steps but be careful and use the hand rail.  Stair climbing will tire you more than you think, you may need to stop part way and rest.   2. No lifting or straining for 4 weeks.  3. No driving for 1 weeks.  Do Not drive if you are taking narcotic pain medicine.  4. Shower daily.  Use soap and water on your incision and pat dry; don't rub.   5. No sexual activity and nothing in the vagina for 8 weeks.  Medications:  - Take ibuprofen and tylenol first line for pain control. Take these regularly (every 6 hours) to decrease the build up of pain.  - If necessary, for severe pain not relieved by ibuprofen, contact Dr Rossi's office and you will be prescribed percocet.  - While taking percocet you should take sennakot every night to reduce the likelihood of constipation. If this causes diarrhea, stop its use.  Diet: 1. Low sodium Heart Healthy Diet is recommended.  2. It is safe to use a laxative if you have difficulty moving your bowels.   Wound Care: 1. Keep clean and dry.  Shower daily.  Reasons to call the Doctor:  Fever - Oral temperature greater than 100.4 degrees Fahrenheit Foul-smelling vaginal discharge Difficulty urinating Nausea and vomiting Increased pain at the site of the incision that is unrelieved with pain medicine. Difficulty breathing with or without chest pain New calf pain especially if only on one side Sudden, continuing increased vaginal bleeding with or without clots.   Follow-up: 1. See Emma Rossi in 4 weeks.  Contacts: For questions or concerns you should contact:  Dr. Emma Rossi at [phone] After hours and on week-ends call [phone] and ask to speak to the physician on call for Gynecologic Oncology  

## 2020-10-13 NOTE — H&P (Signed)
Follow-up Note: Gyn-Onc   Stacey Wise 65 y.o. female  Chief Complaint: Endometrial Cancer  Assessment and Plan :Grade 1 endometrial adenocarcinoma. Morbid obesity and other comorbid medical conditions including poorly controlled diabetes hypertension and hypercalcemia. S/p 5 years of Mirena IUD - needs either replacement or hysterectomy. We are not able to sample in the office due to anatomic constraints.  She requires either hysterectomy or D&C and replacement of Mirena IUD for both diagnostic and therapeutic purposes.  Her HbA1c is 7.3% in December, 2021.  SARS-Cov 19 illness in January, 2022 - now recovered however this caused delays in scheduling.  Remains a high risk operative candidate for deep pelvic surgery. I explained that the issue was exposure to the deep pelvic organs is limited with such extensive visceral adiposity and her airway was compromised during the last anesthesia (difficult intubation).  We will attempt robotic hysterectomy for a uterus >250gm with BSO and minilaparotomy for specimen delivery, with the caveat being that if her obesity prevents her tolerating steep Trendelenburg or visualization of the pelvic anatomy, hysterectomy will be aborted and we will proceed with D&C and replacement of a new Mirena IUD.  HPI: 65 year old Afro-American female seen in consultation request of Dr. Vanessa Kick regarding management of a newly diagnosed endometrial cancer. The patient's had a long history of irregular bleeding which is gradually become heavier. Patient underwent hysteroscopy and endometrial biopsy showing a grade 1 endometrial adenocarcinoma. Subsequently the patient's had minimal bleeding. Ultrasound showing the patient has fibroids.  Patient denies any past gynecologic history. She has never been pregnant.  Patient has number of morbid conditions including a BMI of 55, hypertension, diabetes, and hypercalcemia.  Management options were considered at the time of  diagnosis and we decided to treat using a Mirena IUD which was placed in November 2017.  Following initial consultation the patient had a Mirena IUD placed. She's had no difficulty with the IUD. She denies any pelvic pain pressure or any vaginal bleeding.  The patient underwent D&C on 01/19/17. Mirena IUD was kept in situ during the procedure.  The pathology confirmed persistent grade1-2 endometrial cancer.  She met with diabetes nutritionist and educator, Jeanie Sewer, and has a nutrition plan and she lost 20lbs (approx) in 2 months in 2018. Following that time her weight was stable without additional losses.  TVUS in September, 2019 showed a bulky 13x6x6cm uterus with fibroids and a 21m endometrium. Unable to see IUD due to acoustic shadowing.   After several years of no vaginal bleeding, she developed an episode of vaginal bleeding in August, 2021. A TVUS was ordered and performed on May 18, 2020.  This revealed a uterus measuring 8.2 x 5 x 7.2 cm with intramural fibroids the largest of which measuring 5 cm in the lower uterine segment.  The endometrium could not be visualized due to fibroids.  The IUD was not definitively seen.  The ovaries were not visualized.  HbA1c was drawn on 05/19/20 and was elevated at 8.2%.  Interval history:  She met with an endocrinologist and was started on Trulicity. Her sugars have been lower per patient. She has lost 5 lbs in 2 months.  Her vaginal spotting was lighter.  Review of Systems:10 point review of systems is negative except as noted in interval history.   Vitals: Blood pressure (!) 148/80, pulse 99, temperature 98.3 F (36.8 C), temperature source Oral, resp. rate 18, height _0  (1.626 m), weight (!) 320 lb (145.2 kg), last menstrual period 06/23/2016, SpO2 100 %.  Physical Exam: General : The patient is a healthy woman in no acute distress.  HEENT: normocephalic, extraoccular movements normal; neck is supple without thyromegally   Lynphnodes: Supraclavicular and inguinal nodes not enlarged  Abdomen: Morbidly obese, Soft, non-tender, no ascites, no organomegally, no masses, no hernias  Pelvic:  EGBUS: Normal female  Vagina: Normal, no lesions  Urethra and Bladder: Normal, non-tender   Cervix: unable to see the IUD strings but unable to fully visualize the cervix despite using a very long speculum. Unable to palpate the cervix. It is flush with the vagina.  Uterus: Cannot be assessed given the patient's habitus. Bi-manual examination: Non-tender; no adenxal masses or nodularity  Rectal: normal sphincter tone, no masses, no blood  Lower extremities: No edema or varicosities. Normal range of motion      Allergies  Allergen Reactions  . Empagliflozin-Metformin Hcl     Other reaction(s): headaches and urine frequency    Past Medical History:  Diagnosis Date  . Arthritis   . Cancer ([place])    intrauterine cancer   . Complication of anesthesia    hard to wake up from parathyroidectomy  . Diabetes mellitus without complication ([place])    per patient " i am a type 2 diabetic"  . DIABETIC PERIPHERAL NEUROPATHY 02/28/2007  . DYSLIPIDEMIA 02/28/2007  . Family history of anesthesia complication    sister hard to wake up  . Heel spur, right   . Hepatitis    "states she has only been vaccinated but never had"  . HYPERTENSION 02/28/2007  . Morbid obesity ([place]) 02/28/2007  . Pneumonia    x2  . SLEEP APNEA 02/28/2007   does not use a cpap-could not use  . Trigger finger, left ring finger   . Uterine fibroid     Past Surgical History:  Procedure Laterality Date  . BREAST LUMPECTOMY WITH NEEDLE LOCALIZATION Left 01/01/2013   Procedure: LEFT BREAST LUMPECTOMY WITH NEEDLE LOCALIZATION;  Surgeon: Marcello Moores A. Cornett, MD;  Location: [facility];  Service: General;  Laterality: Left;  . CERVICAL BIOPSY  W/ LOOP ELECTRODE EXCISION    . CERVICAL POLYPECTOMY N/A 06/27/2016   Procedure: CERVICAL POLYPECTOMY;  Surgeon:  Vanessa Kick, MD;  Location: [facility];  Service: Gynecology;  Laterality: N/A;  . DILATION AND CURETTAGE OF UTERUS  2009  . DILATION AND CURETTAGE OF UTERUS N/A 06/27/2016   Procedure: DILATATION AND CURETTAGE;  Surgeon: Vanessa Kick, MD;  Location: [facility];  Service: Gynecology;  Laterality: N/A;  . DILATION AND CURETTAGE OF UTERUS N/A 01/19/2017   Procedure: DILATATION AND CURETTAGE;  Surgeon: Everitt Amber, MD;  Location: [facility];  Service: Gynecology;  Laterality: N/A;  . ELECTROCARDIOGRAM  11/19/2004  . HYSTEROSCOPY N/A 06/27/2016   Procedure: HYSTEROSCOPY;  Surgeon: Vanessa Kick, MD;  Location: [facility];  Service: Gynecology;  Laterality: N/A;  . PARATHYROIDECTOMY Left 06/30/2015   Procedure: LEFT PARATHYROIDECTOMY;  Surgeon: Armandina Gemma, MD;  Location: [facility];  Service: General;  Laterality: Left;  . TUBAL LIGATION  1996    Current Facility-Administered Medications  Medication Dose Route Frequency Provider Last Rate Last Admin  . ceFAZolin (ANCEF) 3 g in dextrose 5 % 50 mL IVPB  3 g Intravenous On Call to OR Everitt Amber, MD      . lactated ringers infusion   Intravenous Continuous Lynda Rainwater, MD 10 mL/hr at 10/13/20 0907 New Bag at 10/13/20 9371  . lactated ringers infusion   Intravenous Continuous Everitt Amber, MD      .  levonorgestrel (MIRENA) 20 MCG/24HR IUD   Intrauterine To OR Cross, Melissa D, NP      . scopolamine (TRANSDERM-SCOP) 1 MG/3DAYS 1.5 mg  1 patch Transdermal On Call to OR [name] D, NP   1.5 mg at 10/13/20 0906    Social History   Socioeconomic History  . Marital status: Single    Spouse name: Not on file  . Number of children: Not on file  . Years of education: Not on file  . Highest education level: Not on file  Occupational History  . Occupation: Sales executive for [employer]: [place]  Tobacco Use  . Smoking status: Never Smoker  . Smokeless tobacco: Never Used  Vaping Use  . Vaping Use: Never used  Substance  and Sexual Activity  . Alcohol use: No  . Drug use: No  . Sexual activity: Not on file  Other Topics Concern  . Not on file  Social History Narrative  . Not on file   Social Determinants of Health   Financial Resource Strain: Not on file  Food Insecurity: Not on file  Transportation Needs: Not on file  Physical Activity: Not on file  Stress: Not on file  Social Connections: Not on file  Intimate Partner Violence: Not on file    Family History  Problem Relation Age of Onset  . Diabetes Mother   . Arthritis Mother   . Hypertension Mother   . Arthritis Father   . Cancer Father        Prostate  . Dementia Father   . Alzheimer's disease Father   . Cancer Sister        Lymphoma  . Arthritis Sister   . Arthritis Brother   . Cancer Sister        Peritoneal  . Arthritis Sister   . Arthritis Sister   . Arthritis Sister   . Arthritis Brother   . Diabetes Other        Sibling  . Diabetes Other        Sibling      Thereasa Solo, MD 10/13/2020, 10:45 AM

## 2020-10-13 NOTE — Anesthesia Preprocedure Evaluation (Addendum)
Anesthesia Evaluation  Patient identified by MRN, date of birth, ID band Patient awake    Reviewed: Allergy & Precautions, NPO status , Patient's Chart, lab work & pertinent test results  Airway Mallampati: I  TM Distance: >3 FB Neck ROM: Full    Dental  (+) Teeth Intact, Dental Advisory Given   Pulmonary sleep apnea ,    breath sounds clear to auscultation       Cardiovascular hypertension, Pt. on medications  Rhythm:Regular Rate:Normal     Neuro/Psych negative neurological ROS  negative psych ROS   GI/Hepatic negative GI ROS, (+) Hepatitis -  Endo/Other  diabetes, Type 2, Insulin Dependent, Oral Hypoglycemic Agents  Renal/GU negative Renal ROS     Musculoskeletal  (+) Arthritis ,   Abdominal (+) + obese,   Peds  Hematology negative hematology ROS (+)   Anesthesia Other Findings   Reproductive/Obstetrics                            Anesthesia Physical Anesthesia Plan  ASA: III  Anesthesia Plan: General   Post-op Pain Management:    Induction: Intravenous  PONV Risk Score and Plan: 4 or greater and Ondansetron, Dexamethasone, Midazolam and Scopolamine patch - Pre-op  Airway Management Planned: Oral ETT  Additional Equipment: None  Intra-op Plan:   Post-operative Plan: Extubation in OR  Informed Consent: I have reviewed the patients History and Physical, chart, labs and discussed the procedure including the risks, benefits and alternatives for the proposed anesthesia with the patient or authorized representative who has indicated his/her understanding and acceptance.     Dental advisory given  Plan Discussed with: CRNA  Anesthesia Plan Comments:        Anesthesia Quick Evaluation

## 2020-10-14 ENCOUNTER — Telehealth: Payer: Self-pay

## 2020-10-14 ENCOUNTER — Encounter (HOSPITAL_COMMUNITY): Payer: Self-pay | Admitting: Gynecologic Oncology

## 2020-10-14 NOTE — Telephone Encounter (Signed)
Stacey Wise states that she is eating,drinking, and urinating well. Passing gas. She will begin the senokot-s 2 tabs bid with 8 oz of water.  If no BM by saturday 10-17-20, she is to add 1 capful of miralax bid. Afebrile. Incisions D&I Told her that she can remove the honeycomb dressing on Sunday 10-18-20. Pain controled with Tylenol and Ibuprofen. Pt aware of post op appointments as well as the office number [phone] and after hours number [phone] to call if she has any questions or concerns

## 2020-10-16 NOTE — Anesthesia Postprocedure Evaluation (Signed)
Anesthesia Post Note  Patient: Stacey Wise  Procedure(s) Performed: XI ROBOTIC ASSISTED TOTAL HYSTERECTOMY WITH BILATERAL SALPINGO OOPHORECTOMY GREATER THAN 250GMS, MINI LAPAROTOMY (Bilateral )     Patient location during evaluation: PACU Anesthesia Type: General Level of consciousness: awake and alert Pain management: pain level controlled Vital Signs Assessment: post-procedure vital signs reviewed and stable Respiratory status: spontaneous breathing, nonlabored ventilation, respiratory function stable and patient connected to nasal cannula oxygen Cardiovascular status: blood pressure returned to baseline and stable Postop Assessment: no apparent nausea or vomiting Anesthetic complications: no   No complications documented.  Last Vitals:  Vitals:   10/13/20 1650 10/13/20 1713  BP: (!) 149/77 (!) 167/80  Pulse: 86 85  Resp: 18 (!) 22  Temp: 36.5 C 36.7 C  SpO2: 97%     Last Pain:  Vitals:   10/13/20 1713  TempSrc: Oral  PainSc: 3                  Effie Berkshire

## 2020-10-19 ENCOUNTER — Encounter: Payer: Self-pay | Admitting: Gynecologic Oncology

## 2020-10-20 ENCOUNTER — Encounter: Payer: Self-pay | Admitting: Nutrition

## 2020-10-20 ENCOUNTER — Inpatient Hospital Stay: Payer: BC Managed Care – PPO | Attending: Gynecologic Oncology | Admitting: Gynecologic Oncology

## 2020-10-20 DIAGNOSIS — Z7189 Other specified counseling: Secondary | ICD-10-CM

## 2020-10-20 DIAGNOSIS — C541 Malignant neoplasm of endometrium: Secondary | ICD-10-CM

## 2020-10-20 NOTE — Progress Notes (Signed)
Gynecologic Oncology Telehealth Follow-up Note  I connected with Stacey Wise on 10/20/20 at  3:30 PM EST by telephone and verified that I am speaking with the correct person using two identifiers.  I discussed the limitations, risks, security and privacy concerns of performing an evaluation and management service by telemedicine and the availability of in-person appointments. I also discussed with the patient that there may be a patient responsible charge related to this service. The patient expressed understanding and agreed to proceed.  Other persons participating in the visit and their role in the encounter: none.  Patient's location: home Provider's location: [place] 65 y.o. female  Chief Complaint  Patient presents with  . endometrial cancer    Assessment and Plan : Stage IA grade 1 endometrioid endometrial adenocarcinoma (MMR pending) in the setting of morbid obesity and poorly controlled diabetes. S/p robotic staging with hysterectomy >250gm, BSO and minilap on 10/13/20.   Pathology revealed low risk factors for recurrence, therefore no adjuvant therapy is recommended according to NCCN guidelines.  I discussed risk for recurrence and typical symptoms encouraged her to notify us of these should they develop between visits.  I recommend she have follow-up every 6 months for 5 years in accordance with NCCN guidelines. Those visits should include symptom assessment, physical exam and pelvic examination. Pap smears are not indicated or recommended in the routine surveillance of endometrial cancer.  HPI: 65 year old Afro-American female seen in consultation request of Dr. Vanessa Kick regarding management of a newly diagnosed endometrial cancer. The patient's had a long history of irregular bleeding which is gradually become heavier. Patient underwent hysteroscopy and endometrial biopsy showing a grade 1 endometrial adenocarcinoma. Subsequently the  patient's had minimal bleeding. Ultrasound showing the patient has fibroids. Management options were considered at the time of diagnosis and we decided to treat using a Mirena IUD which was placed in November 2017.  Following initial consultation the patient had a Mirena IUD placed. She's had no difficulty with the IUD. She denies any pelvic pain pressure or any vaginal bleeding.  The patient underwent D&C on 01/19/17. Mirena IUD was kept in situ during the procedure.  The pathology confirmed persistent grade1-2 endometrial cancer.  [place] experience no weight loss in 4 years.  TVUS in September, 2019 showed a bulky 13x6x6cm uterus with fibroids and a 74m endometrium. Unable to see IUD due to acoustic shadowing.   After several years of no vaginal bleeding, she developed an episode of vaginal bleeding in August, 2021. A TVUS was ordered and performed on May 18, 2020.  This revealed a uterus measuring 8.2 x 5 x 7.2 cm with intramural fibroids the largest of which measuring 5 cm in the lower uterine segment.  The endometrium could not be visualized due to fibroids.  The IUD was not definitively seen.  The ovaries were not visualized.  Interval history:  Due to her onset of bleeding symptoms and the "age" of her IUD she was recommended to undergo either D&C replacement of IUD or attempted robotic hysterectomy.  On 10/13/20 she underwent robotic assisted total hysterectomy for a uterus >250gm, BSO and minilaparotomy for specimen delivery. Intraoperative findings were significant for 16cm bulky fibroid uterus, normal appearing tubes and ovaries, small umbilical hernia without incarceration of visceral structures, extreme intraperitoneal adiposity and difficulty visualizing pelvic structures. Surgery was challenging due to body habitus and poor visualization, but without complications.  Final pathology revealed a FIGO grade 1 endometrioid endometrial adenocarcinoma with 2 of  21m myometrial  invasion (inner half), no LVSI, no involvement of the cervix or ovaries. She was determined to have low risk cancer and no adjuvant therapy was recommended in accordance with NCCN guidelines.  Since surgery she has done well.  Review of Systems:10 point review of systems is negative except as noted in interval history.   Vitals: Last menstrual period 06/23/2016.  Deferred due to phone visit     Allergies  Allergen Reactions  . Empagliflozin-Metformin Hcl     Other reaction(s): headaches and urine frequency    Past Medical History:  Diagnosis Date  . Arthritis   . [place] ([facility]    intrauterine cancer   . Complication of anesthesia    hard to wake up from parathyroidectomy  . Diabetes [place] ([facility] [place]    per patient " i am a type 2 diabetic"  . DIABETIC PERIPHERAL NEUROPATHY 02/28/2007  . DYSLIPIDEMIA 02/28/2007  . Family history of anesthesia complication    sister hard to wake up  . Heel spur, right   . Hepatitis    "states she has only been vaccinated but never had"  . HYPERTENSION 02/28/2007  . Morbid obesity ([facility] 02/28/2007  . Pneumonia    x2  . SLEEP APNEA 02/28/2007   does not use a cpap-could not use  . Trigger finger, left ring finger   . Uterine fibroid     Past Surgical History:  Procedure Laterality Date  . BREAST LUMPECTOMY WITH NEEDLE LOCALIZATION Left 01/01/2013   Procedure: LEFT BREAST LUMPECTOMY WITH NEEDLE LOCALIZATION;  Surgeon: TMarcello MooresA. Cornett, MD;  [facility]  Service: General;  Laterality: Left;  . CERVICAL BIOPSY  W/ LOOP ELECTRODE EXCISION    . CERVICAL POLYPECTOMY N/A 06/27/2016   Procedure: CERVICAL POLYPECTOMY;  Surgeon: KVanessa Kick MD;  [facility];  Service: Gynecology;  Laterality: N/A;  . DILATION AND CURETTAGE OF UTERUS  2009  . DILATION AND CURETTAGE OF UTERUS N/A 06/27/2016   Procedure: DILATATION AND CURETTAGE;  Surgeon: KVanessa Kick MD;  [facility];  Service: Gynecology;   Laterality: N/A;  . DILATION AND CURETTAGE OF UTERUS N/A 01/19/2017   Procedure: DILATATION AND CURETTAGE;  Surgeon: REveritt Amber MD;  [facility];  Service: Gynecology;  Laterality: N/A;  . ELECTROCARDIOGRAM  11/19/2004  . HYSTEROSCOPY N/A 06/27/2016   Procedure: HYSTEROSCOPY;  Surgeon: KVanessa Kick MD;  [facility];  Service: Gynecology;  Laterality: N/A;  . PARATHYROIDECTOMY Left 06/30/2015   Procedure: LEFT PARATHYROIDECTOMY;  Surgeon: TArmandina Gemma MD;  [facility];  Service: General;  Laterality: Left;  . ROBOTIC ASSISTED TOTAL HYSTERECTOMY WITH BILATERAL SALPINGO OOPHERECTOMY Bilateral 10/13/2020   Procedure: XI ROBOTIC ASSISTED TOTAL HYSTERECTOMY WITH BILATERAL SALPINGO OOPHORECTOMY GREATER THAN 250GMS, MINI LAPAROTOMY;  Surgeon: REveritt Amber MD;  [facility];  Service: Gynecology;  Laterality: Bilateral;  . TUBAL LIGATION  1996    Current Outpatient Medications  Medication Sig Dispense Refill  . aspirin 81 MG tablet Take 81 mg by mouth daily.    .Marland Kitchenatorvastatin (LIPITOR) 20 MG tablet Take 20 mg by mouth at bedtime.     .Marland Kitchendiltiazem (TIAZAC) 300 MG 24 hr capsule TAKE ONE CAPSULE EVERY DAY (Patient taking differently: Take 300 mg by mouth daily.) 90 capsule 3  . hydrALAZINE (APRESOLINE) 50 MG tablet Take 50 mg by mouth 3 (three) times daily.    .Marland Kitchenibuprofen (ADVIL) 600 MG tablet Take 1 tablet (600 mg total) by mouth every 6 (six) hours  as needed for moderate pain. For AFTER surgery 30 tablet 0  . insulin aspart protamine- aspart (NOVOLOG MIX 70/30) (70-30) 100 UNIT/ML injection Inject into the skin. Flex pen per pt Has not started will start once completes stock of Humalog 75.25 .  Dose will be 40 units in the am and 2u units in the pm    . levonorgestrel (MIRENA) 20 MCG/24HR IUD 1 Intra Uterine Device (1 each total) by Intrauterine route once. 1 each 0  . losartan (COZAAR) 100 MG tablet Take 100 mg by mouth daily.    . metFORMIN (GLUCOPHAGE-XR) 500 MG 24 hr tablet Take 500  mg by mouth 3 (three) times daily.  4  . senna-docusate (SENOKOT-S) 8.6-50 MG tablet Take 2 tablets by mouth at bedtime. For AFTER surgery, do not take if having diarrhea 30 tablet 0  . torsemide (DEMADEX) 20 MG tablet Take 20 mg by mouth daily in the afternoon. Pt takes in the am    . traMADol (ULTRAM) 50 MG tablet Take 1 tablet (50 mg total) by mouth every 6 (six) hours as needed for severe pain. For AFTER surgery only, do not take and drive 10 tablet 0  . TRULICITY 7.12 RF/7.5OI SOPN Inject 0.75 mg into the skin once a week.     No current facility-administered medications for this visit.    Social History   Socioeconomic History  . Marital status: Single    Spouse name: Not on file  . Number of children: Not on file  . Years of education: Not on file  . Highest education level: Not on file  Occupational History  . Occupation: Sales executive for [employer]: [employer]  Tobacco Use  . Smoking status: Never Smoker  . Smokeless tobacco: Never Used  Vaping Use  . Vaping Use: Never used  Substance and Sexual Activity  . Alcohol use: No  . Drug use: No  . Sexual activity: Not on file  Other Topics Concern  . Not on file  Social History Narrative  . Not on file   Social Determinants of Health   Financial Resource Strain: Not on file  Food Insecurity: Not on file  Transportation Needs: Not on file  Physical Activity: Not on file  Stress: Not on file  Social Connections: Not on file  Intimate Partner Violence: Not on file    Family History  Problem Relation Age of Onset  . Diabetes Mother   . Arthritis Mother   . Hypertension Mother   . Arthritis Father   . Cancer Father        Prostate  . Dementia Father   . Alzheimer's disease Father   . Cancer Sister        Lymphoma  . Arthritis Sister   . Arthritis Brother   . Cancer Sister        Peritoneal  . Arthritis Sister   . Arthritis Sister   . Arthritis Sister   . Arthritis  Brother   . Diabetes Other        Sibling  . Diabetes Other        Sibling    I discussed the assessment and treatment plan with the patient. The patient was provided with an opportunity to ask questions and all were answered. The patient agreed with the plan and demonstrated an understanding of the instructions.   The patient was advised to call back or see an in-person evaluation if the symptoms worsen or if the  condition fails to improve as anticipated.   I provided 20 minutes of non face-to-face telephone visit time during this encounter, and > 50% was spent counseling as documented under my assessment & plan.   Thereasa Solo, MD 10/20/2020, 12:04 PM

## 2020-10-20 NOTE — Progress Notes (Signed)
Mailed healthy diet information to patient including My Plate Plan and plant-based diet.  This is at patient's request.

## 2020-10-21 LAB — SURGICAL PATHOLOGY

## 2020-10-22 ENCOUNTER — Telehealth: Payer: Self-pay

## 2020-10-22 ENCOUNTER — Telehealth: Payer: Self-pay | Admitting: *Deleted

## 2020-10-22 NOTE — Telephone Encounter (Signed)
Patient called "I wanted to know what Dr Denman George or Lenna Sciara APP would recommend to put on my skin for itching." Explained that someone would call her back later today

## 2020-10-22 NOTE — Telephone Encounter (Addendum)
Returned call to patient regarding rash.  Patient describes rash as "welty patches" covering most of abdomen worse around incisions.  Stacey Wise stated she has moderate to severe itching on entire abdomen.  She has been using an anti itch cream on itchy areas except for surgical sites.  She stated the cream is only slightly helpful.  Stacey Wise denied any new detergents, soaps or medications.  She is unable to upload photos. Suggested patient take some oral antihistamine such as benadryl, zyrtec or claritin.  Patient stated she only has benadryl at home and is worried to take 2/2 drowsiness.  Please advise. Per [name], NP- patient can take oral zyrtec or claritin in the daytime and benadryl at hs for itching.  Patient can also use anti itch or hydrocortisone cream on abdomen except for surgical sites.  Patient can use cold packs for symptom relief.  Allergic precautions reviewed with patient.  Patient will seek medical attention if rash worsens or becomes unbearable.  Patient verbalized understanding of all information provided.  Patient will call tomorrow if rash has not improved.

## 2020-10-23 ENCOUNTER — Telehealth: Payer: Self-pay

## 2020-10-23 NOTE — Telephone Encounter (Signed)
Stacey Wise states that the rash is still red on abdomen.  The welts are gone.  The itching is is just about gone. She is using Zyrtec and the cortisone cream during the day and the Benadryl at hs.  Stacey Tessler is to call if the rash or symptoms get worse. Pt verbalized understanding.

## 2020-10-26 ENCOUNTER — Telehealth: Payer: Self-pay | Admitting: Oncology

## 2020-10-26 DIAGNOSIS — C541 Malignant neoplasm of endometrium: Secondary | ICD-10-CM

## 2020-10-26 NOTE — Telephone Encounter (Signed)
Called Stacey Wise and advised her that she qualifies for genetic counseling because of her family history (sister with peritoneal cancer).  She verbalized understanding and would like to schedule for 11/03/20.  Appointment scheduled for 11/03/20 at 1:00.

## 2020-11-02 ENCOUNTER — Encounter: Payer: Self-pay | Admitting: Gynecologic Oncology

## 2020-11-03 ENCOUNTER — Other Ambulatory Visit: Payer: Self-pay

## 2020-11-03 ENCOUNTER — Inpatient Hospital Stay (HOSPITAL_BASED_OUTPATIENT_CLINIC_OR_DEPARTMENT_OTHER): Payer: BC Managed Care – PPO | Admitting: Genetic Counselor

## 2020-11-03 ENCOUNTER — Inpatient Hospital Stay: Payer: BC Managed Care – PPO

## 2020-11-03 ENCOUNTER — Other Ambulatory Visit: Payer: Self-pay | Admitting: Genetic Counselor

## 2020-11-03 ENCOUNTER — Inpatient Hospital Stay: Payer: BC Managed Care – PPO | Attending: Gynecologic Oncology | Admitting: Gynecologic Oncology

## 2020-11-03 ENCOUNTER — Encounter: Payer: Self-pay | Admitting: Gynecologic Oncology

## 2020-11-03 VITALS — BP 136/44 | HR 101 | Temp 97.0°F | Resp 18 | Ht 64.0 in | Wt 324.2 lb

## 2020-11-03 DIAGNOSIS — Z90722 Acquired absence of ovaries, bilateral: Secondary | ICD-10-CM | POA: Insufficient documentation

## 2020-11-03 DIAGNOSIS — C541 Malignant neoplasm of endometrium: Secondary | ICD-10-CM | POA: Diagnosis not present

## 2020-11-03 DIAGNOSIS — E1165 Type 2 diabetes mellitus with hyperglycemia: Secondary | ICD-10-CM | POA: Diagnosis not present

## 2020-11-03 DIAGNOSIS — Z8042 Family history of malignant neoplasm of prostate: Secondary | ICD-10-CM

## 2020-11-03 DIAGNOSIS — Z7984 Long term (current) use of oral hypoglycemic drugs: Secondary | ICD-10-CM | POA: Insufficient documentation

## 2020-11-03 DIAGNOSIS — I10 Essential (primary) hypertension: Secondary | ICD-10-CM | POA: Diagnosis not present

## 2020-11-03 DIAGNOSIS — Z90710 Acquired absence of both cervix and uterus: Secondary | ICD-10-CM | POA: Insufficient documentation

## 2020-11-03 DIAGNOSIS — Z79899 Other long term (current) drug therapy: Secondary | ICD-10-CM | POA: Insufficient documentation

## 2020-11-03 DIAGNOSIS — Z808 Family history of malignant neoplasm of other organs or systems: Secondary | ICD-10-CM

## 2020-11-03 DIAGNOSIS — Z7189 Other specified counseling: Secondary | ICD-10-CM

## 2020-11-03 NOTE — Progress Notes (Signed)
Gynecologic Oncology Follow-up Note  Stacey Wise 65 y.o. female  Chief Complaint  Patient presents with  . Endometrial cancer [facility])    Post Operative Visit    Assessment and Plan : Stage IA grade 1 endometrioid endometrial adenocarcinoma (MMR normal/MSI stable) in the setting of morbid obesity and poorly controlled diabetes. S/p robotic staging with hysterectomy >250gm, BSO and minilap on 10/13/20.   Pathology revealed low risk factors for recurrence, therefore no adjuvant therapy is recommended according to NCCN guidelines.  I discussed risk for recurrence and typical symptoms encouraged her to notify Stacey Wise of these should they develop between visits.  I recommend she have follow-up every 6 months for 5 years in accordance with NCCN guidelines. Those visits should include symptom assessment, physical exam and pelvic examination. Pap smears are not indicated or recommended in the routine surveillance of endometrial cancer.  HPI: 65 year old Afro-American female seen in consultation request of Dr. Vanessa Kick regarding management of a newly diagnosed endometrial cancer. The patient's had a long history of irregular bleeding which is gradually become heavier. Patient underwent hysteroscopy and endometrial biopsy showing a grade 1 endometrial adenocarcinoma. Subsequently the patient's had minimal bleeding. Ultrasound showing the patient has fibroids. Management options were considered at the time of diagnosis and we decided to treat using a Mirena IUD which was placed in November 2017.  Following initial consultation the patient had a Mirena IUD placed. She's had no difficulty with the IUD. She denies any pelvic pain pressure or any vaginal bleeding.  The patient underwent D&C on 01/19/17. Mirena IUD was kept in situ during the procedure.  The pathology confirmed persistent grade1-2 endometrial cancer.  [name] experience no weight loss in 4 years.  TVUS in September, 2019 showed a bulky  13x6x6cm uterus with fibroids and a 12m endometrium. Unable to see IUD due to acoustic shadowing.   After several years of no vaginal bleeding, she developed an episode of vaginal bleeding in August, 2021. A TVUS was ordered and performed on May 18, 2020.  This revealed a uterus measuring 8.2 x 5 x 7.2 cm with intramural fibroids the largest of which measuring 5 cm in the lower uterine segment.  The endometrium could not be visualized due to fibroids.  The IUD was not definitively seen.  The ovaries were not visualized.  Interval history:  Due to her onset of bleeding symptoms and the "age" of her IUD she was recommended to undergo either D&C replacement of IUD or attempted robotic hysterectomy.  On 10/13/20 she underwent robotic assisted total hysterectomy for a uterus >250gm, BSO and minilaparotomy for specimen delivery. Intraoperative findings were significant for 16cm bulky fibroid uterus, normal appearing tubes and ovaries, small umbilical hernia without incarceration of visceral structures, extreme intraperitoneal adiposity and difficulty visualizing pelvic structures. Surgery was challenging due to body habitus and poor visualization, but without complications.  Final pathology revealed a FIGO grade 1 endometrioid endometrial adenocarcinoma with 2 of 768mmyometrial invasion (inner half), no LVSI, no involvement of the cervix or ovaries. She was determined to have low risk cancer and no adjuvant therapy was recommended in accordance with NCCN guidelines. MMR was normal and MSI stable.   Since surgery she has done well. She has no complaints  Review of Systems:10 point review of systems is negative except as noted in interval history.   Vitals: Blood pressure (!) 136/44, pulse (!) 101, temperature (!) 97 F (36.1 C), temperature source Tympanic, resp. rate 18, height '5\' 4"'  (1.626 m), weight (![name]  324 lb 3.2 oz (147.1 kg), last menstrual period 06/23/2016, SpO2 100 %. Physical Exam: WD  in NAD Neck  Supple NROM, without any enlargements.  Lymph Node Survey No cervical supraclavicular or inguinal adenopathy Cardiovascular  Well perfused peripheries Lungs  No increased WOB Skin  No rash/lesions/breakdown  Psychiatry  Alert and oriented to person, place, and time  Abdomen  Normoactive bowel sounds, abdomen soft, non-tender and obese without evidence of hernia. Incisions well healed Back No CVA tenderness Genito Urinary  Vulva/vagina: vaginal cuff in tact, healing normally.  Rectal  deferred Extremities  No bilateral cyanosis, clubbing or edema.       Allergies  Allergen Reactions  . Empagliflozin-Metformin Hcl     Other reaction(s): headaches and urine frequency    Past Medical History:  Diagnosis Date  . Arthritis   . Cancer ([place])    intrauterine cancer   . Complication of anesthesia    hard to wake up from parathyroidectomy  . Diabetes mellitus without complication ([place])    per patient " i am a type 2 diabetic"  . DIABETIC PERIPHERAL NEUROPATHY 02/28/2007  . DYSLIPIDEMIA 02/28/2007  . Family history of anesthesia complication    sister hard to wake up  . Heel spur, right   . Hepatitis    "states she has only been vaccinated but never had"  . HYPERTENSION 02/28/2007  . Morbid obesity ([place]) 02/28/2007  . Pneumonia    x2  . SLEEP APNEA 02/28/2007   does not use a cpap-could not use  . Trigger finger, left ring finger   . Uterine fibroid     Past Surgical History:  Procedure Laterality Date  . BREAST LUMPECTOMY WITH NEEDLE LOCALIZATION Left 01/01/2013   Procedure: LEFT BREAST LUMPECTOMY WITH NEEDLE LOCALIZATION;  Surgeon: Marcello Moores A. Cornett, MD;  Location: [place];  Service: General;  Laterality: Left;  . CERVICAL BIOPSY  W/ LOOP ELECTRODE EXCISION    . CERVICAL POLYPECTOMY N/A 06/27/2016   Procedure: CERVICAL POLYPECTOMY;  Surgeon: Vanessa Kick, MD;  Location: [facility];  Service: Gynecology;  Laterality: N/A;  . DILATION AND  CURETTAGE OF UTERUS  2009  . DILATION AND CURETTAGE OF UTERUS N/A 06/27/2016   Procedure: DILATATION AND CURETTAGE;  Surgeon: Vanessa Kick, MD;  Location: [facility];  Service: Gynecology;  Laterality: N/A;  . DILATION AND CURETTAGE OF UTERUS N/A 01/19/2017   Procedure: DILATATION AND CURETTAGE;  Surgeon: Everitt Amber, MD;  [facility];  Service: Gynecology;  Laterality: N/A;  . ELECTROCARDIOGRAM  11/19/2004  . HYSTEROSCOPY N/A 06/27/2016   Procedure: HYSTEROSCOPY;  Surgeon: Vanessa Kick, MD;  Location: [facility];  Service: Gynecology;  Laterality: N/A;  . PARATHYROIDECTOMY Left 06/30/2015   Procedure: LEFT PARATHYROIDECTOMY;  Surgeon: Armandina Gemma, MD;  [facility];  Service: General;  Laterality: Left;  . ROBOTIC ASSISTED TOTAL HYSTERECTOMY WITH BILATERAL SALPINGO OOPHERECTOMY Bilateral 10/13/2020   Procedure: XI ROBOTIC ASSISTED TOTAL HYSTERECTOMY WITH BILATERAL SALPINGO OOPHORECTOMY GREATER THAN 250GMS, MINI LAPAROTOMY;  Surgeon: Everitt Amber, MD;  [facility];  Service: Gynecology;  Laterality: Bilateral;  . TUBAL LIGATION  1996    Current Outpatient Medications  Medication Sig Dispense Refill  . aspirin 81 MG tablet Take 81 mg by mouth daily.    [name] atorvastatin (LIPITOR) 20 MG tablet Take 20 mg by mouth at bedtime.     [name] diltiazem (TIAZAC) 300 MG 24 hr capsule TAKE ONE CAPSULE EVERY DAY (Patient taking differently: Take 300 mg by mouth daily.) 90 capsule 3  .  hydrALAZINE (APRESOLINE) 50 MG tablet Take 50 mg by mouth 3 (three) times daily.    [name] ibuprofen (ADVIL) 600 MG tablet Take 1 tablet (600 mg total) by mouth every 6 (six) hours as needed for moderate pain. For AFTER surgery 30 tablet 0  . insulin aspart protamine- aspart (NOVOLOG MIX 70/30) (70-30) 100 UNIT/ML injection Inject into the skin. Flex pen per pt Has not started will start once completes stock of Humalog 75.25 .  Dose will be 40 units in the am and 20 u units in the pm    . losartan (COZAAR) 100 MG tablet Take 100 mg by  mouth daily.    . metFORMIN (GLUCOPHAGE-XR) 500 MG 24 hr tablet Take 500 mg by mouth 3 (three) times daily.  4  . torsemide (DEMADEX) 20 MG tablet Take 20 mg by mouth daily in the afternoon. Pt takes in the am    . TRULICITY 9.32 IZ/1.2WP SOPN Inject 0.75 mg into the skin once a week.     No current facility-administered medications for this visit.    Social History   Socioeconomic History  . Marital status: Single    Spouse name: Not on file  . Number of children: Not on file  . Years of education: Not on file  . Highest education level: Not on file  Occupational History  . Occupation: Sales executive for [employer]: [place]  Tobacco Use  . Smoking status: Never Smoker  . Smokeless tobacco: Never Used  Vaping Use  . Vaping Use: Never used  Substance and Sexual Activity  . Alcohol use: No  . Drug use: No  . Sexual activity: Not on file  Other Topics Concern  . Not on file  Social History Narrative  . Not on file   Social Determinants of Health   Financial Resource Strain: Not on file  Food Insecurity: Not on file  Transportation Needs: Not on file  Physical Activity: Not on file  Stress: Not on file  Social Connections: Not on file  Intimate Partner Violence: Not on file    Family History  Problem Relation Age of Onset  . Diabetes Mother   . Arthritis Mother   . Hypertension Mother   . Arthritis Father   . Cancer Father        Prostate  . Dementia Father   . Alzheimer's disease Father   . Cancer Sister        Lymphoma  . Arthritis Sister   . Arthritis Brother   . Cancer Sister        Peritoneal  . Arthritis Sister   . Arthritis Sister   . Arthritis Sister   . Arthritis Brother   . Diabetes Other        Sibling  . Diabetes Other        Sibling     30 minutes of direct face to face counseling time was spent with the patient. This included discussion about prognosis, therapy recommendations and postoperative side  effects and are beyond the scope of routine postoperative care.   Thereasa Solo, MD 11/03/2020, 2:32 PM

## 2020-11-03 NOTE — Patient Instructions (Signed)
Please notify Dr Denman George at phone number [phone] if you notice vaginal bleeding, new pelvic or abdominal pains, bloating, feeling full easy, or a change in bladder or bowel function.   Please contact Dr Serita Grit office (at [phone]) in June to request an appointment with her for September, 2022.

## 2020-11-04 ENCOUNTER — Encounter: Payer: Self-pay | Admitting: Genetic Counselor

## 2020-11-04 DIAGNOSIS — Z8042 Family history of malignant neoplasm of prostate: Secondary | ICD-10-CM | POA: Insufficient documentation

## 2020-11-04 DIAGNOSIS — Z808 Family history of malignant neoplasm of other organs or systems: Secondary | ICD-10-CM | POA: Insufficient documentation

## 2020-11-04 DIAGNOSIS — Z8041 Family history of malignant neoplasm of ovary: Secondary | ICD-10-CM | POA: Diagnosis not present

## 2020-11-04 DIAGNOSIS — C541 Malignant neoplasm of endometrium: Secondary | ICD-10-CM | POA: Diagnosis not present

## 2020-11-04 NOTE — Progress Notes (Signed)
REFERRING PROVIDER: Dorothyann Gibbs, NP [place],  [place] [address]  PRIMARY PROVIDER:  Vernie Shanks, MD  PRIMARY REASON FOR VISIT:  1. Endometrial cancer ([place])   2. Family history of peritoneal cancer   3. Family history of prostate cancer      HISTORY OF PRESENT ILLNESS:   Stacey Wise, a 65 y.o. female, was seen for a [facility] cancer genetics consultation at the request of Dr. Elinor Parkinson due to a personal and family history of cancer.  Stacey Wise presents to clinic today to discuss the possibility of a hereditary predisposition to cancer, genetic testing, and to further clarify her future cancer risks, as well as potential cancer risks for family members.   In 2017, at the age of 25, Stacey Wise was diagnosed with endometrioid endometrial cancer. The tumor had normal MMR protein expression and was microsatellite stable. She underwent a hysterectomy and BSO on 10/13/2020.  RISK FACTORS:  Menarche was at age 80.  No live births.  OCP use for approximately 2 years.  Ovaries intact: no.  Hysterectomy: yes.  Menopausal status: postmenopausal.  HRT use: 0 years. Colonoscopy: no; not examined. Mammogram within the last year: no. Any excessive radiation exposure in the past: no.   Past Medical History:  Diagnosis Date  . Arthritis   . Cancer ([place])    intrauterine cancer   . Complication of anesthesia    hard to wake up from parathyroidectomy  . Diabetes mellitus without complication ([place])    per patient " i am a type 2 diabetic"  . DIABETIC PERIPHERAL NEUROPATHY 02/28/2007  . DYSLIPIDEMIA 02/28/2007  . Family history of anesthesia complication    sister hard to wake up  . Family history of peritoneal cancer   . Family history of prostate cancer   . Heel spur, right   . Hepatitis    "states she has only been vaccinated but never had"  . HYPERTENSION 02/28/2007  . Morbid obesity ([place]) 02/28/2007  . Pneumonia    x2  . SLEEP APNEA 02/28/2007   does not use a  cpap-could not use  . Trigger finger, left ring finger   . Uterine fibroid     Past Surgical History:  Procedure Laterality Date  . BREAST LUMPECTOMY WITH NEEDLE LOCALIZATION Left 01/01/2013   Procedure: LEFT BREAST LUMPECTOMY WITH NEEDLE LOCALIZATION;  Surgeon: Marcello Moores A. Cornett, MD;  Location: [facility];  Service: General;  Laterality: Left;  . CERVICAL BIOPSY  W/ LOOP ELECTRODE EXCISION    . CERVICAL POLYPECTOMY N/A 06/27/2016   Procedure: CERVICAL POLYPECTOMY;  Surgeon: Vanessa Kick, MD;  Location: [facility];  Service: Gynecology;  Laterality: N/A;  . DILATION AND CURETTAGE OF UTERUS  2009  . DILATION AND CURETTAGE OF UTERUS N/A 06/27/2016   Procedure: DILATATION AND CURETTAGE;  Surgeon: Vanessa Kick, MD;  Location: [facility];  Service: Gynecology;  Laterality: N/A;  . DILATION AND CURETTAGE OF UTERUS N/A 01/19/2017   Procedure: DILATATION AND CURETTAGE;  Surgeon: Everitt Amber, MD;  Location: [facility];  Service: Gynecology;  Laterality: N/A;  . ELECTROCARDIOGRAM  11/19/2004  . HYSTEROSCOPY N/A 06/27/2016   Procedure: HYSTEROSCOPY;  Surgeon: Vanessa Kick, MD;  Location: [facility];  Service: Gynecology;  Laterality: N/A;  . PARATHYROIDECTOMY Left 06/30/2015   Procedure: LEFT PARATHYROIDECTOMY;  Surgeon: Armandina Gemma, MD;  Location: [facility];  Service: General;  Laterality: Left;  . ROBOTIC ASSISTED TOTAL HYSTERECTOMY WITH BILATERAL SALPINGO OOPHERECTOMY Bilateral 10/13/2020   Procedure: XI ROBOTIC  ASSISTED TOTAL HYSTERECTOMY WITH BILATERAL SALPINGO OOPHORECTOMY GREATER THAN 250GMS, MINI LAPAROTOMY;  Surgeon: Everitt Amber, MD;  Location: [facility];  Service: Gynecology;  Laterality: Bilateral;  . TUBAL LIGATION  1996    Social History   Socioeconomic History  . Marital status: Single    Spouse name: Not on file  . Number of children: Not on file  . Years of education: Not on file  . Highest education level: Not on file  Occupational History  . Occupation: Sales executive for [employer]: [place]  Tobacco Use  . Smoking status: Never Smoker  . Smokeless tobacco: Never Used  Vaping Use  . Vaping Use: Never used  Substance and Sexual Activity  . Alcohol use: No  . Drug use: No  . Sexual activity: Not on file  Other Topics Concern  . Not on file  Social History Narrative  . Not on file   Social Determinants of Health   Financial Resource Strain: Not on file  Food Insecurity: Not on file  Transportation Needs: Not on file  Physical Activity: Not on file  Stress: Not on file  Social Connections: Not on file     FAMILY HISTORY:  We obtained a detailed, 4-generation family history.  Significant diagnoses are listed below: Family History  Problem Relation Age of Onset  . Diabetes Mother   . Arthritis Mother   . Hypertension Mother   . Arthritis Father   . Dementia Father   . Alzheimer's disease Father   . Prostate cancer Father        Prostate  . Cancer Sister        Lymphoma  . Arthritis Sister   . Arthritis Brother   . Prostate cancer Brother   . Cancer Sister 12       Peritoneal  . Arthritis Sister   . Arthritis Sister   . Arthritis Sister   . Arthritis Brother   . Diabetes Other        Sibling  . Diabetes Other        Sibling   Stacey Wise does not have children. She has two brothers and four sisters. One sister died from peritoneal cancer at age 15 (diagnosed at age 8). One brother has prostate cancer (unknown age of diagnosis).  Stacey Wise mother died at age 58 without cancer. There was one maternal aunt and one maternal uncle. There is no known cancer among maternal aunts/uncles or maternal cousins. Stacey Wise's maternal grandmother died in her 74s without cancer. Her maternal grandfather died in his 65s without cancer.  Stacey Wise father died at age 61 and had prostate cancer (unknown age of diagnosis). There were three paternal aunts and one paternal uncle. There is no known cancer among  paternal aunts/uncles or paternal cousins. Stacey Wise paternal grandparents died older than 20 without cancer.   Stacey Wise is unaware of previous family history of genetic testing for hereditary cancer risks. Patient's ancestors are of Native American and Black descent. There is no reported Ashkenazi Jewish ancestry. There is no known consanguinity.  GENETIC COUNSELING ASSESSMENT: Stacey Wise is a 65 y.o. female with a personal history of endometrial cancer and a family history of peritoneal cancer and prostate cancer, which is somewhat suggestive of a hereditary cancer syndrome and predisposition to cancer. We, therefore, discussed and recommended the following at today's visit.   DISCUSSION: We discussed that approximately 5-10% of cancer is hereditary, with most cases of hereditary prostate  and/or peritoneal cancer associated with the BRCA1 and BRCA2 genes. There are other genes that can be associated with hereditary prostate, peritoneal, and/or endometrial cancer syndromes. These include the Lynch syndrome genes, ATM, CHEK2, PALB2, etc. We discussed that testing is beneficial for several reasons, including knowing about other cancer risks, identifying potential screening and risk-reduction options that may be appropriate, and to understand if other family members could be at risk for cancer and allow them to undergo genetic testing.  We reviewed the characteristics, features and inheritance patterns of hereditary cancer syndromes. We also discussed genetic testing, including the appropriate family members to test, the process of testing, insurance coverage and turn-around-time for results. We discussed the implications of a negative, positive and/or variant of uncertain significant result. We recommended Stacey Wise pursue genetic testing for the [facility] CustomNext-Cancer + RNAinsight gene panel.   The CustomNext-Cancer+RNAinsight panel offered by [facility] includes sequencing and  rearrangement analysis for the following 50 genes:  APC, ATM, AXIN2, BARD1, BMPR1A, BRCA1, BRCA2, BRIP1, CASR, CDC73, CDH1, CDK4, CDKN1B, CDKN2A, CHEK2, DICER1, EPCAM, GREM1, HOXB13, MEN1, MLH1, MSH2, MSH3, MSH6, MUTYH, NBN, NF1, NF2, NTHL1, PALB2, PMS2, POLD1, POLE, PTEN, RAD51C, RAD51D, RECQL, RET, SDHA, SDHAF2, SDHB, SDHC, SDHD, SMAD4, SMARCA4, STK11, TP53, TSC1, TSC2, and VHL.  RNA data is routinely analyzed for use in variant interpretation for all genes.  Based on Stacey Wise personal and family history of cancer, she meets medical criteria for genetic testing. Despite that she meets criteria, there may still be an out of pocket cost. Stacey Wise expressed concern regarding the potential cost of testing. Therefore, we discussed the option to request a preverification from the genetic testing laboratory to better estimate the out of pocket cost, prior to ordering testing. Stacey Wise is interested in this option.  PLAN: To better estimate her out of pocket cost for genetic testing, we will submit a preverification request to [facility] [employer] for the CustomNext-Cancer + RNAinsight panel. We will then call Stacey Wise with the estimated OOP cost, at which point she will decide whether she is comfortable proceeding with genetic testing at that time. In the meantime, we recommend Stacey Wise continue to follow the cancer screening guidelines given by her primary healthcare provider.  Stacey Wise's questions were answered to her satisfaction today. Our contact information was provided should additional questions or concerns arise. Thank you for the referral and allowing [name] to share in the care of your patient.   Clint Guy, [place], [facility] Licensed, Certified Dispensing [email or web address] Phone: [phone]  The patient was seen for a total of 40 minutes in face-to-face genetic counseling.  This patient was discussed with Drs. Magrinat, Lindi Adie and/or Burr Medico who agrees with the above.     _______________________________________________________________________ For Office Staff:  Number of people involved in session: 2 Was an Intern/ student involved with case: yes - observation only

## 2020-11-06 ENCOUNTER — Telehealth: Payer: Self-pay | Admitting: *Deleted

## 2020-11-06 NOTE — Telephone Encounter (Signed)
Patient called back and the paperwork is to return to work. Paperwork filled out and signed; fax

## 2020-11-06 NOTE — Telephone Encounter (Signed)
Received short term disability paperwork for the patient. Called and left the patient a message to  call the office back.

## 2020-11-10 ENCOUNTER — Telehealth: Payer: Self-pay | Admitting: *Deleted

## 2020-11-10 ENCOUNTER — Telehealth: Payer: Self-pay | Admitting: Genetic Counselor

## 2020-11-10 DIAGNOSIS — E1165 Type 2 diabetes mellitus with hyperglycemia: Secondary | ICD-10-CM | POA: Diagnosis not present

## 2020-11-10 DIAGNOSIS — Z794 Long term (current) use of insulin: Secondary | ICD-10-CM | POA: Diagnosis not present

## 2020-11-10 DIAGNOSIS — M8580 Other specified disorders of bone density and structure, unspecified site: Secondary | ICD-10-CM | POA: Diagnosis not present

## 2020-11-10 NOTE — Telephone Encounter (Signed)
Patient returned call and stated that she didn't need the forms filled out; she returns to work today

## 2020-11-10 NOTE — Telephone Encounter (Signed)
Received FMLA paperwork for the patient; called the patient and left a message to call the office back. Patient was released back to work on 11/10/2020

## 2020-11-10 NOTE — Telephone Encounter (Signed)
Discussed with Stacey Wise that, if an insurance authorization is approved, the estimated out of pocket cost for testing would be $0. The insurance authorization is currently in progress. Stacey Wise is requesting information about the availability of family members who have had cancer for genetic testing, specifically, her brother with prostate cancer. Stacey Wise states that this brother is not available for testing. We will give her a call when we have the authorization determination.

## 2020-11-13 ENCOUNTER — Telehealth: Payer: Self-pay | Admitting: Genetic Counselor

## 2020-11-13 NOTE — Telephone Encounter (Signed)
Discussed that the authorization for genetic testing has been approved by insurance, and that the [facility] cost for testing will be $0. Stacey Wise feels comfortable proceeding with genetic testing at this time. Set up an appointment to have her blood drawn for testing on Tuesday 3/22 at 9:45 am.

## 2020-11-17 ENCOUNTER — Inpatient Hospital Stay: Payer: BC Managed Care – PPO

## 2020-11-17 ENCOUNTER — Other Ambulatory Visit: Payer: Self-pay

## 2020-11-17 LAB — GENETIC SCREENING ORDER

## 2020-11-23 DIAGNOSIS — F331 Major depressive disorder, recurrent, moderate: Secondary | ICD-10-CM | POA: Diagnosis not present

## 2020-11-23 DIAGNOSIS — I10 Essential (primary) hypertension: Secondary | ICD-10-CM | POA: Diagnosis not present

## 2020-11-23 DIAGNOSIS — E1165 Type 2 diabetes mellitus with hyperglycemia: Secondary | ICD-10-CM | POA: Diagnosis not present

## 2020-11-23 DIAGNOSIS — Z794 Long term (current) use of insulin: Secondary | ICD-10-CM | POA: Diagnosis not present

## 2020-11-30 DIAGNOSIS — Z1379 Encounter for other screening for genetic and chromosomal anomalies: Secondary | ICD-10-CM | POA: Insufficient documentation

## 2020-12-02 ENCOUNTER — Telehealth: Payer: Self-pay | Admitting: Genetic Counselor

## 2020-12-02 ENCOUNTER — Ambulatory Visit: Payer: Self-pay | Admitting: Genetic Counselor

## 2020-12-02 ENCOUNTER — Encounter: Payer: Self-pay | Admitting: Genetic Counselor

## 2020-12-02 DIAGNOSIS — Z1379 Encounter for other screening for genetic and chromosomal anomalies: Secondary | ICD-10-CM

## 2020-12-02 NOTE — Telephone Encounter (Signed)
Revealed negative genetic testing. Discussed that we do not know why she has had uterine cancer or why there is cancer in the family. There could be a genetic mutation in the family that Ms. Constantin did not inherit. There could also be a mutation in a different gene that we are not testing, or our current technology may not be able to detect certain mutations. It will therefore be important for her to stay in contact with genetics to keep up with whether additional testing may be appropriate in the future.

## 2020-12-02 NOTE — Progress Notes (Signed)
HPI:  Stacey Wise was previously seen in the [facility] due to a personal and family history of cancer and concerns regarding a hereditary predisposition to cancer. Please refer to our prior cancer genetics clinic note for more information regarding our discussion, assessment and recommendations, at the time. Stacey Wise recent genetic test results were disclosed to her, as were recommendations warranted by these results. These results and recommendations are discussed in more detail below.  FAMILY HISTORY:  We obtained a detailed, 4-generation family history.  Significant diagnoses are listed below: Family History  Problem Relation Age of Onset  . Diabetes Mother   . Arthritis Mother   . Hypertension Mother   . Arthritis Father   . Dementia Father   . Alzheimer's disease Father   . Prostate cancer Father        Prostate  . Cancer Sister        Lymphoma  . Arthritis Sister   . Arthritis Brother   . Prostate cancer Brother   . Cancer Sister 57       Peritoneal  . Arthritis Sister   . Arthritis Sister   . Arthritis Sister   . Arthritis Brother   . Diabetes Other        Sibling  . Diabetes Other        Sibling  . Prostate cancer Paternal Grandfather     Stacey Wise does not have children. She has two brothers and four sisters. One sister died from peritoneal cancer at age 21 (diagnosed at age 62). One brother has prostate cancer (unknown age of diagnosis).  Stacey Wise mother died at age 39 without cancer. There was one maternal aunt and one maternal uncle. There is no known cancer among maternal aunts/uncles or maternal cousins. Stacey Wise maternal grandmother died in her 40s without cancer. Her maternal grandfather died in his 51s without cancer.  Stacey Wise father died at age 55 and had prostate cancer (unknown age of diagnosis). There were three paternal aunts and one paternal uncle. There is no known cancer among paternal aunts/uncles or  paternal cousins. Stacey Wise paternal grandparents died older than 35 without cancer.   Stacey Wise is unaware of previous family history of genetic testing for hereditary cancer risks. Patient's ancestors are of Native [address] and Black descent. There is no reported Ashkenazi Jewish ancestry. There is no known consanguinity.  GENETIC TEST RESULTS: Genetic testing reported out on 11/30/2020 through the [facility] CustomNext-Cancer + RNAinsight panel. No pathogenic variants were detected.   Genes Analyzed (50 total):  APC, ATM, AXIN2, BARD1, BMPR1A, BRCA1, BRCA2, BRIP1, CDC73, CDH1, CDK4, CDKN1B, CDKN2A, CHEK2, DICER1, MEN1, MLH1, MSH2, MSH3, MSH6, MUTYH, NBN, NF1, NF2, NTHL1, PALB2, PMS2, PTEN, RAD51C, RAD51D, RECQL, RET, SDHA, SDHAF2, SDHB, SDHC, SDHD, SMAD4, SMARCA4, STK11, TP53, TSC1, TSC2 and VHL (sequencing and deletion/duplication); CASR, HOXB13, POLD1 and POLE (sequencing only); EPCAM and GREM1 (deletion/duplication only). RNA data is routinely analyzed for use in variant interpretation for all genes. The test report will be scanned into EPIC and located under the Molecular Pathology section of the Results Review tab.  A portion of the result report is included below for reference.     We discussed with Stacey Wise that because current genetic testing is not perfect, it is possible there may be a gene mutation in one of these genes that current testing cannot detect, but that chance is small.  We also discussed that there could be another gene that has not yet  been discovered, or that we have not yet tested, that is responsible for the cancer diagnoses in the family. It is also possible there is a hereditary cause for the cancer in the family that Stacey Wise did not inherit and therefore was not identified in her testing.  Therefore, it is important to remain in touch with cancer genetics in the future so that we can continue to offer Stacey Wise the most up to date genetic testing.   CANCER  SCREENING RECOMMENDATIONS: Stacey Wise test result is considered negative (normal).  This means that we have not identified a hereditary cause for her personal and family history of cancer at this time. While reassuring, this does not definitively rule out a hereditary predisposition to cancer. It is still possible that there could be genetic mutations that are undetectable by current technology. There could be genetic mutations in genes that have not been tested or identified to increase cancer risk.  Therefore, it is recommended she continue to follow the cancer management and screening guidelines provided by her oncology and primary healthcare provider.   An individual's cancer risk and medical management are not determined by genetic test results alone. Overall cancer risk assessment incorporates additional factors, including personal medical history, family history, and any available genetic information that may result in a personalized plan for cancer prevention and surveillance.  Based on Stacey Wise personal and family history, as well as her genetic test results, a statistical model Midwife) was used to estimate her risk of developing breast cancer. Tyrer-Cuzick estimates her lifetime risk of developing breast cancer to be approximately 6.6%.  This lifetime breast cancer risk is a preliminary estimate based on available information using one of several models endorsed by the [place] (ACS). The ACS recommends consideration of breast MRI screening as an adjunct to mammography for patients at high risk (defined as 20% or greater lifetime risk). A more detailed breast cancer risk assessment can be considered, if clinically indicated.    RECOMMENDATIONS FOR FAMILY MEMBERS:  Individuals in this family might be at some increased risk of developing cancer, over the general population risk, simply due to the family history of cancer.  We recommended women in this family have a  yearly mammogram beginning at age 35, or 55 years younger than the earliest onset of cancer, an annual clinical breast exam, and perform monthly breast self-exams. Women in this family should also have a gynecological exam as recommended by their primary provider. All family members should be referred for colonoscopy starting at age 2.  It is also possible there is a hereditary cause for the cancer in Stacey Wise family that she did not inherit and therefore was not identified in her.  Based on Ms. Surgeon's family history, we recommended her siblings and the children of her sister who had peritoneal cancer have genetic counseling and testing. Ms. Samad will let us know if we can be of any assistance in coordinating genetic counseling and/or testing for these family members.   FOLLOW-UP: Lastly, we discussed with Stacey Wise that cancer genetics is a rapidly advancing field and it is possible that new genetic tests will be appropriate for her and/or her family members in the future. We encouraged her to remain in contact with cancer genetics on an annual basis so we can update her personal and family histories and let her know of advances in cancer genetics that may benefit this family.   Our contact number was provided. Stacey Wise's questions were  answered to her satisfaction, and she knows she is welcome to call us at anytime with additional questions or concerns.   Clint Guy, MS, [facility] Genetic Counselor [email or web address] Phone: [phone]

## 2020-12-07 ENCOUNTER — Encounter: Payer: Self-pay | Admitting: Genetic Counselor

## 2021-02-10 DIAGNOSIS — E1165 Type 2 diabetes mellitus with hyperglycemia: Secondary | ICD-10-CM | POA: Diagnosis not present

## 2021-02-10 DIAGNOSIS — M8580 Other specified disorders of bone density and structure, unspecified site: Secondary | ICD-10-CM | POA: Diagnosis not present

## 2021-02-10 DIAGNOSIS — Z794 Long term (current) use of insulin: Secondary | ICD-10-CM | POA: Diagnosis not present

## 2021-02-10 DIAGNOSIS — E210 Primary hyperparathyroidism: Secondary | ICD-10-CM | POA: Diagnosis not present

## 2021-03-26 ENCOUNTER — Telehealth: Payer: Self-pay | Admitting: Oncology

## 2021-03-26 NOTE — Telephone Encounter (Signed)
Stacey Wise called and requested a follow up with Dr. Denman George in September.  She needs a morning apt since she works third shift.  Scheduled apt for 05/04/21 at 11:45.

## 2021-05-04 ENCOUNTER — Inpatient Hospital Stay: Payer: BC Managed Care – PPO | Attending: Gynecologic Oncology | Admitting: Gynecologic Oncology

## 2021-05-04 ENCOUNTER — Other Ambulatory Visit: Payer: Self-pay

## 2021-05-04 VITALS — BP 165/68 | HR 91 | Temp 98.4°F | Resp 18 | Wt 316.0 lb

## 2021-05-04 DIAGNOSIS — I10 Essential (primary) hypertension: Secondary | ICD-10-CM | POA: Insufficient documentation

## 2021-05-04 DIAGNOSIS — Z79899 Other long term (current) drug therapy: Secondary | ICD-10-CM | POA: Insufficient documentation

## 2021-05-04 DIAGNOSIS — Z90710 Acquired absence of both cervix and uterus: Secondary | ICD-10-CM | POA: Diagnosis not present

## 2021-05-04 DIAGNOSIS — Z7984 Long term (current) use of oral hypoglycemic drugs: Secondary | ICD-10-CM | POA: Insufficient documentation

## 2021-05-04 DIAGNOSIS — Z7982 Long term (current) use of aspirin: Secondary | ICD-10-CM | POA: Diagnosis not present

## 2021-05-04 DIAGNOSIS — E119 Type 2 diabetes mellitus without complications: Secondary | ICD-10-CM | POA: Diagnosis not present

## 2021-05-04 DIAGNOSIS — C541 Malignant neoplasm of endometrium: Secondary | ICD-10-CM | POA: Insufficient documentation

## 2021-05-04 DIAGNOSIS — Z90722 Acquired absence of ovaries, bilateral: Secondary | ICD-10-CM | POA: Insufficient documentation

## 2021-05-04 NOTE — Patient Instructions (Signed)
Please notify Dr Denman George at phone number [phone] if you notice vaginal bleeding, new pelvic or abdominal pains, bloating, feeling full easy, or a change in bladder or bowel function.   Dr Denman George is departing the [place] at [facility] in October, 2022. Her partners and colleagues including Dr Berline Lopes, Dr Delsa Sale and [name], Nurse Practitioner will be available to continue your care.   You are next scheduled to return to the Gynecologic Oncology office at the [facility] in March, 2023. Please call [phone] in January, 2023 to request an appointment for March with Dr Serita Grit partner, Dr Delsa Sale.

## 2021-05-04 NOTE — Progress Notes (Signed)
Gynecologic Oncology Follow-up Note  Stacey Wise 65 y.o. female  Chief Complaint  Patient presents with   Endometrial cancer ([place])    Assessment and Plan : Stage IA grade 1 endometrioid endometrial adenocarcinoma (MMR normal/MSI stable) in the setting of morbid obesity and poorly controlled diabetes. S/p robotic staging with hysterectomy >250gm, BSO and minilap on 10/13/20.   Pathology revealed low risk factors for recurrence, therefore no adjuvant therapy was recommended according to NCCN guidelines.  I discussed risk for recurrence and typical symptoms encouraged her to notify us of these should they develop between visits.  I recommend she have follow-up every 6 months for 5 years in accordance with NCCN guidelines. Those visits should include symptom assessment, physical exam and pelvic examination. Pap smears are not indicated or recommended in the routine surveillance of endometrial cancer.  She will follow-up with my partner, Dr Stacey Wise as I am leaving the practice in October.   HPI: 65 year old Afro-American female seen in consultation request of Dr. Vanessa Wise regarding management of a newly diagnosed endometrial cancer. The patient's had a long history of irregular bleeding which is gradually become heavier. Patient underwent hysteroscopy and endometrial biopsy showing a grade 1 endometrial adenocarcinoma. Subsequently the patient's had minimal bleeding. Ultrasound showing the patient has fibroids. Management options were considered at the time of diagnosis and we decided to treat using a Mirena IUD which was placed in November 2017.  Following initial consultation the patient had a Mirena IUD placed. She's had no difficulty with the IUD. She denies any pelvic pain pressure or any vaginal bleeding.  The patient underwent D&C on 01/19/17. Mirena IUD was kept in situ during the procedure.  The pathology confirmed persistent grade1-2 endometrial cancer.  Stacey Wise  experience no weight loss in 4 years.  TVUS in September, 2019 showed a bulky 13x6x6cm uterus with fibroids and a 43m endometrium. Unable to see IUD due to acoustic shadowing.   After several years of no vaginal bleeding, she developed an episode of vaginal bleeding in August, 2021. A TVUS was ordered and performed on May 18, 2020.  This revealed a uterus measuring 8.2 x 5 x 7.2 cm with intramural fibroids the largest of which measuring 5 cm in the lower uterine segment.  The endometrium could not be visualized due to fibroids.  The IUD was not definitively seen.  The ovaries were not visualized.  Interval history:  Due to her onset of bleeding symptoms and the "age" of her IUD she was recommended to undergo either D&C replacement of IUD or attempted robotic hysterectomy.  On 10/13/20 she underwent robotic assisted total hysterectomy for a uterus >250gm, BSO and minilaparotomy for specimen delivery. Intraoperative findings were significant for 16cm bulky fibroid uterus, normal appearing tubes and ovaries, small umbilical hernia without incarceration of visceral structures, extreme intraperitoneal adiposity and difficulty visualizing pelvic structures. Surgery was challenging due to body habitus and poor visualization, but without complications.  Final pathology revealed a FIGO grade 1 endometrioid endometrial adenocarcinoma with 2 of 729mmyometrial invasion (inner half), no LVSI, no involvement of the cervix or ovaries. She was determined to have low risk cancer and no adjuvant therapy was recommended in accordance with NCCN guidelines. MMR was normal and MSI stable.   She has no symptoms concerning for recurrent cancer.   Review of Systems:10 point review of systems is negative except as noted in interval history.   Vitals: Blood pressure (!) 165/68, pulse 91, temperature 98.4 F (36.9 C), temperature source Oral, resp.  rate 18, weight (!) 316 lb (143.3 kg), last menstrual period  06/23/2016, SpO2 99 %. Physical Exam: WD in NAD Neck  Supple NROM, without any enlargements.  Lymph Node Survey No cervical supraclavicular or inguinal adenopathy Cardiovascular  Well perfused peripheries Lungs  No increased WOB Skin  No rash/lesions/breakdown  Psychiatry  Alert and oriented to person, place, and time  Abdomen  Normoactive bowel sounds, abdomen soft, non-tender and obese without evidence of hernia. Incisions well healed Back No CVA tenderness Genito Urinary  Vulva/vagina: vaginal cuff in tact, healing normally.  Rectal  deferred Extremities  No bilateral cyanosis, clubbing or edema.       Allergies  Allergen Reactions   Empagliflozin-Metformin Hcl     Other reaction(s): headaches and urine frequency    Past Medical History:  Diagnosis Date   Arthritis    Cancer ([place])    intrauterine cancer    Complication of anesthesia    hard to wake up from parathyroidectomy   Diabetes mellitus without complication ([place])    per patient " i am a type 2 diabetic"   DIABETIC PERIPHERAL NEUROPATHY 02/28/2007   DYSLIPIDEMIA 02/28/2007   Family history of anesthesia complication    sister hard to wake up   Family history of peritoneal cancer    Family history of prostate cancer    Heel spur, right    Hepatitis    "states she has only been vaccinated but never had"   HYPERTENSION 02/28/2007   Morbid obesity ([place]) 02/28/2007   Pneumonia    x2   SLEEP APNEA 02/28/2007   does not use a cpap-could not use   Trigger finger, left ring finger    Uterine fibroid     Past Surgical History:  Procedure Laterality Date   BREAST LUMPECTOMY WITH NEEDLE LOCALIZATION Left 01/01/2013   Procedure: LEFT BREAST LUMPECTOMY WITH NEEDLE LOCALIZATION;  Surgeon: Stacey Moores A. Cornett, MD;  Location: [facility];  Service: General;  Laterality: Left;   CERVICAL BIOPSY  W/ LOOP ELECTRODE EXCISION     CERVICAL POLYPECTOMY N/A 06/27/2016   Procedure: CERVICAL POLYPECTOMY;  Surgeon:  Stacey Kick, MD;  Location: [facility];  Service: Gynecology;  Laterality: N/A;   DILATION AND CURETTAGE OF UTERUS  2009   DILATION AND CURETTAGE OF UTERUS N/A 06/27/2016   Procedure: DILATATION AND CURETTAGE;  Surgeon: Stacey Kick, MD;  Location: [facility];  Service: Gynecology;  Laterality: N/A;   DILATION AND CURETTAGE OF UTERUS N/A 01/19/2017   Procedure: DILATATION AND CURETTAGE;  Surgeon: Everitt Amber, MD;  Location: [facility];  Service: Gynecology;  Laterality: N/A;   ELECTROCARDIOGRAM  11/19/2004   HYSTEROSCOPY N/A 06/27/2016   Procedure: HYSTEROSCOPY;  Surgeon: Stacey Kick, MD;  Location: [facility];  Service: Gynecology;  Laterality: N/A;   PARATHYROIDECTOMY Left 06/30/2015   Procedure: LEFT PARATHYROIDECTOMY;  Surgeon: Armandina Gemma, MD;  Location: [facility];  Service: General;  Laterality: Left;   ROBOTIC ASSISTED TOTAL HYSTERECTOMY WITH BILATERAL SALPINGO OOPHERECTOMY Bilateral 10/13/2020   Procedure: XI ROBOTIC ASSISTED TOTAL HYSTERECTOMY WITH BILATERAL SALPINGO OOPHORECTOMY GREATER THAN 250GMS, MINI LAPAROTOMY;  Surgeon: Everitt Amber, MD;  Location: [facility];  Service: Gynecology;  Laterality: Bilateral;   TUBAL LIGATION  1996    Current Outpatient Medications  Medication Sig Dispense Refill   aspirin 81 MG tablet Take 81 mg by mouth daily.     atorvastatin (LIPITOR) 20 MG tablet Take 20 mg by mouth at bedtime.      diltiazem (TIAZAC) 300 MG 24 hr  capsule TAKE ONE CAPSULE EVERY DAY (Patient taking differently: Take 300 mg by mouth daily.) 90 capsule 3   hydrALAZINE (APRESOLINE) 50 MG tablet Take 50 mg by mouth 3 (three) times daily.     ibuprofen (ADVIL) 600 MG tablet Take 1 tablet (600 mg total) by mouth every 6 (six) hours as needed for moderate pain. For AFTER surgery 30 tablet 0   insulin aspart protamine- aspart (NOVOLOG MIX 70/30) (70-30) 100 UNIT/ML injection Inject into the skin. Flex pen per pt Has not started will start once completes stock of Humalog 75.25 .  Dose will be 40 units in the am  and 20 u units in the pm     losartan (COZAAR) 100 MG tablet Take 100 mg by mouth daily.     metFORMIN (GLUCOPHAGE-XR) 500 MG 24 hr tablet Take 500 mg by mouth 3 (three) times daily.  4   torsemide (DEMADEX) 20 MG tablet Take 20 mg by mouth daily in the afternoon. Pt takes in the am     TRULICITY 1.5 UY/4.0HK SOPN Inject 1.5 mg into the skin once a week.     No current facility-administered medications for this visit.    Social History   Socioeconomic History   Marital status: Single    Spouse name: Not on file   Number of children: Not on file   Years of education: Not on file   Highest education level: Not on file  Occupational History   Occupation: Sales executive for Architectural technologist: [employer]  Tobacco Use   Smoking status: Never   Smokeless tobacco: Never  Vaping Use   Vaping Use: Never used  Substance and Sexual Activity   Alcohol use: No   Drug use: No   Sexual activity: Not on file  Other Topics Concern   Not on file  Social History Narrative   Not on file   Social Determinants of Health   Financial Resource Strain: Not on file  Food Insecurity: Not on file  Transportation Needs: Not on file  Physical Activity: Not on file  Stress: Not on file  Social Connections: Not on file  Intimate Partner Violence: Not on file    Family History  Problem Relation Age of Onset   Diabetes Mother    Arthritis Mother    Hypertension Mother    Arthritis Father    Dementia Father    Alzheimer's disease Father    Prostate cancer Father        Prostate   Cancer Sister        Lymphoma   Arthritis Sister    Arthritis Brother    Prostate cancer Brother    Cancer Sister 76       Peritoneal   Arthritis Sister    Arthritis Sister    Arthritis Sister    Arthritis Brother    Diabetes Other        Sibling   Diabetes Other        Sibling   Prostate cancer Paternal Grandfather      Thereasa Solo, MD 05/04/2021, 12:10 PM

## 2021-05-10 DIAGNOSIS — U071 COVID-19: Secondary | ICD-10-CM | POA: Diagnosis not present

## 2021-07-29 DIAGNOSIS — Z794 Long term (current) use of insulin: Secondary | ICD-10-CM | POA: Diagnosis not present

## 2021-07-29 DIAGNOSIS — E559 Vitamin D deficiency, unspecified: Secondary | ICD-10-CM | POA: Diagnosis not present

## 2021-07-29 DIAGNOSIS — E210 Primary hyperparathyroidism: Secondary | ICD-10-CM | POA: Diagnosis not present

## 2021-07-29 DIAGNOSIS — M8580 Other specified disorders of bone density and structure, unspecified site: Secondary | ICD-10-CM | POA: Diagnosis not present

## 2021-07-29 DIAGNOSIS — I10 Essential (primary) hypertension: Secondary | ICD-10-CM | POA: Diagnosis not present

## 2021-07-29 DIAGNOSIS — E1165 Type 2 diabetes mellitus with hyperglycemia: Secondary | ICD-10-CM | POA: Diagnosis not present

## 2021-07-29 DIAGNOSIS — Z5181 Encounter for therapeutic drug level monitoring: Secondary | ICD-10-CM | POA: Diagnosis not present

## 2021-07-29 DIAGNOSIS — F331 Major depressive disorder, recurrent, moderate: Secondary | ICD-10-CM | POA: Diagnosis not present

## 2021-08-12 DIAGNOSIS — E1165 Type 2 diabetes mellitus with hyperglycemia: Secondary | ICD-10-CM | POA: Diagnosis not present

## 2021-08-12 DIAGNOSIS — Z794 Long term (current) use of insulin: Secondary | ICD-10-CM | POA: Diagnosis not present

## 2021-08-12 DIAGNOSIS — M8580 Other specified disorders of bone density and structure, unspecified site: Secondary | ICD-10-CM | POA: Diagnosis not present

## 2021-08-26 DIAGNOSIS — M79672 Pain in left foot: Secondary | ICD-10-CM | POA: Diagnosis not present

## 2021-08-26 DIAGNOSIS — M7662 Achilles tendinitis, left leg: Secondary | ICD-10-CM | POA: Diagnosis not present

## 2021-08-26 DIAGNOSIS — M7732 Calcaneal spur, left foot: Secondary | ICD-10-CM | POA: Diagnosis not present

## 2021-08-26 DIAGNOSIS — M722 Plantar fascial fibromatosis: Secondary | ICD-10-CM | POA: Diagnosis not present

## 2021-09-09 DIAGNOSIS — M71572 Other bursitis, not elsewhere classified, left ankle and foot: Secondary | ICD-10-CM | POA: Diagnosis not present

## 2021-09-09 DIAGNOSIS — M722 Plantar fascial fibromatosis: Secondary | ICD-10-CM | POA: Diagnosis not present

## 2021-09-10 ENCOUNTER — Telehealth: Payer: Self-pay

## 2021-09-10 NOTE — Telephone Encounter (Signed)
Former patient of Dr. Denman George called to set up an follow-up appointment to see Dr. Delsa Sale.  Scheduled her appointment on Thursday, March 9th @ 9 am.

## 2021-09-23 DIAGNOSIS — M71572 Other bursitis, not elsewhere classified, left ankle and foot: Secondary | ICD-10-CM | POA: Diagnosis not present

## 2021-09-23 DIAGNOSIS — M722 Plantar fascial fibromatosis: Secondary | ICD-10-CM | POA: Diagnosis not present

## 2021-11-02 ENCOUNTER — Encounter: Payer: Self-pay | Admitting: Obstetrics & Gynecology

## 2021-11-02 NOTE — Assessment & Plan Note (Signed)
Stage IA grade 1 endometrioid endometrial adenocarcinoma  ?Negative symptom review, normal exam.  No evidence of recurrence ?? ?Recommend she have follow-up every 6 months for 5 years in accordance with NCCN guidelines. Those visits should include symptom assessment, physical exam and pelvic examination.  ?

## 2021-11-02 NOTE — Progress Notes (Unsigned)
Follow Up Note: Gyn-Onc ? ?Stacey Wise 66 y.o. female ? ?CC: She returns for a f/u visit ? ? ?HPI: The oncology history was reviewed. ? ?Interval History: She denies any vaginal bleeding, abdominal/pelvic pain, cough, lethargy or increasing abdominal girth.  ? ?Review of Systems  ?Review of Systems  ?Constitutional:  Negative for malaise/fatigue and weight loss.  ?Respiratory:  Negative for shortness of breath and wheezing.   ?Cardiovascular:  Negative for chest pain and leg swelling.  ?Gastrointestinal:  Negative for abdominal pain, blood in stool, constipation, nausea and vomiting.  ?Genitourinary:  Negative for dysuria, frequency, hematuria and urgency.  ?Musculoskeletal:  Negative for joint pain and myalgias.  ?Neurological:  Negative for weakness.  ?Psychiatric/Behavioral:  Negative for depression. The patient does not have insomnia.   ? ?Current medications, allergy, social history, past surgical history, past medical history, family history were all reviewed. ? ? ? ?Vitals:  Last menstrual period 06/23/2016. ? ?Physical Exam:  ?Physical Exam ?Exam conducted with a chaperone present.  ?Constitutional:   ?   General: She is not in acute distress. ?Cardiovascular:  ?   Rate and Rhythm: Normal rate and regular rhythm.  ?Pulmonary:  ?   Effort: Pulmonary effort is normal.  ?   Breath sounds: Normal breath sounds. No wheezing or rhonchi.  ?Abdominal:  ?   Palpations: Abdomen is soft.  ?   Tenderness: There is no abdominal tenderness. There is no right CVA tenderness or left CVA tenderness.  ?   Hernia: No hernia is present.  ?Genitourinary: ?   General: Normal vulva.  ?   Urethra: No urethral lesion.  ?   Vagina: No lesions. No bleeding ?Musculoskeletal:  ?   Cervical back: Neck supple.  ?   Right lower leg: No edema.  ?   Left lower leg: No edema.  ?Lymphadenopathy:  ?   Upper Body:  ?   Right upper body: No supraclavicular adenopathy.  ?   Left upper body: No supraclavicular adenopathy.  ?   Lower Body: No  right inguinal adenopathy. No left inguinal adenopathy.  ?Skin: ?   Findings: No rash.  ?Neurological:  ?   Mental Status: She is oriented to person, place, and time.  ? ?Assessment/Plan: *** ? ? ?Lahoma Crocker, MD  ?

## 2021-11-04 ENCOUNTER — Ambulatory Visit: Payer: BC Managed Care – PPO | Admitting: Obstetrics & Gynecology

## 2021-11-11 ENCOUNTER — Inpatient Hospital Stay: Payer: BC Managed Care – PPO | Attending: Obstetrics & Gynecology | Admitting: Obstetrics & Gynecology

## 2021-11-11 ENCOUNTER — Other Ambulatory Visit: Payer: Self-pay

## 2021-11-11 ENCOUNTER — Encounter: Payer: Self-pay | Admitting: Obstetrics & Gynecology

## 2021-11-11 DIAGNOSIS — Z8542 Personal history of malignant neoplasm of other parts of uterus: Secondary | ICD-10-CM | POA: Diagnosis not present

## 2021-11-11 NOTE — Assessment & Plan Note (Signed)
Stage IA Gr1 endometrioid endometrial adenocarcinoma Negative symptom review, normal exam.  No evidence of recurrence  >continue f/u q 6 mos 

## 2021-11-11 NOTE — Progress Notes (Signed)
Follow Up Note: Gyn-Onc ? ?Stacey Wise 66 y.o. female ? ?CC: She returns for a f/u visit ? ? ?HPI: The oncology history was reviewed. ? ?Interval History: She denies any vaginal bleeding, abdominal/pelvic pain, cough, lethargy or increasing abdominal girth.  ? ?Review of Systems  ?Review of Systems  ?Constitutional:  Negative for malaise/fatigue and weight loss.  ?Respiratory:  Negative for shortness of breath and wheezing.   ?Cardiovascular:  Negative for chest pain and leg swelling.  ?Gastrointestinal:  Negative for abdominal pain, blood in stool, constipation, nausea and vomiting.  ?Genitourinary:  Negative for dysuria, frequency, hematuria and urgency.  ?Musculoskeletal:  Negative for joint pain and myalgias.  ?Neurological:  Negative for weakness.  ?Psychiatric/Behavioral:  Negative for depression. The patient does not have insomnia.   ? ?Current medications, allergy, social history, past surgical history, past medical history, family history were all reviewed. ? ? ? ?Vitals:  BP (!) 155/76 Comment: MD notified  Pulse 80   Temp 97.7 ?F (36.5 ?C) (Oral)   Resp 16   Ht '5\' 4"'$  (1.626 m)   Wt (!) 323 lb (146.5 kg)   LMP 06/23/2016   BMI 55.44 kg/m?   ? ? ?Physical Exam ?Exam conducted with a chaperone present.  ?Constitutional:   ?   General: She is not in acute distress. ?Cardiovascular:  ?   Rate and Rhythm: Normal rate and regular rhythm.  ?Pulmonary:  ?   Effort: Pulmonary effort is normal.  ?   Breath sounds: Normal breath sounds. No wheezing or rhonchi.  ?Abdominal:  ?   Palpations: Abdomen is soft.  ?   Tenderness: There is no abdominal tenderness. There is no right CVA tenderness or left CVA tenderness.  ?   Hernia: No hernia is present.  ?Genitourinary: ?   General: Normal vulva.  ?   Urethra: No urethral lesion.  ?   Vagina: No lesions. No bleeding ?Musculoskeletal:  ?   Cervical back: Neck supple.  ?   Right lower leg: No edema.  ?   Left lower leg: No edema.  ?Lymphadenopathy:  ?   Upper Body:   ?   Right upper body: No supraclavicular adenopathy.  ?   Left upper body: No supraclavicular adenopathy.  ?   Lower Body: No right inguinal adenopathy. No left inguinal adenopathy.  ?Skin: ?   Findings: No rash.  ?Neurological:  ?   Mental Status: She is oriented to person, place, and time.  ? ?Assessment/Plan: Endometrial cancer ([place]) ?Stage IA Gr1 endometrioid endometrial adenocarcinoma ?Negative symptom review, normal exam.  No evidence of recurrence ? ?>continue f/u q 6 mos  ? ? ?Lahoma Crocker, MD  ?

## 2021-11-11 NOTE — Patient Instructions (Signed)
Return in 8/23 ?

## 2021-11-30 DIAGNOSIS — I10 Essential (primary) hypertension: Secondary | ICD-10-CM | POA: Diagnosis not present

## 2021-11-30 DIAGNOSIS — Z794 Long term (current) use of insulin: Secondary | ICD-10-CM | POA: Diagnosis not present

## 2021-11-30 DIAGNOSIS — E210 Primary hyperparathyroidism: Secondary | ICD-10-CM | POA: Diagnosis not present

## 2021-11-30 DIAGNOSIS — Z5181 Encounter for therapeutic drug level monitoring: Secondary | ICD-10-CM | POA: Diagnosis not present

## 2021-11-30 DIAGNOSIS — E1165 Type 2 diabetes mellitus with hyperglycemia: Secondary | ICD-10-CM | POA: Diagnosis not present

## 2021-11-30 DIAGNOSIS — E559 Vitamin D deficiency, unspecified: Secondary | ICD-10-CM | POA: Diagnosis not present

## 2022-03-13 IMAGING — US US PELVIS COMPLETE WITH TRANSVAGINAL
1 series · 13 of 25 positions shown · non-contrast
Comparison: Prior ultrasound from 05/09/2018.

CLINICAL DATA: Initial evaluation for new vaginal bleeding. History
of endometrial cancer with IUD in place.



[Series 1: us pelvis complete with transvaginal · 58 acquisitions, 13 frames shown]
[im 1/58]
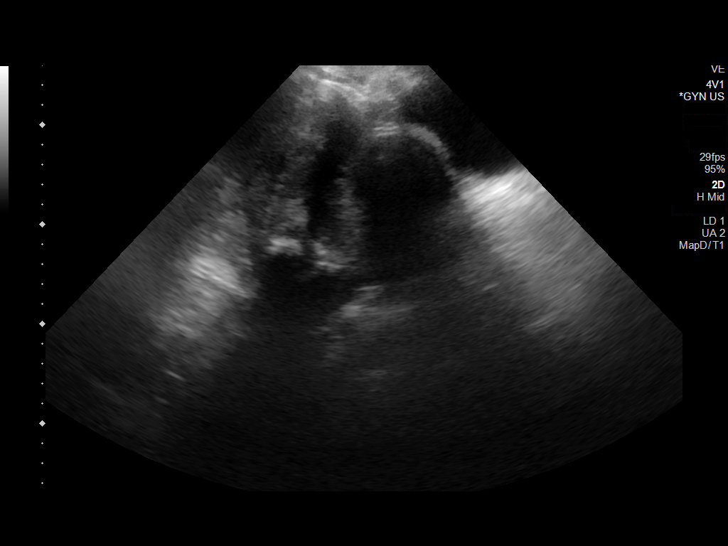
[im 5/58]
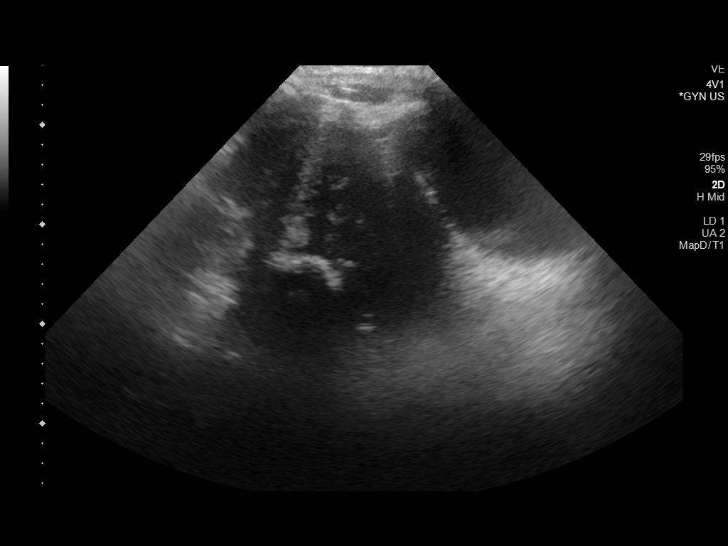
[im 10/58]
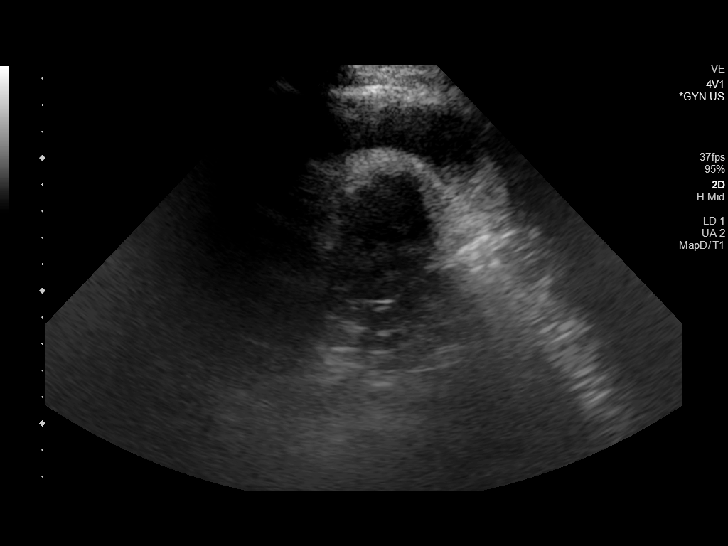
[im 15/58]
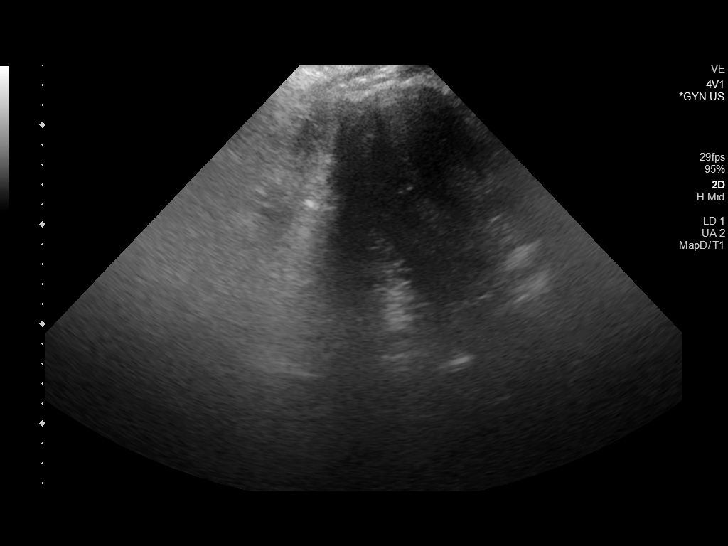
[im 20/58]
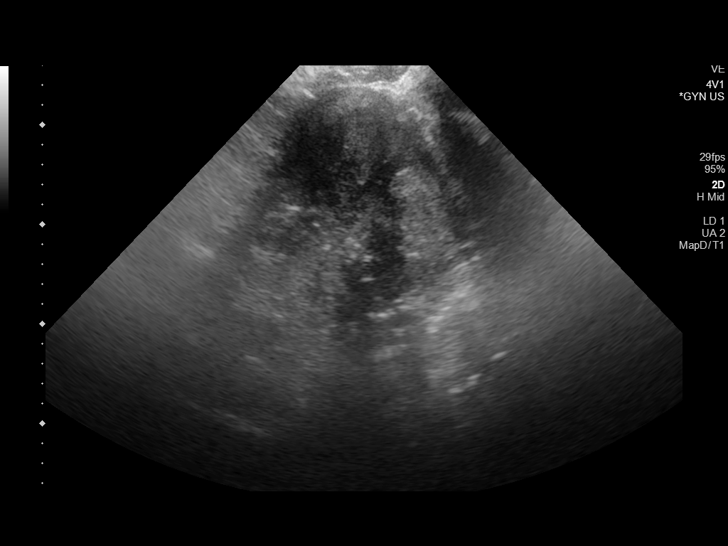
[im 24/58]
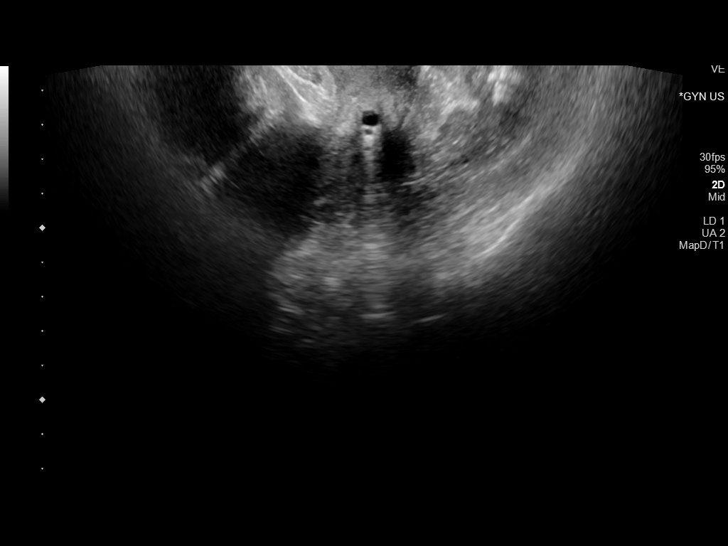
[im 29/58]
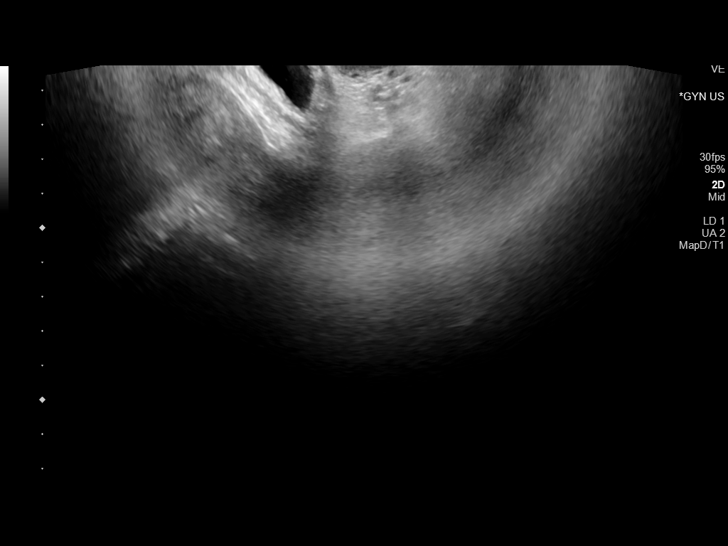
[im 34/58]
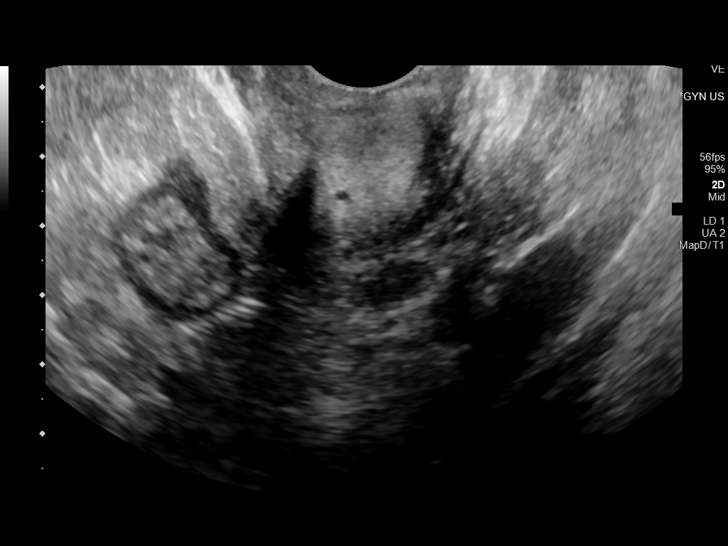
[im 39/58]
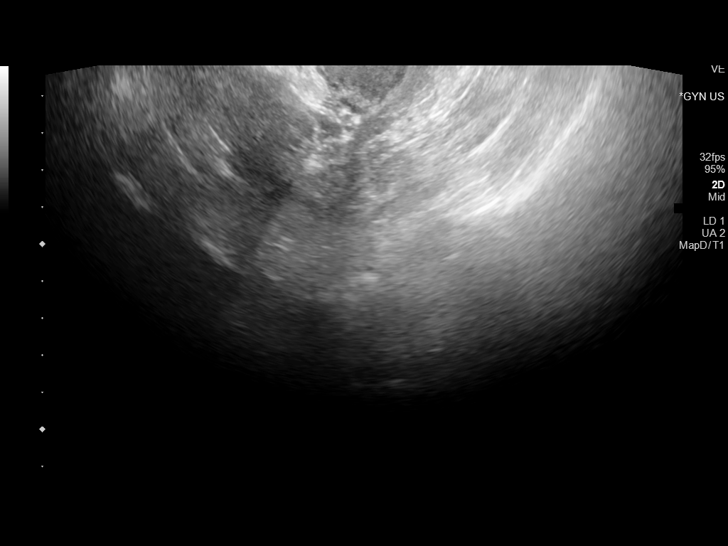
[im 43/58]
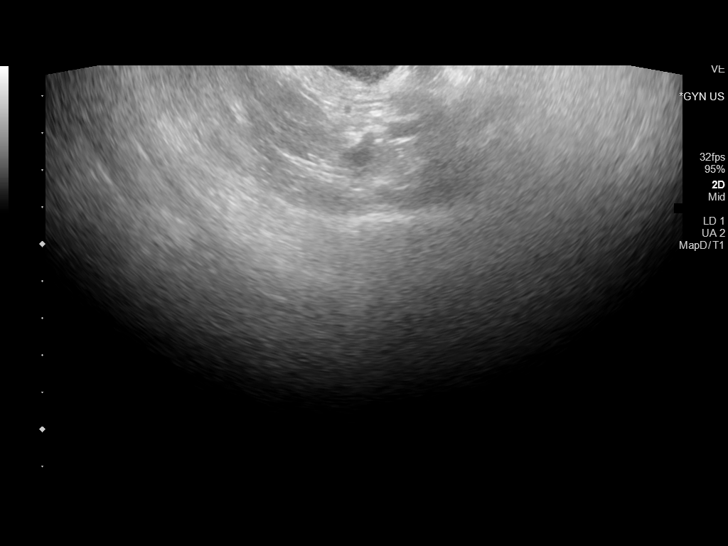
[im 48/58]
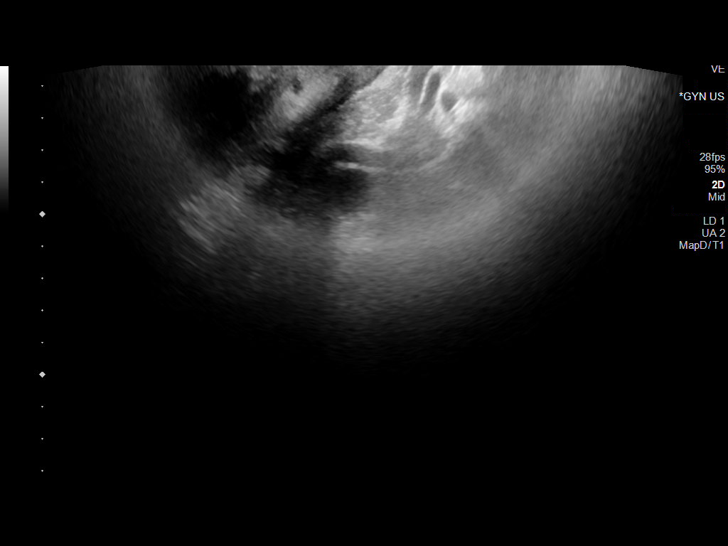
[im 53/58]
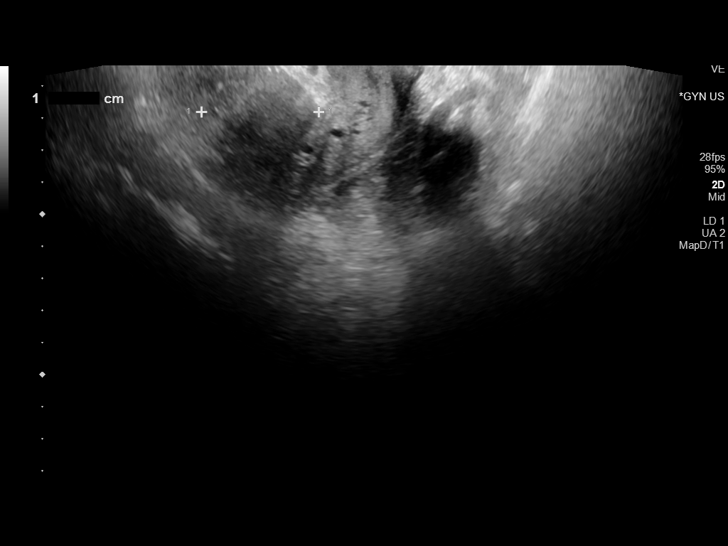
[im 58/58]
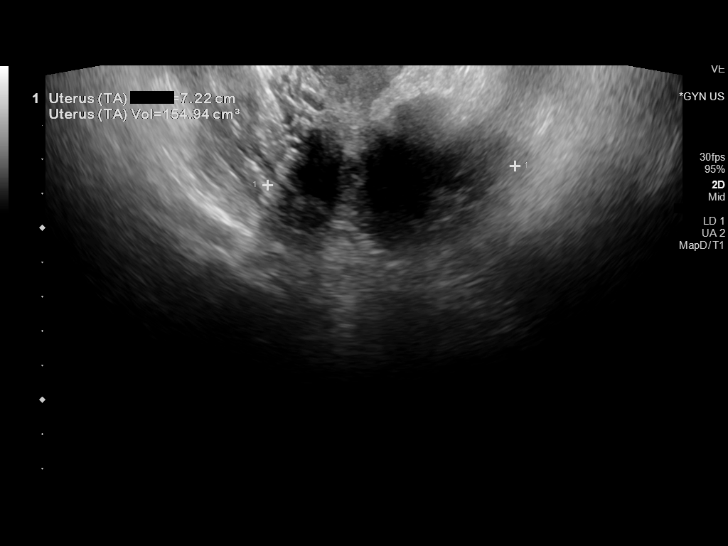

[13 of 25 positions shown; findings below may reference images not displayed]

FINDINGS: Uterus

Measurements: 8.2 x 5.0 x 7.2 cm = volume: 155 mL. Approximate 2.9 x
3.2 x 5.0 cm intramural fibroid seen positioned at the lower uterine
segment. Additional 3.5 x 2.5 x 2.8 cm intramural fibroid present at
the central uterine fundus. These fibroids appear partially
calcified.

Endometrium

Not visualized due to overlying fibroids. IUD is not definitely
seen.

Right ovary

Not visualized.  No adnexal mass.

Left ovary

Not visualized.  No adnexal mass.

Other findings

No abnormal free fluid.
IMPRESSION: 1. Multiple uterine fibroids, some of which are calcified, measuring
up to 5 cm.
2. Examination is nondiagnostic in evaluation of the endometrial
complex, obscured by overlying fibroids. IUD not definitely
visualized as well. Follow-up examination with dedicated abdominal
radiograph could be performed for further evaluation.
3. Nonvisualization of either ovary.  No adnexal mass or free fluid.

## 2022-04-05 ENCOUNTER — Encounter: Payer: Self-pay | Admitting: Obstetrics & Gynecology

## 2022-04-06 ENCOUNTER — Inpatient Hospital Stay: Payer: Medicare Other | Attending: Obstetrics & Gynecology | Admitting: Obstetrics & Gynecology

## 2022-04-06 ENCOUNTER — Encounter: Payer: Self-pay | Admitting: Obstetrics & Gynecology

## 2022-04-06 ENCOUNTER — Other Ambulatory Visit: Payer: Self-pay

## 2022-04-06 VITALS — BP 148/67 | HR 76 | Resp 17 | Wt 315.2 lb

## 2022-04-06 DIAGNOSIS — Z8542 Personal history of malignant neoplasm of other parts of uterus: Secondary | ICD-10-CM | POA: Diagnosis present

## 2022-04-06 DIAGNOSIS — C541 Malignant neoplasm of endometrium: Secondary | ICD-10-CM

## 2022-04-06 NOTE — Progress Notes (Signed)
Follow Up Note: Gyn-Onc  Stacey Wise 66 y.o. female  CC: She returns for a f/u visit   HPI: The oncology history was reviewed.  Interval History: She denies any vaginal bleeding, abdominal/pelvic pain, cough, lethargy or increasing abdominal girth.   Review of Systems  Review of Systems  Constitutional:  Negative for malaise/fatigue and weight loss.  Respiratory:  Negative for shortness of breath and wheezing.   Cardiovascular:  Negative for chest pain and leg swelling.  Gastrointestinal:  Negative for abdominal pain, blood in stool, constipation, nausea and vomiting.  Genitourinary:  Negative for dysuria, frequency, hematuria and urgency.  Musculoskeletal:  Negative for joint pain and myalgias.  Neurological:  Negative for weakness.  Psychiatric/Behavioral:  Negative for depression. The patient does not have insomnia.    Current medications, allergy, social history, past surgical history, past medical history, family history were all reviewed.    Vitals:  BP (!) 148/67 (BP Location: Right Arm, Patient Position: Sitting)   Pulse 76   Resp 17   Wt (!) 315 lb 4 oz (143 kg)   LMP 06/23/2016   SpO2 98%   BMI 54.11 kg/m    Physical Exam:  Physical Exam Exam conducted with a chaperone present.  Constitutional:      General: She is not in acute distress. Cardiovascular:     Rate and Rhythm: Normal rate and regular rhythm.  Pulmonary:     Effort: Pulmonary effort is normal.     Breath sounds: Normal breath sounds. No wheezing or rhonchi.  Abdominal:     Palpations: Abdomen is soft.     Tenderness: There is no abdominal tenderness. There is no right CVA tenderness or left CVA tenderness.     Hernia: No hernia is present.  Genitourinary:    General: Normal vulva.     Urethra: No urethral lesion.     Vagina: No lesions. No bleeding Musculoskeletal:     Cervical back: Neck supple.     Right lower leg: No edema.     Left lower leg: No edema.  Lymphadenopathy:      Upper Body:     Right upper body: No supraclavicular adenopathy.     Left upper body: No supraclavicular adenopathy.     Lower Body: No right inguinal adenopathy. No left inguinal adenopathy.  Skin:    Findings: No rash.  Neurological:     Mental Status: She is oriented to person, place, and time.   Assessment/Plan:  Endometrial cancer ([facility]) Stage IA Gr1 endometrioid endometrial adenocarcinoma Negative symptom review, normal exam.  No evidence of recurrence  >continue f/u q 6 mos   I personally spent 25 minutes face-to-face and non-face-to-face in the care of this patient, which includes all pre, intra, and post visit time on the date of service.    Lahoma Crocker, MD

## 2022-04-06 NOTE — Patient Instructions (Signed)
Return in 6 months

## 2022-04-06 NOTE — Assessment & Plan Note (Signed)
Stage IA Gr1 endometrioid endometrial adenocarcinoma Negative symptom review, normal exam.  No evidence of recurrence  >continue f/u q 6 mos

## 2022-10-12 ENCOUNTER — Other Ambulatory Visit: Payer: Self-pay

## 2022-10-12 ENCOUNTER — Encounter: Payer: Self-pay | Admitting: Obstetrics & Gynecology

## 2022-10-12 ENCOUNTER — Inpatient Hospital Stay: Payer: Medicare Other | Attending: Obstetrics & Gynecology | Admitting: Obstetrics & Gynecology

## 2022-10-12 VITALS — BP 150/80 | HR 78 | Temp 98.2°F | Resp 18 | Ht 64.0 in | Wt 311.0 lb

## 2022-10-12 DIAGNOSIS — Z8542 Personal history of malignant neoplasm of other parts of uterus: Secondary | ICD-10-CM | POA: Insufficient documentation

## 2022-10-12 DIAGNOSIS — C541 Malignant neoplasm of endometrium: Secondary | ICD-10-CM

## 2022-10-12 NOTE — Patient Instructions (Signed)
Follow up with Dr. Vanessa Kick in 6 months.  Return in 1 year.

## 2022-10-12 NOTE — Progress Notes (Signed)
Follow Up Note: Gyn-Onc  Stacey Wise 67 y.o. female  CC: She returns for a f/u visit   HPI: The oncology history was reviewed.  Interval History: She denies any vaginal bleeding, abdominal/pelvic pain, cough, lethargy or increasing abdominal girth.   Review of Systems  Review of Systems  Constitutional:  Negative for malaise/fatigue and weight loss.  Respiratory:  Negative for shortness of breath and wheezing.   Cardiovascular:  Negative for chest pain and leg swelling.  Gastrointestinal:  Negative for abdominal pain, blood in stool, constipation, nausea and vomiting.  Genitourinary:  Negative for dysuria, frequency, hematuria and urgency.  Musculoskeletal:  Negative for joint pain and myalgias.  Neurological:  Negative for weakness.  Psychiatric/Behavioral:  Negative for depression. The patient does not have insomnia.    Current medications, allergy, social history, past surgical history, past medical history, family history were all reviewed.    Vitals:  BP (!) 150/80 Comment: manual recheck  Pulse 78   Temp 98.2 F (36.8 C) (Oral)   Resp 18   Ht 5' 4"$  (1.626 m)   Wt (!) 311 lb (141.1 kg)   LMP 06/23/2016   SpO2 100%   BMI 53.38 kg/m     Physical Exam:  Physical Exam Exam conducted with a chaperone present.  Constitutional:      General: She is not in acute distress. Cardiovascular:     Rate and Rhythm: Normal rate and regular rhythm.  Pulmonary:     Effort: Pulmonary effort is normal.     Breath sounds: Normal breath sounds. No wheezing or rhonchi.  Abdominal:     Palpations: Abdomen is soft.     Tenderness: There is no abdominal tenderness. There is no right CVA tenderness or left CVA tenderness.     Hernia: No hernia is present.  Genitourinary:    General: Normal vulva.     Urethra: No urethral lesion.     Vagina: No lesions. No bleeding Musculoskeletal:     Cervical back: Neck supple.     Right lower leg: No edema.     Left lower leg: No edema.   Lymphadenopathy:     Upper Body:     Right upper body: No supraclavicular adenopathy.     Left upper body: No supraclavicular adenopathy.     Lower Body: No right inguinal adenopathy. No left inguinal adenopathy.  Skin:    Findings: No rash.  Neurological:     Mental Status: She is oriented to person, place, and time.   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  No evidence of recurrence   >continue f/u q 6 mos    I personally spent 25 minutes face-to-face and non-face-to-face in the care of this patient, which includes all pre, intra, and post visit time on the date of service.    Lahoma Crocker, MD

## 2022-10-12 NOTE — Assessment & Plan Note (Signed)
Endometrial cancer ([place]) Stage IA Gr1 endometrioid endometrial adenocarcinoma Negative symptom review, normal exam.  No evidence of recurrence   >continue f/u q 6 mos

## 2023-02-06 ENCOUNTER — Telehealth: Payer: Self-pay

## 2023-02-06 NOTE — Telephone Encounter (Signed)
Pt called to schedule 6 month f/u (August) with Dr. Tamela Oddi.  Informed pt that schedule is not out yet for August. Pt stated that she would like a morning appt and appt in the beginning of August if possible.  Informed pt someone from the office will call her once schedule is made. Pt verbalized understanding.

## 2023-04-11 ENCOUNTER — Inpatient Hospital Stay: Payer: Medicare Other | Admitting: Obstetrics & Gynecology

## 2023-04-11 ENCOUNTER — Other Ambulatory Visit: Payer: Self-pay

## 2023-04-11 ENCOUNTER — Encounter: Payer: Self-pay | Admitting: Obstetrics & Gynecology

## 2023-04-11 VITALS — BP 137/55 | HR 78 | Temp 98.2°F | Resp 19 | Ht 64.5 in | Wt 312.2 lb

## 2023-04-11 DIAGNOSIS — Z8542 Personal history of malignant neoplasm of other parts of uterus: Secondary | ICD-10-CM | POA: Diagnosis not present

## 2023-04-11 DIAGNOSIS — C541 Malignant neoplasm of endometrium: Secondary | ICD-10-CM

## 2023-04-11 NOTE — Progress Notes (Signed)
Follow Up Note: Gyn-Onc  Timoteo Expose 67 y.o. female  CC: She returns for a f/u visit   HPI: The oncology history was reviewed.  Interval History: She denies any vaginal bleeding, abdominal/pelvic pain, cough, lethargy or increasing abdominal girth. Depressed mood. Queries re: commercially available deodorant/antiperspirants.    Review of Systems  Review of Systems  Constitutional:  Negative for malaise/fatigue and weight loss.  Respiratory:  Negative for shortness of breath and wheezing.   Cardiovascular:  Negative for chest pain and leg swelling.  Gastrointestinal:  Negative for abdominal pain, blood in stool, constipation, nausea and vomiting.  Genitourinary:  Negative for dysuria, frequency, hematuria and urgency.  Musculoskeletal:  Negative for joint pain and myalgias.  Neurological:  Negative for weakness.  Psychiatric/Behavioral:  See above   Current medications, allergy, social history, past surgical history, past medical history, family history were all reviewed.    Vitals:  BP (!) 137/55 (BP Location: Left Arm, Patient Position: Sitting)   Pulse 78   Temp 98.2 F (36.8 C) (Oral)   Resp 19   Ht 5' 4.5" (1.638 m)   Wt (!) 312 lb 4 oz (141.6 kg)   LMP 06/23/2016   SpO2 97%   BMI 52.77 kg/m       Physical Exam Exam conducted with a chaperone present.  Constitutional:      General: She is not in acute distress. Cardiovascular:     Rate and Rhythm: Normal rate and regular rhythm.  Pulmonary:     Effort: Pulmonary effort is normal.     Breath sounds: Normal breath sounds. No wheezing or rhonchi.  Abdominal:     Palpations: Abdomen is soft.     Tenderness: There is no abdominal tenderness. There is no right CVA tenderness or left CVA tenderness.     Hernia: No hernia is present.  Genitourinary:    General: Normal vulva.     Urethra: No urethral lesion.     Vagina: No lesions. No bleeding Musculoskeletal:     Cervical back: Neck supple.     Right lower  leg: No edema.     Left lower leg: No edema.  Lymphadenopathy:     Upper Body:     Right upper body: No supraclavicular adenopathy.     Left upper body: No supraclavicular adenopathy.     Lower Body: No right inguinal adenopathy. No left inguinal adenopathy.  Skin:    Findings: No rash.  Neurological:     Mental Status: She is oriented to person, place, and time.   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  No evidence of recurrence Depressive sxs   >we discussed that topical deodorants and antiperspirants are used commonly to control body odor. Although both products are considered to be the key sector in the personal care product industry, there is limited literature available on this topic >counseled re: self care for depression >return prn or in 6 mos  .   I personally spent 30 minutes face-to-face and non-face-to-face in the care of this patient, which includes all pre, intra, and post visit time on the date of service.    Antionette Char, MD

## 2023-04-11 NOTE — Patient Instructions (Addendum)
Return in 6 months. Please call in December to schedule for a follow up in February.

## 2023-04-11 NOTE — Assessment & Plan Note (Addendum)
Endometrial cancer (HCC) Stage IA Gr1 endometrioid endometrial adenocarcinoma Negative symptom review, normal exam.  No evidence of recurrence Depressive sxs   >we discussed that topical deodorants and antiperspirants are used commonly to control body odor. Although both products are considered to be the key sector in the personal care product industry, there is limited literature available on this topic >counseled re: self care for depression >return prn or in 6 mos

## 2023-05-05 ENCOUNTER — Encounter: Payer: Self-pay | Admitting: Obstetrics and Gynecology

## 2023-09-15 ENCOUNTER — Telehealth: Payer: Self-pay | Admitting: *Deleted

## 2023-09-15 NOTE — Telephone Encounter (Signed)
Patient called and scheduled a follow up appt with Dr Tamela Oddi on 2/12

## 2023-10-05 ENCOUNTER — Encounter: Payer: Self-pay | Admitting: Obstetrics & Gynecology

## 2023-10-11 ENCOUNTER — Inpatient Hospital Stay: Payer: Medicare Other | Attending: Obstetrics & Gynecology | Admitting: Obstetrics & Gynecology

## 2023-10-11 ENCOUNTER — Encounter: Payer: Self-pay | Admitting: Obstetrics & Gynecology

## 2023-10-11 VITALS — BP 136/74 | HR 90 | Temp 97.6°F | Resp 17 | Wt 309.8 lb

## 2023-10-11 DIAGNOSIS — Z8542 Personal history of malignant neoplasm of other parts of uterus: Secondary | ICD-10-CM | POA: Insufficient documentation

## 2023-10-11 DIAGNOSIS — C541 Malignant neoplasm of endometrium: Secondary | ICD-10-CM

## 2023-10-11 NOTE — Assessment & Plan Note (Addendum)
68 yo with a h/o Stage IA Gr1 endometrioid endometrial adenocarcinoma Negative symptom review, normal exam.  No evidence of recurrence   >return prn or in 6 mos

## 2023-10-11 NOTE — Progress Notes (Signed)
Follow Up Note: Gyn-Onc  Stacey Wise Expose 68 y.o. female  CC: She returns for a f/u visit   HPI: The oncology history was reviewed.  Interval History: She denies any vaginal bleeding, abdominal pain, cough, lethargy or increasing abdominal girth. She experiences significant pain due to arthritis in her hip, which has resulted in a pinched nerve. The pain is persistent and affects her daily activities, including her ability to work, as she missed the entire month of January due to this issue. She received a cortisone injection, which has provided some relief.  She has pain in the groin area, which she attributes to her hip condition. The pain worsens when spreading her legs, indicating that certain movements exacerbate her discomfort.  Review of Systems  Review of Systems  Constitutional:  Negative for malaise/fatigue and weight loss.  Respiratory:  Negative for shortness of breath and wheezing.   Cardiovascular:  Negative for chest pain and leg swelling.  Gastrointestinal:  Negative for abdominal pain, blood in stool, constipation, nausea and vomiting.  Genitourinary:  Negative for dysuria, frequency, hematuria and urgency.  Musculoskeletal:  See above Neurological:  Negative for weakness.  Psychiatric/Behavioral:  See above   Current medications, allergy, social history, past surgical history, past medical history, family history were all reviewed.    Vitals:  BP 136/74 (BP Location: Left Arm, Patient Position: Sitting)   Pulse 90   Temp 97.6 F (36.4 C) (Oral)   Resp 17   Wt (!) 309 lb 12.8 oz (140.5 kg)   LMP 06/23/2016   SpO2 99%   BMI 52.36 kg/m       Physical Exam Exam conducted with a chaperone present.  Constitutional:      General: She is not in acute distress. Cardiovascular:     Rate and Rhythm: Normal rate and regular rhythm.  Pulmonary:     Effort: Pulmonary effort is normal.     Breath sounds: Normal breath sounds. No wheezing or rhonchi.  Abdominal:      Palpations: Abdomen is soft.     Tenderness: There is no abdominal tenderness. There is no right CVA tenderness or left CVA tenderness.     Hernia: No hernia is present.  Genitourinary:    General: Normal vulva.     Urethra: No urethral lesion.     Vagina: No lesions. No bleeding Musculoskeletal:     Cervical back: Neck supple.     Right lower leg: No edema.     Left lower leg: No edema.  Lymphadenopathy:     Upper Body:     Right upper body: No supraclavicular adenopathy.     Left upper body: No supraclavicular adenopathy.     Lower Body: No right inguinal adenopathy. No left inguinal adenopathy.  Skin:    Findings: No rash.  Neurological:     Mental Status: She is oriented to person, place, and time.   Assessment/Plan:  Endometrial cancer (HCC) 68 yo with a h/o Stage IA Gr1 endometrioid endometrial adenocarcinoma Negative symptom review, normal exam.  No evidence of recurrence   >return prn or in 6 mos   .  I personally spent 25 minutes face-to-face and non-face-to-face in the care of this patient, which includes all pre, intra, and post visit time on the date of service.    Antionette Char, MD

## 2023-10-11 NOTE — Patient Instructions (Signed)
Return in 6 months

## 2024-01-16 ENCOUNTER — Other Ambulatory Visit (HOSPITAL_BASED_OUTPATIENT_CLINIC_OR_DEPARTMENT_OTHER): Payer: Self-pay | Admitting: Internal Medicine

## 2024-01-16 DIAGNOSIS — E210 Primary hyperparathyroidism: Secondary | ICD-10-CM

## 2024-01-16 DIAGNOSIS — M8580 Other specified disorders of bone density and structure, unspecified site: Secondary | ICD-10-CM

## 2024-04-10 ENCOUNTER — Encounter: Payer: Self-pay | Admitting: Obstetrics & Gynecology

## 2024-04-10 ENCOUNTER — Inpatient Hospital Stay: Attending: Obstetrics & Gynecology | Admitting: Obstetrics & Gynecology

## 2024-04-10 VITALS — BP 144/70 | HR 88 | Temp 98.1°F | Resp 20 | Ht 64.0 in | Wt 311.0 lb

## 2024-04-10 DIAGNOSIS — Z8542 Personal history of malignant neoplasm of other parts of uterus: Secondary | ICD-10-CM | POA: Diagnosis not present

## 2024-04-10 DIAGNOSIS — C541 Malignant neoplasm of endometrium: Secondary | ICD-10-CM

## 2024-04-10 NOTE — Assessment & Plan Note (Signed)
 68 yo with a h/o Stage IA Gr1 endometrioid endometrial adenocarcinoma Negative symptom review, normal exam.  No evidence of recurrence     >return prn or in 6 mos

## 2024-04-10 NOTE — Progress Notes (Signed)
 Follow Up Note: Gyn-Onc  Stacey Wise 68 y.o. female  CC: She returns for a f/u visit   HPI: The oncology history was reviewed.  Interval History: She denies any vaginal bleeding, abdominal pain, cough, lethargy or increasing abdominal girth.     Review of Systems  Review of Systems  Constitutional:  Negative for malaise/fatigue and weight loss.  Respiratory:  Negative for shortness of breath and wheezing.   Cardiovascular:  Negative for chest pain and leg swelling.  Gastrointestinal:  Negative for abdominal pain, blood in stool, constipation, nausea and vomiting.  Genitourinary:  Negative for dysuria, frequency, hematuria and urgency.  Musculoskeletal:  See above Neurological:  Negative for weakness.  Psychiatric/Behavioral:  See above   Current medications, allergy, social history, past surgical history, past medical history, family history were all reviewed.    Vitals: BP (!) 144/70 Comment: MD and RN notiifed  Pulse 88   Temp 98.1 F (36.7 C) (Oral)   Resp 20   Ht 5' 4 (1.626 m)   Wt (!) 311 lb (141.1 kg)   LMP 06/23/2016   BMI 53.38 kg/m       Physical Exam Exam conducted with a chaperone present.  Constitutional:      General: She is not in acute distress. Cardiovascular:     Rate and Rhythm: Normal rate and regular rhythm.  Pulmonary:     Effort: Pulmonary effort is normal.     Breath sounds: Normal breath sounds. No wheezing or rhonchi.  Abdominal:     Palpations: Abdomen is soft.     Tenderness: There is no abdominal tenderness. There is no right CVA tenderness or left CVA tenderness.     Hernia: No hernia is present.  Genitourinary:    General: Normal vulva.     Urethra: No urethral lesion.     Vagina: No lesions. No bleeding Musculoskeletal:     Cervical back: Neck supple.     Right lower leg: No edema.     Left lower leg: No edema.  Lymphadenopathy:     Upper Body:     Right upper body: No supraclavicular adenopathy.     Left upper body:  No supraclavicular adenopathy.     Lower Body: No right inguinal adenopathy. No left inguinal adenopathy.  Skin:    Findings: No rash.  Neurological:     Mental Status: She is oriented to person, place, and time.   Assessment/Plan:  Endometrial cancer (HCC) 68 yo with a h/o Stage IA Gr1 endometrioid endometrial adenocarcinoma Negative symptom review, normal exam.  No evidence of recurrence     >return prn or in 6 mos      I personally spent 25 minutes face-to-face and non-face-to-face in the care of this patient, which includes all pre, intra, and post visit time on the date of service.    Olam Mill, MD

## 2024-04-10 NOTE — Patient Instructions (Signed)
 Return in 6 months

## 2024-07-01 ENCOUNTER — Other Ambulatory Visit: Payer: Self-pay | Admitting: Radiology

## 2024-07-01 ENCOUNTER — Encounter: Payer: Self-pay | Admitting: Physician Assistant

## 2024-07-01 ENCOUNTER — Other Ambulatory Visit (INDEPENDENT_AMBULATORY_CARE_PROVIDER_SITE_OTHER): Payer: Self-pay

## 2024-07-01 ENCOUNTER — Ambulatory Visit (INDEPENDENT_AMBULATORY_CARE_PROVIDER_SITE_OTHER): Admitting: Physician Assistant

## 2024-07-01 ENCOUNTER — Other Ambulatory Visit: Payer: Self-pay

## 2024-07-01 VITALS — Ht 63.39 in | Wt 308.2 lb

## 2024-07-01 DIAGNOSIS — G8929 Other chronic pain: Secondary | ICD-10-CM

## 2024-07-01 DIAGNOSIS — M5442 Lumbago with sciatica, left side: Secondary | ICD-10-CM

## 2024-07-01 DIAGNOSIS — M25552 Pain in left hip: Secondary | ICD-10-CM | POA: Diagnosis not present

## 2024-07-01 MED ORDER — GABAPENTIN 300 MG PO CAPS: 300.0000 mg | ORAL_CAPSULE | Freq: Every day | ORAL | 1 refills | Status: AC

## 2024-07-01 MED ORDER — CYCLOBENZAPRINE HCL 5 MG PO TABS: 5.0000 mg | ORAL_TABLET | Freq: Every day | ORAL | Status: AC

## 2024-07-01 NOTE — Progress Notes (Signed)
 Office Visit Note   Patient: Stacey Wise           Date of Birth: 05/25/1956           MRN: [id number] Visit Date: 07/01/2024              Requested by: No referring provider defined for this encounter. PCP: Cyrena Gwenn SQUIBB, MD (Inactive)   Assessment & Plan: Visit Diagnoses:  1. Chronic left-sided low back pain with left-sided sciatica   2. Pain in left hip     Plan: Given the fact the patient has failed conservative treatment with medications and hip injection and given her examination today recommend physical therapy.  Therapy before her lumbar spine work on core strengthening, back exercises, stretching, home exercise program and modalities.  Will also place her on Neurontin  and Flexeril to take at bedtime.  Have her follow-up with us  in 6 weeks to see how she is doing overall.  Questions were encouraged and answered.  Follow-Up Instructions: Return in about 6 weeks (around 08/12/2024).   Orders:  Orders Placed This Encounter  Procedures   XR Lumbar Spine 2-3 Views   XR HIP UNILAT W OR W/O PELVIS 2-3 VIEWS LEFT   Meds ordered this encounter  Medications   gabapentin  (NEURONTIN ) 300 MG capsule    Sig: Take 1 capsule (300 mg total) by mouth at bedtime.    Dispense:  30 capsule    Refill:  1   cyclobenzaprine (FLEXERIL) tablet 5 mg      Procedures: No procedures performed   Clinical Data: No additional findings.   Subjective: Chief Complaint  Patient presents with   Left Hip - Pain    HPI Mrs. [name] 68 year old female were seen for the first time for left hip pain, low back pain with radicular symptoms down the left leg.  She states that the hip pain has became worse over the last 2 months.  She was seen at [facility] and had Stacey Wise for her back and her hip.  She reports that she was given an intra-articular injection left hip by Dr. Gifford and that this only gave her 50% relief for about a week.  She does complain of some low groin pain but also  complains of left anterior thigh pain to the left lower leg pain that is over the anterior aspect of the tibia on the lateral aspect of the tibia.  No numbness tingling.  She has tried prednisone Norco, Tylenol  and ibuprofen  without any relief from the pain that she is having in the left leg.  She states the pain is worse with walking and whenever she rolls over on the hip at night.  She states that the pain in the hip will awaken her at night.  No bowel or bladder dysfunction, saddle anesthesia, fevers chills or weight changes.  She has had no therapy.  Does state that she was being scheduled for hip replacement at [facility] and does not want surgery.  Review of Systems See HPI otherwise negative  Objective: Vital Signs: Ht 5' 3.39 (1.61 m)   Wt (!) 308 lb 3.2 oz (139.8 kg)   LMP 06/23/2016   BMI 53.93 kg/m   Physical Exam Constitutional:      Appearance: She is obese. She is not ill-appearing or diaphoretic.  Pulmonary:     Effort: Pulmonary effort is normal.  Neurological:     Mental Status: She is alert and oriented to person, place, and time.  Ortho Exam Bilateral hips good range of motion of both hips no pain with internal rotation of either hip.  Pain in the left hip with external rotation.  Tenderness over the trochanteric region bilateral hips and down the IT bands bilaterally. Lumbar spine: Slight positive straight leg raise on the left negative on the right.  She has limited flexion extension of the lumbar spine.  5 out of 5 strength throughout the lower extremities against resistance bilaterally.  Specialty Comments:  No specialty comments available.  Imaging: XR Lumbar Spine 2-3 Views Result Date: 07/01/2024 Lumbar spine 2 views: No acute fractures.  Disc base overall well-maintained.  Grade 1 L3 on 4 and grade 1 spondylolisthesis at L4-5.  No acute fractures.  Arthrosclerosis aorta.  XR HIP UNILAT W OR W/O PELVIS 2-3 VIEWS LEFT Result Date: 07/01/2024 AP pelvis  lateral view of the left hip: Radiographs quality is degraded secondary to overlying soft tissue.  Bilateral hip joints however appear well-preserved.  No gross fractures.  Hips are located bilaterally.    PMFS History: Patient Active Problem List   Diagnosis Date Noted   Genetic testing 11/30/2020   Family history of peritoneal cancer    Family history of prostate cancer    Endometrial cancer (HCC) 09/10/2020   Postmenopausal bleeding 01/19/2017   Hyperparathyroidism, primary 06/29/2015   Nonspecific abnormal electrocardiogram (ECG) (EKG) 12/13/2013   Post-operative state 01/25/2013   Microcalcifications of the breast 12/24/2012   Hypercalcemia 03/22/2011   Routine general medical examination at a health care facility 02/17/2011   Encounter for long-term (current) use of other medications 02/17/2011   Diabetes (HCC) 02/28/2007   DIABETIC PERIPHERAL NEUROPATHY 02/28/2007   DYSLIPIDEMIA 02/28/2007   Morbid obesity (HCC) 02/28/2007   Essential hypertension 02/28/2007   Sleep apnea 02/28/2007   Past Medical History:  Diagnosis Date   Arthritis    Cancer (HCC)    intrauterine cancer    Complication of anesthesia    hard to wake up from parathyroidectomy   Diabetes mellitus without complication (HCC)    per patient  i am a type 2 diabetic   DIABETIC PERIPHERAL NEUROPATHY 02/28/2007   DYSLIPIDEMIA 02/28/2007   Family history of anesthesia complication    sister hard to wake up   Family history of peritoneal cancer    Family history of prostate cancer    Heel spur, right    Hepatitis    states she has only been vaccinated but never had   HYPERTENSION 02/28/2007   Morbid obesity (HCC) 02/28/2007   Pneumonia    x2   SLEEP APNEA 02/28/2007   does not use a cpap-could not use   Trigger finger, left ring finger    Uterine fibroid     Family History  Problem Relation Age of Onset   Diabetes Mother    Arthritis Mother    Hypertension Mother    Arthritis Father    Dementia  Father    Alzheimer's disease Father    Prostate cancer Father        Prostate   Cancer Sister        Lymphoma   Arthritis Sister    Arthritis Brother    Prostate cancer Brother    Cancer Sister 16       Peritoneal   Arthritis Sister    Arthritis Sister    Arthritis Sister    Arthritis Brother    Diabetes Other        Sibling   Diabetes Other  Sibling   Prostate cancer Paternal Grandfather     Past Surgical History:  Procedure Laterality Date   BREAST LUMPECTOMY WITH NEEDLE LOCALIZATION Left 01/01/2013   Procedure: LEFT BREAST LUMPECTOMY WITH NEEDLE LOCALIZATION;  Surgeon: Debby LABOR. Cornett, MD;  Location: [facility];  Service: General;  Laterality: Left;   CERVICAL BIOPSY  W/ LOOP ELECTRODE EXCISION     CERVICAL POLYPECTOMY N/A 06/27/2016   Procedure: CERVICAL POLYPECTOMY;  Surgeon: Marjorie Gull, MD;  Location: [facility];  Service: Gynecology;  Laterality: N/A;   DILATION AND CURETTAGE OF UTERUS  2009   DILATION AND CURETTAGE OF UTERUS N/A 06/27/2016   Procedure: DILATATION AND CURETTAGE;  Surgeon: Marjorie Gull, MD;  Location: [facility];  Service: Gynecology;  Laterality: N/A;   DILATION AND CURETTAGE OF UTERUS N/A 01/19/2017   Procedure: DILATATION AND CURETTAGE;  Surgeon: Eloy Herring, MD;  Location: [facility];  Service: Gynecology;  Laterality: N/A;   ELECTROCARDIOGRAM  11/19/2004   HYSTEROSCOPY N/A 06/27/2016   Procedure: HYSTEROSCOPY;  Surgeon: Marjorie Gull, MD;  Location: [facility];  Service: Gynecology;  Laterality: N/A;   PARATHYROIDECTOMY Left 06/30/2015   Procedure: LEFT PARATHYROIDECTOMY;  Surgeon: Krystal Spinner, MD;  Location: [facility];  Service: General;  Laterality: Left;   ROBOTIC ASSISTED TOTAL HYSTERECTOMY WITH BILATERAL SALPINGO OOPHERECTOMY Bilateral 10/13/2020   Procedure: XI ROBOTIC ASSISTED TOTAL HYSTERECTOMY WITH BILATERAL SALPINGO OOPHORECTOMY GREATER THAN 250GMS, MINI LAPAROTOMY;  Surgeon: Eloy Herring, MD;  Location: [facility];  Service: Gynecology;   Laterality: Bilateral;   TUBAL LIGATION  1996   Social History   Occupational History   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

## 2024-08-12 ENCOUNTER — Other Ambulatory Visit: Payer: Self-pay

## 2024-08-12 ENCOUNTER — Encounter: Payer: Self-pay | Admitting: Orthopaedic Surgery

## 2024-08-12 ENCOUNTER — Ambulatory Visit: Admitting: Orthopaedic Surgery

## 2024-08-12 DIAGNOSIS — M5442 Lumbago with sciatica, left side: Secondary | ICD-10-CM

## 2024-08-12 DIAGNOSIS — M25552 Pain in left hip: Secondary | ICD-10-CM

## 2024-08-12 DIAGNOSIS — G8929 Other chronic pain: Secondary | ICD-10-CM

## 2024-08-12 MED ORDER — TIZANIDINE HCL 4 MG PO TABS: 4.0000 mg | ORAL_TABLET | Freq: Three times a day (TID) | ORAL | 1 refills | Status: AC | PRN

## 2024-08-12 NOTE — Progress Notes (Signed)
 The patient is a 68 year old who comes in for follow-up as a relates to left-sided low back pain with sciatica and left hip pain.  We had tried to get her set up for outpatient physical therapy and a number was called 3 times but she does let me know that she has a new landline and did give us  that number today.  She also states that a muscle relaxant was never called in for her but the gabapentin  made her too tired feeling.  I did send in tizanidine  to the [facility] in [place] on [address]  We wrote down her main home phone number landline that has answered machine and we will see if we can reinitiate ordering and scheduling outpatient physical therapy for her in [place] to work on any modalities that can decrease her low back pain, her left-sided sciatica and her left hip pain with any modalities per the therapist discretion.  We will then see her back in 6 weeks to see how she is doing overall.

## 2024-09-23 ENCOUNTER — Ambulatory Visit: Admitting: Orthopaedic Surgery

## 2024-09-24 ENCOUNTER — Telehealth: Payer: Self-pay | Admitting: *Deleted

## 2024-09-24 NOTE — Telephone Encounter (Signed)
 Spoke with Stacey Wise who called to schedule her 6 month follow up with Dr. Rogelio. Pt was given an appointment for Wednesday, March 4 th. At 1030. Pt agreed to date and time and thanked the office.

## 2024-10-21 ENCOUNTER — Ambulatory Visit: Admitting: Orthopaedic Surgery

## 2024-10-30 ENCOUNTER — Inpatient Hospital Stay: Admitting: Obstetrics & Gynecology
